# Patient Record
Sex: Male | Born: 1965 | Race: White | Hispanic: No | Smoking: Current every day smoker
Health system: Southern US, Community
[De-identification: ages and names within clinical notes are randomized; demographics above are authoritative.]

## PROBLEM LIST (undated history)

## (undated) ENCOUNTER — Emergency Department (HOSPITAL_BASED_OUTPATIENT_CLINIC_OR_DEPARTMENT_OTHER): Admission: EM | Payer: Medicare Other | Source: Home / Self Care

## (undated) DIAGNOSIS — T50901A Poisoning by unspecified drugs, medicaments and biological substances, accidental (unintentional), initial encounter: Secondary | ICD-10-CM

## (undated) DIAGNOSIS — R7301 Impaired fasting glucose: Secondary | ICD-10-CM

## (undated) DIAGNOSIS — K219 Gastro-esophageal reflux disease without esophagitis: Secondary | ICD-10-CM

## (undated) DIAGNOSIS — S0990XA Unspecified injury of head, initial encounter: Secondary | ICD-10-CM

## (undated) DIAGNOSIS — C342 Malignant neoplasm of middle lobe, bronchus or lung: Secondary | ICD-10-CM

## (undated) DIAGNOSIS — F319 Bipolar disorder, unspecified: Secondary | ICD-10-CM

## (undated) DIAGNOSIS — F32A Depression, unspecified: Secondary | ICD-10-CM

## (undated) DIAGNOSIS — R059 Cough, unspecified: Secondary | ICD-10-CM

## (undated) DIAGNOSIS — E785 Hyperlipidemia, unspecified: Secondary | ICD-10-CM

## (undated) DIAGNOSIS — D649 Anemia, unspecified: Secondary | ICD-10-CM

## (undated) DIAGNOSIS — C3490 Malignant neoplasm of unspecified part of unspecified bronchus or lung: Secondary | ICD-10-CM

## (undated) DIAGNOSIS — F329 Major depressive disorder, single episode, unspecified: Secondary | ICD-10-CM

## (undated) DIAGNOSIS — R05 Cough: Secondary | ICD-10-CM

## (undated) DIAGNOSIS — Z923 Personal history of irradiation: Secondary | ICD-10-CM

## (undated) DIAGNOSIS — J189 Pneumonia, unspecified organism: Secondary | ICD-10-CM

## (undated) DIAGNOSIS — G893 Neoplasm related pain (acute) (chronic): Secondary | ICD-10-CM

## (undated) DIAGNOSIS — C801 Malignant (primary) neoplasm, unspecified: Secondary | ICD-10-CM

## (undated) HISTORY — PX: UPPER GASTROINTESTINAL ENDOSCOPY: SHX188

## (undated) HISTORY — DX: Gastro-esophageal reflux disease without esophagitis: K21.9

## (undated) HISTORY — PX: KNEE SURGERY: SHX244

## (undated) HISTORY — PX: INGUINAL HERNIA REPAIR: SUR1180

## (undated) HISTORY — DX: Personal history of irradiation: Z92.3

## (undated) HISTORY — PX: POLYPECTOMY: SHX149

## (undated) HISTORY — PX: LEG SURGERY: SHX1003

## (undated) HISTORY — DX: Major depressive disorder, single episode, unspecified: F32.9

## (undated) HISTORY — PX: COLONOSCOPY: SHX174

## (undated) HISTORY — DX: Hyperlipidemia, unspecified: E78.5

## (undated) HISTORY — PX: OTHER SURGICAL HISTORY: SHX169

## (undated) HISTORY — DX: Bipolar disorder, unspecified: F31.9

## (undated) HISTORY — PX: BACK SURGERY: SHX140

## (undated) HISTORY — DX: Depression, unspecified: F32.A

## (undated) HISTORY — PX: UMBILICAL HERNIA REPAIR: SHX196

## (undated) HISTORY — PX: CRANIOTOMY: SHX93

---

## 1997-06-09 HISTORY — PX: SUBDURAL HEMATOMA EVACUATION VIA CRANIOTOMY: SUR319

## 1997-11-18 ENCOUNTER — Inpatient Hospital Stay (HOSPITAL_COMMUNITY): Admission: EM | Admit: 1997-11-18 | Discharge: 1997-11-23 | Payer: Self-pay | Admitting: Emergency Medicine

## 2005-04-28 ENCOUNTER — Encounter: Admission: RE | Admit: 2005-04-28 | Discharge: 2005-04-28 | Payer: Self-pay | Admitting: Orthopedic Surgery

## 2005-05-06 ENCOUNTER — Encounter (INDEPENDENT_AMBULATORY_CARE_PROVIDER_SITE_OTHER): Payer: Self-pay | Admitting: Specialist

## 2005-05-06 ENCOUNTER — Ambulatory Visit (HOSPITAL_COMMUNITY): Admission: RE | Admit: 2005-05-06 | Discharge: 2005-05-07 | Payer: Self-pay | Admitting: Orthopedic Surgery

## 2006-04-09 ENCOUNTER — Ambulatory Visit: Payer: Self-pay | Admitting: Internal Medicine

## 2007-01-20 ENCOUNTER — Telehealth (INDEPENDENT_AMBULATORY_CARE_PROVIDER_SITE_OTHER): Payer: Self-pay | Admitting: *Deleted

## 2007-04-02 ENCOUNTER — Encounter: Payer: Self-pay | Admitting: Internal Medicine

## 2007-04-13 ENCOUNTER — Ambulatory Visit: Payer: Self-pay | Admitting: Internal Medicine

## 2007-04-13 DIAGNOSIS — F319 Bipolar disorder, unspecified: Secondary | ICD-10-CM

## 2007-04-13 DIAGNOSIS — K209 Esophagitis, unspecified without bleeding: Secondary | ICD-10-CM | POA: Insufficient documentation

## 2007-04-13 DIAGNOSIS — K439 Ventral hernia without obstruction or gangrene: Secondary | ICD-10-CM | POA: Insufficient documentation

## 2007-04-13 DIAGNOSIS — E8881 Metabolic syndrome: Secondary | ICD-10-CM

## 2007-04-17 LAB — CONVERTED CEMR LAB
Basophils Absolute: 0 10*3/uL (ref 0.0–0.1)
Basophils Relative: 0 % (ref 0.0–1.0)
Eosinophils Absolute: 0.2 10*3/uL (ref 0.0–0.6)
Eosinophils Relative: 2 % (ref 0.0–5.0)
HCT: 41 % (ref 39.0–52.0)
Hemoglobin: 14.2 g/dL (ref 13.0–17.0)
Hgb A1c MFr Bld: 5.6 % (ref 4.6–6.0)
MCHC: 34.6 g/dL (ref 30.0–36.0)
MCV: 96.1 fL (ref 78.0–100.0)
Monocytes Absolute: 0.4 10*3/uL (ref 0.2–0.7)
Monocytes Relative: 4.2 % (ref 3.0–11.0)
Neutro Abs: 6.8 10*3/uL (ref 1.4–7.7)
Neutrophils Relative %: 73.9 % (ref 43.0–77.0)
Platelets: 240 10*3/uL (ref 150–400)
RBC: 4.26 M/uL (ref 4.22–5.81)
RDW: 14.1 % (ref 11.5–14.6)
WBC: 9.3 10*3/uL (ref 4.5–10.5)

## 2007-04-19 ENCOUNTER — Encounter (INDEPENDENT_AMBULATORY_CARE_PROVIDER_SITE_OTHER): Payer: Self-pay | Admitting: *Deleted

## 2007-04-30 ENCOUNTER — Ambulatory Visit: Payer: Self-pay | Admitting: Gastroenterology

## 2007-05-10 ENCOUNTER — Ambulatory Visit: Payer: Self-pay | Admitting: Gastroenterology

## 2007-05-20 ENCOUNTER — Ambulatory Visit: Payer: Self-pay | Admitting: Gastroenterology

## 2007-05-25 ENCOUNTER — Ambulatory Visit: Payer: Self-pay | Admitting: Gastroenterology

## 2007-06-22 ENCOUNTER — Ambulatory Visit: Payer: Self-pay | Admitting: Gastroenterology

## 2007-06-22 ENCOUNTER — Encounter: Payer: Self-pay | Admitting: Gastroenterology

## 2007-07-05 ENCOUNTER — Ambulatory Visit: Payer: Self-pay | Admitting: Gastroenterology

## 2007-07-15 ENCOUNTER — Ambulatory Visit: Payer: Self-pay | Admitting: Internal Medicine

## 2007-07-23 ENCOUNTER — Encounter (INDEPENDENT_AMBULATORY_CARE_PROVIDER_SITE_OTHER): Payer: Self-pay | Admitting: *Deleted

## 2007-07-23 LAB — CONVERTED CEMR LAB
Direct LDL: 144 mg/dL
HDL: 25.9 mg/dL — ABNORMAL LOW (ref >39.0)
Total CHOL/HDL Ratio: 8.6
Triglycerides: 294 mg/dL (ref 0–149)
VLDL: 59 mg/dL — ABNORMAL HIGH (ref 0–40)

## 2007-08-20 ENCOUNTER — Ambulatory Visit: Payer: Self-pay | Admitting: Gastroenterology

## 2007-08-25 ENCOUNTER — Encounter: Payer: Self-pay | Admitting: Internal Medicine

## 2007-08-26 ENCOUNTER — Ambulatory Visit: Payer: Self-pay | Admitting: Internal Medicine

## 2007-08-26 DIAGNOSIS — E785 Hyperlipidemia, unspecified: Secondary | ICD-10-CM | POA: Insufficient documentation

## 2007-09-01 ENCOUNTER — Telehealth: Payer: Self-pay | Admitting: Internal Medicine

## 2007-09-01 ENCOUNTER — Ambulatory Visit: Payer: Self-pay | Admitting: Cardiology

## 2007-09-02 ENCOUNTER — Encounter (INDEPENDENT_AMBULATORY_CARE_PROVIDER_SITE_OTHER): Payer: Self-pay | Admitting: *Deleted

## 2007-09-03 ENCOUNTER — Ambulatory Visit: Payer: Self-pay

## 2007-09-03 ENCOUNTER — Encounter: Payer: Self-pay | Admitting: Internal Medicine

## 2007-09-23 ENCOUNTER — Ambulatory Visit: Payer: Self-pay | Admitting: Internal Medicine

## 2007-09-23 ENCOUNTER — Encounter (INDEPENDENT_AMBULATORY_CARE_PROVIDER_SITE_OTHER): Payer: Self-pay | Admitting: *Deleted

## 2007-09-23 LAB — CONVERTED CEMR LAB: OCCULT 2: NEGATIVE

## 2007-10-12 ENCOUNTER — Ambulatory Visit: Payer: Self-pay | Admitting: Internal Medicine

## 2007-10-12 DIAGNOSIS — M722 Plantar fascial fibromatosis: Secondary | ICD-10-CM

## 2007-10-12 DIAGNOSIS — E781 Pure hyperglyceridemia: Secondary | ICD-10-CM

## 2007-10-13 ENCOUNTER — Encounter: Payer: Self-pay | Admitting: Internal Medicine

## 2007-10-19 ENCOUNTER — Encounter: Payer: Self-pay | Admitting: Internal Medicine

## 2007-10-20 ENCOUNTER — Telehealth (INDEPENDENT_AMBULATORY_CARE_PROVIDER_SITE_OTHER): Payer: Self-pay | Admitting: *Deleted

## 2007-11-15 ENCOUNTER — Telehealth (INDEPENDENT_AMBULATORY_CARE_PROVIDER_SITE_OTHER): Payer: Self-pay | Admitting: *Deleted

## 2008-02-09 ENCOUNTER — Telehealth (INDEPENDENT_AMBULATORY_CARE_PROVIDER_SITE_OTHER): Payer: Self-pay | Admitting: *Deleted

## 2008-02-15 ENCOUNTER — Telehealth (INDEPENDENT_AMBULATORY_CARE_PROVIDER_SITE_OTHER): Payer: Self-pay | Admitting: *Deleted

## 2008-05-09 ENCOUNTER — Telehealth (INDEPENDENT_AMBULATORY_CARE_PROVIDER_SITE_OTHER): Payer: Self-pay | Admitting: *Deleted

## 2008-05-19 ENCOUNTER — Telehealth (INDEPENDENT_AMBULATORY_CARE_PROVIDER_SITE_OTHER): Payer: Self-pay | Admitting: *Deleted

## 2008-07-25 ENCOUNTER — Ambulatory Visit: Payer: Self-pay | Admitting: Gastroenterology

## 2008-08-08 ENCOUNTER — Ambulatory Visit: Payer: Self-pay | Admitting: Gastroenterology

## 2008-08-08 ENCOUNTER — Encounter: Payer: Self-pay | Admitting: Gastroenterology

## 2008-08-09 ENCOUNTER — Telehealth: Payer: Self-pay | Admitting: Gastroenterology

## 2008-08-11 ENCOUNTER — Encounter: Payer: Self-pay | Admitting: Gastroenterology

## 2008-08-18 ENCOUNTER — Telehealth (INDEPENDENT_AMBULATORY_CARE_PROVIDER_SITE_OTHER): Payer: Self-pay | Admitting: *Deleted

## 2008-08-21 ENCOUNTER — Encounter: Payer: Self-pay | Admitting: Gastroenterology

## 2008-08-28 ENCOUNTER — Telehealth (INDEPENDENT_AMBULATORY_CARE_PROVIDER_SITE_OTHER): Payer: Self-pay | Admitting: *Deleted

## 2008-08-30 ENCOUNTER — Ambulatory Visit: Payer: Self-pay | Admitting: Internal Medicine

## 2008-08-30 DIAGNOSIS — R05 Cough: Secondary | ICD-10-CM | POA: Insufficient documentation

## 2008-08-30 DIAGNOSIS — K2270 Barrett's esophagus without dysplasia: Secondary | ICD-10-CM

## 2008-09-06 ENCOUNTER — Encounter (INDEPENDENT_AMBULATORY_CARE_PROVIDER_SITE_OTHER): Payer: Self-pay | Admitting: *Deleted

## 2008-09-11 ENCOUNTER — Ambulatory Visit: Payer: Self-pay | Admitting: Internal Medicine

## 2008-09-13 ENCOUNTER — Encounter (INDEPENDENT_AMBULATORY_CARE_PROVIDER_SITE_OTHER): Payer: Self-pay | Admitting: *Deleted

## 2008-10-23 ENCOUNTER — Telehealth (INDEPENDENT_AMBULATORY_CARE_PROVIDER_SITE_OTHER): Payer: Self-pay | Admitting: *Deleted

## 2008-10-25 ENCOUNTER — Ambulatory Visit: Payer: Self-pay | Admitting: Internal Medicine

## 2008-10-25 DIAGNOSIS — R498 Other voice and resonance disorders: Secondary | ICD-10-CM | POA: Insufficient documentation

## 2008-10-25 DIAGNOSIS — I479 Paroxysmal tachycardia, unspecified: Secondary | ICD-10-CM

## 2008-10-26 LAB — CONVERTED CEMR LAB: TSH: 1.16 microintl units/mL (ref 0.35–5.50)

## 2008-10-27 ENCOUNTER — Encounter (INDEPENDENT_AMBULATORY_CARE_PROVIDER_SITE_OTHER): Payer: Self-pay | Admitting: *Deleted

## 2008-10-30 ENCOUNTER — Encounter: Payer: Self-pay | Admitting: Internal Medicine

## 2008-11-02 ENCOUNTER — Telehealth (INDEPENDENT_AMBULATORY_CARE_PROVIDER_SITE_OTHER): Payer: Self-pay | Admitting: *Deleted

## 2008-11-04 ENCOUNTER — Encounter (INDEPENDENT_AMBULATORY_CARE_PROVIDER_SITE_OTHER): Payer: Self-pay | Admitting: *Deleted

## 2008-11-04 LAB — CONVERTED CEMR LAB
Catecholamines Tot(E+NE) 24 Hr U: 0.069 mg/24hr
Dopamine 24 Hr Urine: 299 mcg/24hr (ref <500)
Epinephrine 24 Hr Urine: 3 mcg/24hr (ref <20)
Metaneph Total, Ur: 716 ug/24hr (ref 182–739)
Metanephrines, Ur: 145 (ref 58–203)
Norepinephrine 24 Hr Urine: 66 mcg/24hr (ref <80)
Normetanephrine, 24H Ur: 571 (ref 88–649)

## 2008-12-08 ENCOUNTER — Ambulatory Visit: Payer: Self-pay | Admitting: Internal Medicine

## 2008-12-16 LAB — CONVERTED CEMR LAB
Cholesterol: 248 mg/dL — ABNORMAL HIGH (ref 0–200)
Direct LDL: 148.6 mg/dL
HDL: 28.5 mg/dL — ABNORMAL LOW (ref >39.00)
Total CHOL/HDL Ratio: 9
Triglycerides: 407 mg/dL — ABNORMAL HIGH (ref 0.0–149.0)
VLDL: 81.4 mg/dL — ABNORMAL HIGH (ref 0.0–40.0)

## 2008-12-18 ENCOUNTER — Ambulatory Visit: Payer: Self-pay | Admitting: Gastroenterology

## 2008-12-18 DIAGNOSIS — A630 Anogenital (venereal) warts: Secondary | ICD-10-CM

## 2008-12-18 DIAGNOSIS — K432 Incisional hernia without obstruction or gangrene: Secondary | ICD-10-CM | POA: Insufficient documentation

## 2008-12-18 DIAGNOSIS — K648 Other hemorrhoids: Secondary | ICD-10-CM | POA: Insufficient documentation

## 2008-12-19 ENCOUNTER — Encounter (INDEPENDENT_AMBULATORY_CARE_PROVIDER_SITE_OTHER): Payer: Self-pay | Admitting: *Deleted

## 2008-12-27 ENCOUNTER — Telehealth: Payer: Self-pay | Admitting: Gastroenterology

## 2009-01-08 ENCOUNTER — Telehealth: Payer: Self-pay | Admitting: Gastroenterology

## 2009-01-24 ENCOUNTER — Encounter: Payer: Self-pay | Admitting: Gastroenterology

## 2009-01-31 ENCOUNTER — Ambulatory Visit: Payer: Self-pay | Admitting: Internal Medicine

## 2009-01-31 DIAGNOSIS — T887XXA Unspecified adverse effect of drug or medicament, initial encounter: Secondary | ICD-10-CM | POA: Insufficient documentation

## 2009-02-13 ENCOUNTER — Encounter (INDEPENDENT_AMBULATORY_CARE_PROVIDER_SITE_OTHER): Payer: Self-pay | Admitting: *Deleted

## 2009-02-13 LAB — CONVERTED CEMR LAB
BUN: 13 mg/dL (ref 6–23)
Creatinine, Ser: 1.2 mg/dL (ref 0.4–1.5)
Creatinine,U: 181.5 mg/dL
Hgb A1c MFr Bld: 6.2 % (ref 4.6–6.5)
Microalb Creat Ratio: 1.1 mg/g (ref 0.0–30.0)
Microalb, Ur: 0.2 mg/dL (ref 0.0–1.9)
Potassium: 4 meq/L (ref 3.5–5.1)

## 2009-02-21 ENCOUNTER — Encounter: Payer: Self-pay | Admitting: Internal Medicine

## 2009-02-27 ENCOUNTER — Encounter: Payer: Self-pay | Admitting: Gastroenterology

## 2009-03-08 ENCOUNTER — Encounter: Payer: Self-pay | Admitting: Gastroenterology

## 2009-03-15 ENCOUNTER — Ambulatory Visit (HOSPITAL_COMMUNITY): Admission: RE | Admit: 2009-03-15 | Discharge: 2009-03-15 | Payer: Self-pay | Admitting: General Surgery

## 2009-03-15 ENCOUNTER — Encounter (INDEPENDENT_AMBULATORY_CARE_PROVIDER_SITE_OTHER): Payer: Self-pay | Admitting: General Surgery

## 2009-03-17 ENCOUNTER — Emergency Department (HOSPITAL_COMMUNITY): Admission: EM | Admit: 2009-03-17 | Discharge: 2009-03-17 | Payer: Self-pay | Admitting: Emergency Medicine

## 2009-03-23 ENCOUNTER — Encounter: Payer: Self-pay | Admitting: Internal Medicine

## 2009-03-28 ENCOUNTER — Encounter: Payer: Self-pay | Admitting: Internal Medicine

## 2009-04-09 ENCOUNTER — Telehealth (INDEPENDENT_AMBULATORY_CARE_PROVIDER_SITE_OTHER): Payer: Self-pay | Admitting: *Deleted

## 2009-05-10 ENCOUNTER — Telehealth (INDEPENDENT_AMBULATORY_CARE_PROVIDER_SITE_OTHER): Payer: Self-pay | Admitting: *Deleted

## 2009-05-30 ENCOUNTER — Encounter: Payer: Self-pay | Admitting: Internal Medicine

## 2009-06-29 ENCOUNTER — Ambulatory Visit (HOSPITAL_COMMUNITY): Admission: RE | Admit: 2009-06-29 | Discharge: 2009-07-03 | Payer: Self-pay | Admitting: General Surgery

## 2009-07-10 ENCOUNTER — Telehealth (INDEPENDENT_AMBULATORY_CARE_PROVIDER_SITE_OTHER): Payer: Self-pay | Admitting: *Deleted

## 2009-07-10 ENCOUNTER — Telehealth: Payer: Self-pay | Admitting: Internal Medicine

## 2009-07-23 ENCOUNTER — Telehealth (INDEPENDENT_AMBULATORY_CARE_PROVIDER_SITE_OTHER): Payer: Self-pay | Admitting: *Deleted

## 2009-07-25 ENCOUNTER — Encounter: Payer: Self-pay | Admitting: Gastroenterology

## 2009-07-30 ENCOUNTER — Telehealth: Payer: Self-pay | Admitting: Gastroenterology

## 2009-08-14 ENCOUNTER — Ambulatory Visit: Payer: Self-pay | Admitting: Internal Medicine

## 2009-08-15 ENCOUNTER — Encounter: Payer: Self-pay | Admitting: Gastroenterology

## 2009-08-23 LAB — CONVERTED CEMR LAB: Hgb A1c MFr Bld: 5.9 % (ref 4.6–6.5)

## 2009-10-12 ENCOUNTER — Telehealth (INDEPENDENT_AMBULATORY_CARE_PROVIDER_SITE_OTHER): Payer: Self-pay | Admitting: *Deleted

## 2010-01-09 ENCOUNTER — Telehealth (INDEPENDENT_AMBULATORY_CARE_PROVIDER_SITE_OTHER): Payer: Self-pay | Admitting: *Deleted

## 2010-03-04 ENCOUNTER — Telehealth: Payer: Self-pay | Admitting: Gastroenterology

## 2010-03-25 ENCOUNTER — Telehealth: Payer: Self-pay | Admitting: Gastroenterology

## 2010-04-03 ENCOUNTER — Encounter: Payer: Self-pay | Admitting: Gastroenterology

## 2010-04-16 ENCOUNTER — Telehealth: Payer: Self-pay | Admitting: Internal Medicine

## 2010-04-16 ENCOUNTER — Telehealth: Payer: Self-pay | Admitting: Gastroenterology

## 2010-04-16 ENCOUNTER — Encounter: Payer: Self-pay | Admitting: Internal Medicine

## 2010-04-26 ENCOUNTER — Telehealth (INDEPENDENT_AMBULATORY_CARE_PROVIDER_SITE_OTHER): Payer: Self-pay | Admitting: *Deleted

## 2010-07-07 LAB — CONVERTED CEMR LAB
ALT: 60 units/L — ABNORMAL HIGH (ref 0–53)
AST: 36 units/L (ref 0–37)
Albumin: 4.1 g/dL (ref 3.5–5.2)
Alkaline Phosphatase: 64 units/L (ref 39–117)
Bilirubin, Direct: 0.1 mg/dL (ref 0.0–0.3)
Cholesterol: 246 mg/dL — ABNORMAL HIGH (ref 0–200)
Direct LDL: 145.6 mg/dL
HDL: 33 mg/dL — ABNORMAL LOW (ref >39.00)
Hgb A1c MFr Bld: 6.1 % (ref 4.6–6.5)
TSH: 1.27 microintl units/mL (ref 0.35–5.50)
Total Bilirubin: 0.7 mg/dL (ref 0.3–1.2)
Total CHOL/HDL Ratio: 7
Total Protein: 7.4 g/dL (ref 6.0–8.3)
Triglycerides: 423 mg/dL — ABNORMAL HIGH (ref 0.0–149.0)
VLDL: 84.6 mg/dL — ABNORMAL HIGH (ref 0.0–40.0)

## 2010-07-09 NOTE — Progress Notes (Signed)
Summary: Tricor refill  Phone Note Call from Patient Call back at Home Phone [phone]   Caller: Mother--[name] Summary of Call: Patient mother left message on triage requesting refill of patient med--Tricor. Last refill called for by Chrae on 10-12-09. Please advise. Initial call taken by: Lucious Groves CMA,  January 09, 2010 4:51 PM  Follow-up for Phone Call        OK X 90 days Follow-up by: Marga Melnick MD,  January 09, 2010 4:56 PM  Additional Follow-up for Phone Call Additional follow up Details #1::        Called [phone] and requested refill through patient assistance. Meds will be sent to our office in 7-10 business days Additional Follow-up by: Shonna Chock CMA,  January 10, 2010 8:28 AM     Appended Document: Tricor refill Meds placed at the front for pick-up, patient aware

## 2010-07-09 NOTE — Progress Notes (Signed)
Summary: Medication refill   Phone Note Call from Patient Call back at Home Phone [phone]   Caller: Patient Call For: Dr. Arlyce Dice Reason for Call: Refill Medication Summary of Call: Pt needs a refill on his Dexilant Initial call taken by: Karna Christmas,  July 30, 2009 11:38 AM  Follow-up for Phone Call        Called pt to inform sent in rx Follow-up by: Merri Ray CMA Duncan Dull),  July 30, 2009 1:17 PM    Prescriptions: DEXILANT 60 MG CPDR (DEXLANSOPRAZOLE) 1 by mouth once daily 30 min before first meal  #30 x 6   Entered by:   Merri Ray CMA (AAMA)   Authorized by:   Louis Meckel MD   Signed by:   Merri Ray CMA (AAMA) on 07/30/2009   Method used:   Electronically to        [facility]  [address] [address] (retail)       [address]       [place], [place]  [address]       Ph: [phone]       Fax: [phone]   RxID:   [id number]

## 2010-07-09 NOTE — Medication Information (Signed)
Summary: [facility] Patient Assistance Program  [facility] Patient Assistance Program   Imported By: Lester Oxford 04/08/2010 08:53:16  _____________________________________________________________________  External Attachment:    Type:   Image     Comment:   External Document

## 2010-07-09 NOTE — Progress Notes (Signed)
Summary: Medication   Phone Note Call from Patient Call back at Home Phone [phone]   Caller: Mother [name] Call For: Dr. Arlyce Dice Summary of Call: Needs a script for Dexilant...Marland KitchenMarland KitchenFax# [phone] Attn: Processing Dept. Initial call taken by: Karna Christmas,  April 16, 2010 8:52 AM  Follow-up for Phone Call        Will fax to PAP, the number provided Follow-up by: Merri Ray CMA Duncan Dull),  April 16, 2010 9:07 AM  Additional Follow-up for Phone Call Additional follow up Details #1::        Called pt, aware Med faxed    Prescriptions: DEXILANT 60 MG CPDR (DEXLANSOPRAZOLE) 1 by mouth once daily 30 min before first meal  #90 x 4   Entered by:   Merri Ray CMA (AAMA)   Authorized by:   Louis Meckel MD   Signed by:   Merri Ray CMA (AAMA) on 04/16/2010   Method used:   Printed then faxed to ...       [facility]  [address] (retail)       [address]       [place], [place]  [address]       Ph: [phone]       Fax: [phone]   RxID:   [id number]

## 2010-07-09 NOTE — Letter (Signed)
Summary: [facility]   Imported By: Sherian Rein 10/08/2009 13:13:16  _____________________________________________________________________  External Attachment:    Type:   Image     Comment:   External Document

## 2010-07-09 NOTE — Progress Notes (Signed)
Summary: PAP [employer]   Phone Note Call from Patient   Summary of Call: Pt came by and dropped off paperwork for PAP for Dr Arlyce Dice to sign will mail to [name] in self addressed envelope as soon as its signed Initial call taken by: Merri Ray CMA Duncan Dull),  March 25, 2010 1:59 PM

## 2010-07-09 NOTE — Progress Notes (Signed)
Summary: Pt assistance meds  Phone Note Other Incoming   Summary of Call: Patient [name] [name] has arrived from patient assistance. I called to notify the patient mother and line was busy. Lucious Groves CMA  April 26, 2010 1:06 PM   Same as above. Lucious Groves CMA  April 26, 2010 1:28 PM   Follow-up for Phone Call        Patient's mom aware Tricor avaliable for pick up Follow-up by: Shonna Chock CMA,  April 26, 2010 1:40 PM

## 2010-07-09 NOTE — Progress Notes (Signed)
Summary: QUESTION ABOUT TRICOR  Phone Note Call from Patient   Caller: Patient Summary of Call: FATHER WANTS TO KNOW IF THERE IS ANYWAY THAT HIS SONS TRICOR CAN BE MAILED TO HIM INSTEAD OF HIM HAVING TO COME AND PICK IT UP BECAUSE ITS A 30 MIN DRIVE. Initial call taken by: Lavell Islam,  April 26, 2010 3:52 PM  Follow-up for Phone Call        spoke w/ patient father informed that no problem mailing medication........Marland KitchenDoristine Devoid CMA  April 26, 2010 5:07 PM

## 2010-07-09 NOTE — Medication Information (Signed)
Summary: Patient Assistance Form/[employer]  Patient Assistance Form/[facility]   Imported By: Lanelle Bal 04/29/2010 11:26:27  _____________________________________________________________________  External Attachment:    Type:   Image     Comment:   External Document

## 2010-07-09 NOTE — Letter (Signed)
Summary: Psychological Eval/[facility]  Psychological Eval/[facility]   Imported By: Lanelle Bal 07/02/2009 10:11:47  _____________________________________________________________________  External Attachment:    Type:   Image     Comment:   External Document

## 2010-07-09 NOTE — Progress Notes (Signed)
Summary: FORM FROM [facility] [employer]  Phone Note Call from Patient Call back at Home Phone [phone]   Caller: [facility] Summary of Call: PATIENT'S MOTHER [facility] BROUGHT IN FORM FROM [facility] LAW GROUP FOR DR Chantae Soo TO COMPLETE SINCE PATIENT'S DISABILITY CLAIM WAS DENIED--WILL TAKE BACK TO [place]IA IN PLASTIC SLEEVE  WHEN COMPLETED, PLEASE FAX TO Lisabeth Pick [phone] PER [name] Grill   Initial call taken by: Jerolyn Shin,  July 10, 2009 2:31 PM  Follow-up for Phone Call        form placed on ledge to be completed...............Marland KitchenFelecia Deloach CMA  July 10, 2009 4:30 PM   Additional Follow-up for Phone Call Additional follow up Details #1::        left up front Additional Follow-up by: Marga Melnick MD,  July 10, 2009 6:01 PM

## 2010-07-09 NOTE — Progress Notes (Signed)
Summary: ORDER TRICOR FROM [facility]  Phone Note Call from Patient Call back at Home Phone [phone]   Caller: MOM = [name] Summary of Call: MOM [facility]  SAID THAT PATIENT NEEDS TO HAVE HIS TRICOR ORDERED FROM [facility]--WHEN WE RECEIVE IT Dianah Field TO PICK IT UP AT [phone] Initial call taken by: Jerolyn Shin,  July 10, 2009 2:32 PM  Follow-up for Phone Call        Called [phone] [facility] Patient Assistance Number to order Ticor through the automatic system. Recording stated med should be delivered in 7-10 business days Follow-up by: Shonna Chock,  July 11, 2009 8:34 AM     Appended Document: ORDER TRICOR FROM [facility] Medication came in # 90, placed at the front.

## 2010-07-09 NOTE — Letter (Signed)
Summary: [facility]   Imported By: Sherian Rein 08/08/2009 12:26:34  _____________________________________________________________________  External Attachment:    Type:   Image     Comment:   External Document

## 2010-07-09 NOTE — Progress Notes (Signed)
Summary: Records request from [facility]  Request for records received from [facility]. Request forwarded to [employer]. Dena Chavis  July 23, 2009 1:02 PM

## 2010-07-09 NOTE — Progress Notes (Signed)
Summary: Refill  Medications Added DEXILANT 60 MG CPDR (DEXLANSOPRAZOLE) 1 by mouth once daily 30 min before first meal       Phone Note Call from Patient Call back at Work Phone [phone]   Call For: Dr Arlyce Dice Reason for Call: Refill Medication Summary of Call: Needs his dexilant refill sent to his house.  Unsure which pharmacy he uses-whatever was the last one we sent it to is ok.  Initial call taken by: Leanor Kail [facility],  March 04, 2010 10:48 AM  Follow-up for Phone Call        Called pt needs to rx mailed to his home so he can fill out his new paperwork for [employer] PAP for his medication. Follow-up by: Merri Ray CMA Duncan Dull),  March 04, 2010 1:33 PM    New/Updated Medications: DEXILANT 60 MG CPDR (DEXLANSOPRAZOLE) 1 by mouth once daily 30 min before first meal Prescriptions: DEXILANT 60 MG CPDR (DEXLANSOPRAZOLE) 1 by mouth once daily 30 min before first meal  #90 x 4   Entered by:   Merri Ray CMA (AAMA)   Authorized by:   Louis Meckel MD   Signed by:   Merri Ray CMA (AAMA) on 03/04/2010   Method used:   Print then Give to Patient   RxID:   [id number]

## 2010-07-09 NOTE — Progress Notes (Signed)
Summary: Refill Request  Phone Note Call from Patient Call back at Home Phone [phone]   Caller: Patient Summary of Call: Message left on VM: Needs Tricor through [facility]   Called  [facility]:  Requested refill throgh automatic system ([phone]), recording stated med will be shipped in 7-10 days./[facility]  Oct 12, 2009 1:16 PM

## 2010-07-09 NOTE — Progress Notes (Signed)
Summary: Tricor pt assist.  Phone Note Call from Patient Call back at Home Phone [phone]   Caller: Mother--[name] Summary of Call: Pt mother left message on triage requesting that we call [facility] Patient Assistance for pt Tricor refill. Called [phone] option 3, then option 2 and requested Tricor refill.  Automated system cannot refill due to patient having used maximum refills/application expired. New app will be faxed to our office at [phone]. Initial call taken by: Lucious Groves CMA,  April 16, 2010 9:48 AM  Follow-up for Phone Call        Patient mother notified of the above and will come to our office to complete their portion of the app with patient proof of no income. MD has completed his portion. Follow-up by: Lucious Groves CMA,  April 16, 2010 11:55 AM    Prescriptions: TRICOR 145 MG  TABS (FENOFIBRATE) TAKE 1 DAILY AT NIGHT  #90 x 3   Entered by:   Lucious Groves CMA   Authorized by:   Marga Melnick MD   Signed by:   Lucious Groves CMA on 04/16/2010   Method used:   Print then Give to Patient   RxID:   [id number]   Appended Document: Tricor pt assist. Mother did finish paperwork and brought proper attachment, forms faxed on patient behalf.

## 2010-07-16 ENCOUNTER — Telehealth (INDEPENDENT_AMBULATORY_CARE_PROVIDER_SITE_OTHER): Payer: Self-pay | Admitting: *Deleted

## 2010-07-17 ENCOUNTER — Telehealth (INDEPENDENT_AMBULATORY_CARE_PROVIDER_SITE_OTHER): Payer: Self-pay | Admitting: *Deleted

## 2010-07-22 ENCOUNTER — Other Ambulatory Visit: Payer: Self-pay | Admitting: Family Medicine

## 2010-07-22 ENCOUNTER — Other Ambulatory Visit (INDEPENDENT_AMBULATORY_CARE_PROVIDER_SITE_OTHER): Payer: Self-pay

## 2010-07-22 ENCOUNTER — Encounter (INDEPENDENT_AMBULATORY_CARE_PROVIDER_SITE_OTHER): Payer: Self-pay | Admitting: *Deleted

## 2010-07-22 DIAGNOSIS — E785 Hyperlipidemia, unspecified: Secondary | ICD-10-CM

## 2010-07-22 DIAGNOSIS — T887XXA Unspecified adverse effect of drug or medicament, initial encounter: Secondary | ICD-10-CM

## 2010-07-22 LAB — LIPID PANEL
Cholesterol: 233 mg/dL — ABNORMAL HIGH (ref 0–200)
HDL: 26 mg/dL — ABNORMAL LOW (ref >39.00)
Total CHOL/HDL Ratio: 9
Triglycerides: 493 mg/dL — ABNORMAL HIGH (ref 0.0–149.0)
VLDL: 98.6 mg/dL — ABNORMAL HIGH (ref 0.0–40.0)

## 2010-07-22 LAB — BUN: BUN: 14 mg/dL (ref 6–23)

## 2010-07-22 LAB — HEMOGLOBIN A1C: Hgb A1c MFr Bld: 5.9 % (ref 4.6–6.5)

## 2010-07-22 LAB — POTASSIUM: Potassium: 4 mEq/L (ref 3.5–5.1)

## 2010-07-22 LAB — LDL CHOLESTEROL, DIRECT: Direct LDL: 138.5 mg/dL

## 2010-07-25 NOTE — Progress Notes (Signed)
Summary: need orders and codes for 2/13--added, sched f/up  Phone Note Call from Patient   Caller: patient's mom Summary of Call: patient's mom says she spoke to Dr [name] and was told to set up a lab to check sugar---(lab work was run earlier by Dr Thomasena Edis at [facility] and she was told his Seraquel could cause sugar to rise)------Does he need just the A1C test or does Dr [name] want more tests and I will need diag codes for all orders please        thanks Initial call taken by: Jerolyn Shin,  July 17, 2010 11:53 AM  Follow-up for Phone Call        fasting lipids, hepatic pattern ,BUN,creat, K+,A1c ( 272.4, 995.20); appt 2-3 days post labs Follow-up by: Marga Melnick MD,  July 17, 2010 3:51 PM  Additional Follow-up for Phone Call Additional follow up Details #1::        entered info in lab appt, scheduled o/v for following Monday 2/20 Additional Follow-up by: Jerolyn Shin,  July 19, 2010 3:42 PM

## 2010-07-25 NOTE — Progress Notes (Addendum)
Summary: Tricor refill  Phone Note Refill Request Message from:  Patient's mom = Maralyn Sago on July 16, 2010 12:16 PM  Refills Requested: Medication #1:  TRICOR 145 MG  TABS TAKE 1 DAILY AT Marissa Calamity says we order this for patient, it arrives here and we call her to pick it up ---call [phone]   (?? see phone note = 11/18)  Initial call taken by: Jerolyn Shin,  July 16, 2010 12:17 PM  Follow-up for Phone Call        I called [phone] and requested refill Follow-up by: Shonna Chock CMA,  July 16, 2010 2:46 PM     Appended Document: Tricor refill Patient's mother aware med is here and ready for pick-up

## 2010-07-29 ENCOUNTER — Ambulatory Visit (INDEPENDENT_AMBULATORY_CARE_PROVIDER_SITE_OTHER): Payer: Self-pay | Admitting: Internal Medicine

## 2010-07-29 ENCOUNTER — Encounter: Payer: Self-pay | Admitting: Internal Medicine

## 2010-07-29 DIAGNOSIS — E781 Pure hyperglyceridemia: Secondary | ICD-10-CM

## 2010-08-06 NOTE — Assessment & Plan Note (Signed)
Summary: to discuss labs///sph   Vital Signs:  Patient profile:   45 year old male Weight:      227.6 pounds BMI:     32.78 Pulse rate:   76 / minute Resp:     15 per minute BP sitting:   102 / 68  (left arm) Cuff size:   large  Vitals Entered By: Shonna Chock CMA (July 29, 2010 8:00 AM) CC: Discuss labs (copy given)    Primary Care Provider:  Marga Melnick, MD  CC:  Discuss labs (copy given) .  History of Present Illness:    Labs reviewed & risks discussed; major immediate risk is Pancreatitis from TG 493. Role of HFCS sugar in raising TG discussed. He reports fatigue, but denies muscle aches, GI upset, abdominal pain, constipation, and diarrhea.  The patient denies the following symptoms: chest pain/pressure, exercise intolerance, dypsnea, palpitations, and pedal edema.  Compliance with medications (by patient report) has been near 100%.  Dietary compliance has been good except for [employer] sodas.  The patient reports no exercise. "Dr Thomasena Edis is taking me off Seroquel because of the TGs".   Current Medications (verified): 1)  Seroquel 400 Mg  Tabs (Quetiapine Fumarate) .Marland Kitchen.. 1 By Mouth Once Daily  (Patient States He Takes 500mg  Daily) 2)  Tricor 145 Mg  Tabs (Fenofibrate) .... Take 1 Daily At Night 3)  Trileptal 300 Mg Tabs (Oxcarbazepine) .... Take 1 Tablet By Two Times A Day 4)  Dexilant 60 Mg Cpdr (Dexlansoprazole) .Marland Kitchen.. 1 By Mouth Once Daily 30 Min Before First Meal  Allergies: 1)  ! Codeine 2)  ! Morphine  Physical Exam  General:  in no acute distress; alert,appropriate and cooperative throughout examination Lungs:  Normal respiratory effort, chest expands symmetrically. Lungs are clear to auscultation, no crackles or wheezes. Heart:  Normal rate and regular rhythm. S1 and S2 normal without gallop, murmur, click, rub . S4 Abdomen:  Bowel sounds positive,abdomen soft and non-tender without masses, organomegaly . Abdomen protuberant with small incisonal hernia  noted. Psych:  memory intact for recent and remote, normally interactive, good eye contact, not anxious appearing, and not depressed appearing.     Impression & Recommendations:  Problem # 1:  HYPERTRIGLYCERIDEMIA (ICD-272.1)  His updated medication list for this problem includes:    Tricor 145 Mg Tabs (Fenofibrate) .Marland Kitchen... Take 1 daily at night  Complete Medication List: 1)  Seroquel 400 Mg Tabs (Quetiapine fumarate) .Marland Kitchen.. 1 by mouth once daily  (patient states he takes 500mg  daily) 2)  Tricor 145 Mg Tabs (Fenofibrate) .... Take 1 daily at night 3)  Trileptal 300 Mg Tabs (Oxcarbazepine) .... Take 1 tablet by two times a day 4)  Dexilant 60 Mg Cpdr (Dexlansoprazole) .Marland Kitchen.. 1 by mouth once daily 30 min before first meal  Patient Instructions: 1)  Consume LESS THAN 40 grams of High Fructose Corn Syrup sugar/ day as discussed. 2)  It is important that you exercise regularly at least 20 minutes 5 times a week. If you develop chest pain, have severe difficulty breathing, or feel very tired , stop exercising immediately and seek medical attention. 3)  Please schedule a follow-up appointment in 4 months. 4)  Hepatic Panel prior to visit, ICD-9:995.20 5)  Lipid Panel prior to visit, ICD-9:272.1   Orders Added: 1)  Est. Patient Level III [[address]]

## 2010-08-25 LAB — CBC
HCT: 44 % (ref 39.0–52.0)
Hemoglobin: 14.4 g/dL (ref 13.0–17.0)
MCHC: 32.8 g/dL (ref 30.0–36.0)
MCV: 94.4 fL (ref 78.0–100.0)
Platelets: 227 10*3/uL (ref 150–400)
RBC: 4.66 MIL/uL (ref 4.22–5.81)
RDW: 15.7 % — ABNORMAL HIGH (ref 11.5–15.5)
WBC: 6.9 10*3/uL (ref 4.0–10.5)

## 2010-08-25 LAB — DIFFERENTIAL
Basophils Absolute: 0.1 10*3/uL (ref 0.0–0.1)
Basophils Relative: 1 % (ref 0–1)
Eosinophils Absolute: 0.1 10*3/uL (ref 0.0–0.7)
Eosinophils Relative: 2 % (ref 0–5)
Lymphocytes Relative: 19 % (ref 12–46)
Lymphs Abs: 1.3 10*3/uL (ref 0.7–4.0)
Monocytes Absolute: 0.5 10*3/uL (ref 0.1–1.0)
Monocytes Relative: 7 % (ref 3–12)
Neutro Abs: 4.9 10*3/uL (ref 1.7–7.7)
Neutrophils Relative %: 71 % (ref 43–77)

## 2010-08-25 LAB — COMPREHENSIVE METABOLIC PANEL
ALT: 85 U/L — ABNORMAL HIGH (ref 0–53)
AST: 63 U/L — ABNORMAL HIGH (ref 0–37)
Albumin: 3.9 g/dL (ref 3.5–5.2)
Alkaline Phosphatase: 64 U/L (ref 39–117)
BUN: 8 mg/dL (ref 6–23)
CO2: 27 mEq/L (ref 19–32)
Calcium: 9.3 mg/dL (ref 8.4–10.5)
Chloride: 109 mEq/L (ref 96–112)
Creatinine, Ser: 1.35 mg/dL (ref 0.4–1.5)
GFR calc Af Amer: >60 mL/min (ref >60)
GFR calc non Af Amer: 57 mL/min — ABNORMAL LOW (ref >60)
Glucose, Bld: 87 mg/dL (ref 70–99)
Potassium: 4.5 mEq/L (ref 3.5–5.1)
Sodium: 141 mEq/L (ref 135–145)
Total Bilirubin: 0.3 mg/dL (ref 0.3–1.2)
Total Protein: 7 g/dL (ref 6.0–8.3)

## 2010-09-03 ENCOUNTER — Encounter: Payer: Self-pay | Admitting: Gastroenterology

## 2010-09-10 NOTE — Letter (Signed)
Summary: Endoscopy Letter  [facility]  [address] [place], [place] [address]   Phone: [phone]  Fax: [phone]      September 03, 2010 MRN: [id number]   [name] [name] [address] [place], [place]  [address]   Dear Mr. [name],   According to your medical record, it is time for you to schedule an Endoscopy. Endoscopic screening is recommended for patients with certain upper digestive tract conditions because of associated increased risk for cancers of the upper digestive system.  This letter has been generated based on the recommendations made at the time of your prior procedure. If you feel that in your particular situation this may no longer apply, please contact our office.  Please call our office at [phone]) to schedule this appointment or to update your records at your earliest convenience.  Thank you for cooperating with [place] to provide you with the very best care possible.   Sincerely,  Barbette Hair. Arlyce Dice, M.D.  [facility] [phone]

## 2010-09-12 LAB — COMPREHENSIVE METABOLIC PANEL
ALT: 62 U/L — ABNORMAL HIGH (ref 0–53)
AST: 32 U/L (ref 0–37)
Albumin: 4.1 g/dL (ref 3.5–5.2)
Alkaline Phosphatase: 48 U/L (ref 39–117)
BUN: 15 mg/dL (ref 6–23)
CO2: 24 mEq/L (ref 19–32)
Calcium: 9.7 mg/dL (ref 8.4–10.5)
Chloride: 104 mEq/L (ref 96–112)
Creatinine, Ser: 1.16 mg/dL (ref 0.4–1.5)
GFR calc Af Amer: >60 mL/min (ref >60)
GFR calc non Af Amer: >60 mL/min (ref >60)
Glucose, Bld: 100 mg/dL — ABNORMAL HIGH (ref 70–99)
Potassium: 4.1 mEq/L (ref 3.5–5.1)
Sodium: 136 mEq/L (ref 135–145)
Total Bilirubin: 0.7 mg/dL (ref 0.3–1.2)
Total Protein: 7.2 g/dL (ref 6.0–8.3)

## 2010-09-12 LAB — URINALYSIS, ROUTINE W REFLEX MICROSCOPIC
Bilirubin Urine: NEGATIVE
Glucose, UA: NEGATIVE mg/dL
Ketones, ur: NEGATIVE mg/dL
Leukocytes, UA: NEGATIVE
Nitrite: NEGATIVE
Protein, ur: NEGATIVE mg/dL
Specific Gravity, Urine: 1.01 (ref 1.005–1.030)
Urobilinogen, UA: 1 mg/dL (ref 0.0–1.0)
pH: 6.5 (ref 5.0–8.0)

## 2010-09-12 LAB — CBC
HCT: 42.7 % (ref 39.0–52.0)
Hemoglobin: 14.5 g/dL (ref 13.0–17.0)
MCHC: 33.9 g/dL (ref 30.0–36.0)
MCV: 94 fL (ref 78.0–100.0)
Platelets: 233 10*3/uL (ref 150–400)
RBC: 4.54 MIL/uL (ref 4.22–5.81)
RDW: 14.7 % (ref 11.5–15.5)
WBC: 6.4 10*3/uL (ref 4.0–10.5)

## 2010-09-12 LAB — DIFFERENTIAL
Basophils Absolute: 0.1 10*3/uL (ref 0.0–0.1)
Basophils Relative: 1 % (ref 0–1)
Eosinophils Absolute: 0.1 10*3/uL (ref 0.0–0.7)
Eosinophils Relative: 2 % (ref 0–5)
Lymphocytes Relative: 24 % (ref 12–46)
Lymphs Abs: 1.5 10*3/uL (ref 0.7–4.0)
Monocytes Absolute: 0.5 10*3/uL (ref 0.1–1.0)
Monocytes Relative: 7 % (ref 3–12)
Neutro Abs: 4.2 10*3/uL (ref 1.7–7.7)
Neutrophils Relative %: 66 % (ref 43–77)

## 2010-09-12 LAB — URINE CULTURE
Colony Count: NO GROWTH
Culture: NO GROWTH

## 2010-09-12 LAB — URINE MICROSCOPIC-ADD ON
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
## 2010-10-22 NOTE — Letter (Signed)
April 30, 2007    [name]. [name], MD,FACP,FCCP  [phone] [place], [place] [address]   RE:  [name] [name], [name] [name]  MRN:  [id number]  /  DOB:  1966-04-04   Dear Dr. [name]:   Upon your kind referral, I had the pleasure of evaluating your patient  and I am pleased to offer my findings.  I saw [name] [name] in the office  today.  Enclosed is a copy of my progress note that details my findings  and recommendations.   Thank you for the opportunity to participate in your patient's care.    Sincerely,      Barbette Hair. Arlyce Dice, MD,FACG  Electronically Signed    RDK/MedQ  DD: 04/30/2007  DT: 04/30/2007  Job #: [id number]

## 2010-10-22 NOTE — Assessment & Plan Note (Signed)
[facility]                         GASTROENTEROLOGY OFFICE NOTE   NAME:Bua, EMRAN MOLZAHN                        MRN:          [id number]  DATE:08/20/2007                            DOB:          Oct 26, 1965    PROBLEM:  1. Reflux.  2. GERD.  3. Barrett's esophagus.   Mr. Torelli has returned for scheduled followup.  He has biopsy-proven  Barrett's esophagus.  On Protonix, he is symptom-free.  He recently  participated in a GERD study and has continued to do well.   ON EXAM:  Pulse 100, blood pressure 110/72, weight 207.   IMPRESSION:  1. GERD.  2. Barrett's esophagus.   RECOMMENDATIONS:  1. Continue Protonix indefinitely.  2. Followup upper endoscopy in approximately a year.     Barbette Hair. Arlyce Dice, MD,FACG  Electronically Signed    RDK/MedQ  DD: 08/20/2007  DT: 08/20/2007  Job #: [id number]   cc:   [name]. [name], MD,FACP,FCCP

## 2010-10-22 NOTE — Letter (Signed)
April 30, 2007    [name] [name]   RE:  [name] [name], [name] [name]  MRN:  [id number]  /  DOB:  1965-12-10   Dear Mr. Pricilla [name]:   It is my pleasure to have treated you recently as a new patient in my  office.  I appreciate your confidence and the opportunity to participate  in your care.   Since I do have a busy inpatient endoscopy schedule and office schedule,  my office hours vary weekly.  I am, however, available for emergency  calls every day through my office.  If I cannot promptly meet an urgent  office appointment, another one of our gastroenterologists will be able  to assist you.   My well-trained staff are prepared to help you at all times.  For  emergencies after office hours, a physician from our gastroenterology  section is always available through my 24-hour answering service.   While you are under my care, I encourage discussion of your questions  and concerns, and I will be happy to return your calls as soon as I am  available.   Once again, I welcome you as a new patient and I look forward to a happy  and healthy relationship.    Sincerely,      Barbette Hair. Arlyce Dice, MD,FACG  Electronically Signed   RDK/MedQ  DD: 04/30/2007  DT: 04/30/2007  Job #: [id number]
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
## 2010-11-14 ENCOUNTER — Encounter: Payer: Self-pay | Admitting: Gastroenterology

## 2010-12-04 ENCOUNTER — Ambulatory Visit (AMBULATORY_SURGERY_CENTER): Payer: Self-pay | Admitting: *Deleted

## 2010-12-04 VITALS — Ht 70.0 in | Wt 220.0 lb

## 2010-12-04 DIAGNOSIS — K2270 Barrett's esophagus without dysplasia: Secondary | ICD-10-CM

## 2010-12-13 ENCOUNTER — Encounter: Payer: Self-pay | Admitting: Internal Medicine

## 2010-12-16 ENCOUNTER — Ambulatory Visit (INDEPENDENT_AMBULATORY_CARE_PROVIDER_SITE_OTHER): Payer: Self-pay | Admitting: Internal Medicine

## 2010-12-16 ENCOUNTER — Encounter: Payer: Self-pay | Admitting: Internal Medicine

## 2010-12-16 DIAGNOSIS — IMO0001 Reserved for inherently not codable concepts without codable children: Secondary | ICD-10-CM

## 2010-12-16 DIAGNOSIS — F319 Bipolar disorder, unspecified: Secondary | ICD-10-CM

## 2010-12-16 DIAGNOSIS — M791 Myalgia, unspecified site: Secondary | ICD-10-CM

## 2010-12-16 DIAGNOSIS — E781 Pure hyperglyceridemia: Secondary | ICD-10-CM

## 2010-12-16 DIAGNOSIS — M2550 Pain in unspecified joint: Secondary | ICD-10-CM

## 2010-12-16 LAB — LIPID PANEL
Cholesterol: 227 mg/dL — ABNORMAL HIGH (ref 0–200)
HDL: 30.4 mg/dL — ABNORMAL LOW (ref >39.00)
Triglycerides: 318 mg/dL — ABNORMAL HIGH (ref 0.0–149.0)
VLDL: 63.6 mg/dL — ABNORMAL HIGH (ref 0.0–40.0)

## 2010-12-16 LAB — BASIC METABOLIC PANEL
Calcium: 9.1 mg/dL (ref 8.4–10.5)
GFR: 72.82 mL/min (ref >60.00)
Glucose, Bld: 100 mg/dL — ABNORMAL HIGH (ref 70–99)
Potassium: 4.2 mEq/L (ref 3.5–5.1)
Sodium: 140 mEq/L (ref 135–145)

## 2010-12-16 LAB — CK: Total CK: 83 U/L (ref 7–232)

## 2010-12-16 LAB — SEDIMENTATION RATE: Sed Rate: 12 mm/hr (ref 0–22)

## 2010-12-16 LAB — LDL CHOLESTEROL, DIRECT: Direct LDL: 162.8 mg/dL

## 2010-12-16 LAB — HEPATIC FUNCTION PANEL
ALT: 39 U/L (ref 0–53)
Albumin: 4.4 g/dL (ref 3.5–5.2)
Total Protein: 7.1 g/dL (ref 6.0–8.3)

## 2010-12-16 NOTE — Patient Instructions (Signed)
Preventive Health Care: Exercise at least 30-45 minutes a day,  3-4 days a week.  Eat a low-fat diet with lots of fruits and vegetables, up to 7-9 servings per day. Avoid obesity; your goal is waist measurement < 40 inches.Consume less than 40 grams of sugar per day from foods & drinks with High Fructose Corn Sugar as #2,3 or # 4 on label.   

## 2010-12-16 NOTE — Progress Notes (Signed)
  Subjective:    Patient ID: [name] [name], male    DOB: 01-04-1966, 45 y.o.   MRN: [id number]  HPI Dyslipidemia assessment:  Family history of premature CAD/ MI: adopted .  Nutrition: no plan .  Exercise: minimal . Diabetes : no . HTN: no. Smoking history  : 1/2 ppd .   Weight :  variable. ROS: fatigue: not sustained ; chest pain : no ;claudication: no; palpitations: no; abd pain/bowel changes: intermittent watery or loose stool ; myalgias:in calves;  syncope : no ; memory loss: no;skin changes: no. Lab results reviewed :copy provided.     Review of Systems Seeing Dr Rudi Heap @ [facility] for Bipolar Disorder; LFTs requested. Cramps in hands with repetitive motion     Objective:   Physical Exam Gen.: Healthy and well-nourished in appearance. Alert, appropriate and cooperative throughout exam. Weight excess  centrally Eyes: No corneal or conjunctival inflammation noted.  Neck: No deformities, masses, or tenderness noted.  Thyroid normal. Lungs: Normal respiratory effort; chest expands symmetrically. Lungs are clear to auscultation without rales, wheezes, or increased work of breathing. Heart: Normal rate and rhythm. Normal S1 and S2. No gallop, click, or rub. S4 with slurring; no murmur. Abdomen: Bowel sounds normal; abdomen soft and nontender. No masses, organomegaly.Ventral  hernia noted.                                                                                 Musculoskeletal/extremities:  No clubbing, cyanosis, edema, or deformity noted. Range of motion  normal .Joints normal. Nail health  Good. 1st 2 nails L foot absent Vascular: Carotid, radial artery, dorsalis pedis and  posterior tibial pulses are full and equal. No bruits present. Neurologic: Alert and oriented x3. Deep tendon reflexes symmetrical and normal.          Skin: Intact without suspicious lesions or rashes. Lymph: No cervical, axillary  lymphadenopathy present. Psych:  Cognition and judgment appear intact. Alert,  communicative  and cooperative with normal attention span and concentration. No apparent delusions, illusions, hallucinations                                                                                 Assessment & Plan:  #1 dyslipidemia, mainly hypertriglyceridemia  #2 myalgias, gastrocnemius area  #3 arthralgias of the hands with repetitive motion  #4 bipolar disorder, as per Dr. Duayne Cal  Plan: See orders.

## 2010-12-25 ENCOUNTER — Ambulatory Visit (AMBULATORY_SURGERY_CENTER): Payer: Self-pay | Admitting: Gastroenterology

## 2010-12-25 ENCOUNTER — Encounter: Payer: Self-pay | Admitting: Gastroenterology

## 2010-12-25 VITALS — BP 110/76 | HR 86 | Temp 96.0°F | Resp 16 | Ht 69.0 in | Wt 225.0 lb

## 2010-12-25 DIAGNOSIS — K2940 Chronic atrophic gastritis without bleeding: Secondary | ICD-10-CM

## 2010-12-25 DIAGNOSIS — K2270 Barrett's esophagus without dysplasia: Secondary | ICD-10-CM

## 2010-12-25 MED ORDER — SODIUM CHLORIDE 0.9 % IV SOLN: 500.0000 mL | INTRAVENOUS | Status: DC

## 2010-12-25 NOTE — Progress Notes (Signed)
Pt continually taking o2 sat monitor off finger very hard to get accurate reading. o2 sats 90-92% when left on finger

## 2010-12-25 NOTE — Patient Instructions (Addendum)
Barrett's Esophagus The esophagus is the muscular tube that carries food and saliva from the mouth to the stomach. Barrett's esophagus involves changes in the esophagus. Some of its lining is replaced by a type of tissue similar to that found in the intestine. This process is called intestinal metaplasia. While Barrett's esophagus may cause no symptoms itself, a small number of people with this condition develop a relatively rare but often deadly type of cancer of the esophagus. It is called esophageal adenocarcinoma. Barrett's esophagus is associated with the common condition called GERD (gastroesophageal reflux disease). HOW THE ESOPHAGUS WORKS The esophagus carries food, liquids, and saliva from the mouth to the stomach. The stomach acts as a container to start digestion and pump food and liquids into the intestines in a controlled process. Food can then be properly digested over time. Nutrients can be taken in (absorbed) by the intestines. The esophagus moves food to the stomach by coordinated contractions of its muscular lining. This process is automatic. People are usually not aware of it. Many people have felt their esophagus when they:  Swallow something too large.   Try to eat too quickly.   Drink very hot or cold liquids.  They then feel the movement of the food or drink down the esophagus into the stomach. This may be an uncomfortable feeling. DIGESTIVE TRACT The muscular layers of the esophagus are normally pinched together at both the upper and lower ends by muscles. These muscles are called sphincters. When a person swallows, the sphincters relax automatically. This allows food or drink to pass from the mouth, into the stomach. The muscles then close rapidly. This prevents the swallowed food or drink from leaking out of the stomach, back into the esophagus or into the mouth. These muscles make it possible to swallow while lying down or even upside-down. When people belch to release  swallowed air or gas from carbonated beverages, the sphincters relax. Then small amounts of food or drink may come back up, briefly. This condition is called reflux. The esophagus quickly squeezes the material back into the stomach. This is considered normal. These functions of the esophagus are an important part of everyday life. However, people who must have their esophagus removed, for example because of cancer, can live a relatively healthy life without it. GERD (GASTROESOPHAGEAL REFLUX DISEASE) Having some stomach contents (liquids or gas) sometimes reflux (come back up from the stomach into the esophagus) is considered normal. When it happens often, and causes other symptoms, it is considered a medical problem or disease. However, it is not necessarily a serious one or one that requires seeing a caregiver. The stomach produces acid and enzymes to digest food. When this mixture refluxes into the esophagus more often than normal or for a longer period of time than normal, it may produce symptoms. These symptoms are often called acid reflux. They are often described by people as heartburn, indigestion, or "gas". The symptoms often consist of a burning sensation below and behind the lower part of the breastbone or sternum. Almost everyone has experienced these symptoms at least once. This is typically a result of overeating. Other things that provoke GERD symptoms include:  Being overweight.   Eating certain types of foods.   Being pregnant.  In most people, GERD symptoms may last only a short time and require no treatment at all.  More continual symptoms are often quickly relieved by over-the-counter acid-reducing agents, such as antacids. Other drugs used to relieve GERD symptoms are antisecretory drugs,  such as histamine2 (H2) blockers or proton pump inhibitors. People who have symptoms often should talk with their caregiver. Other diseases can have similar symptoms. Prescription medicines,  together with other actions, might be needed to reduce reflux. GERD that is untreated over a long period can lead to problems. An example is an ulcer in the esophagus, that could cause bleeding. Another common problem is scar tissue that blocks the movement of swallowed food and drink through the esophagus. This condition is called stricture. Esophageal reflux may also cause certain less common symptoms. These include hoarseness or chronic cough. It sometimes provokes conditions such as asthma. While most patients find that lifestyle changes and acid-blocking drugs relieve their symptoms, caregivers sometimes advise surgery. Overall, GERD is one of the most common medical conditions. About 20 percent of the population can be affected over a lifetime. GERD AND BARRETT'S ESOPHAGUS The exact causes of Barrett's esophagus are not known. It is thought to be caused in part by the same factors that cause GERD. People who do not have heartburn can have Barrett's esophagus. However, it is found about 3 to 5 times more often in people with this condition. Barrett's esophagus is uncommon in children. The average age at diagnosis is 94. But it is usually difficult to know when the problem started. It is about twice as common in men as in women. It is much more common in white men than in men of other races. BARRETT'S ESOPHAGUS AND CANCER OF THE ESOPHAGUS Barrett's esophagus does not cause symptoms itself. However, it seems to precede the development of a particular kind of cancer. This cancer is esophageal adenocarcinoma. The risk of developing this cancer is 30 to 125 times higher in people who have Barrett's esophagus than in people who do not. This type of cancer is increasing quickly in white men. The increase is possibly related to the rise in obesity and GERD. For people who have Barrett's esophagus, the risk of getting cancer of the esophagus is small. It is less than 1 percent (0.4 percent to 0.5 percent) per  year. Esophageal adenocarcinoma is often not curable. This is partly because the disease is often discovered at a late stage and treatments are not effective. DIAGNOSIS (HOW TO TELL WHAT IS WRONG) AND SCREENING Diagnosing Barrett's esophagus is not easy. At the present time it cannot be diagnosed based on symptoms, physical exam, or blood tests. The only useful test is upper gastrointestinal endoscopy and biopsy. In this procedure, a flexible tube called an endoscope is used. This tool is like a pencil sized flexible telescope. It has a light and tiny camera. It is passed into the esophagus. If the tissue appears suspicious to your caregiver, biopsies must be done. A biopsy is the removal of a small piece of tissue. This is done using a pincher-like device passed through the endoscope. A pathologist is a specialist who examines body tissue samples. He or she examines the tissue under a microscope to confirm the diagnosis. Looking for a medical problem in people who do not know whether they have one is called screening. Currently, there are no commonly accepted guidelines on who should have endoscopy, to check for Barrett's esophagus. There are many reasons for the lack of firm recommendations about screening. Among them are the great cost and occasional risk of side effects of the test. Also, the rate of finding Barrett's esophagus is low. Finding the problem early has not been proven to prevent deaths from cancer. Many caregivers advise that  adult patients who are over the age of 12 and have had GERD symptoms for a number of years have endoscopy, to see whether they have Barrett's esophagus. Screening for this condition in people who have no symptoms is not advised. TREATMENT Barrett's esophagus has no cure, other than surgical removal of the esophagus. This is a serious operation. Surgery is advised only for people who have a high risk of developing cancer or who already have it. Most caregivers recommend  treating GERD with acid-blocking drugs. This is sometimes linked to improvement in the extent of the Barrett's tissue. But this approach has not been proven to reduce the risk of cancer. Treating reflux with surgery for GERD also does not seem to cure Barrett's esophagus. Several experimental approaches are under study. One attempts to see whether destroying the Barrett's tissue by heat or other means, through an endoscope, can get rid of the condition. But this approach has potential risks and unknown effectiveness. LOOKING FOR DYSPLASIA AND CANCER Occasional endoscopic examinations to look for early warning signs of cancer are generally advised for people who have Barrett's esophagus. This approach is called surveillance. When people who have Barrett's esophagus develop cancer, the process seems to go through an intermediate stage. In this stage cancer cells appear in the Barrett's tissue. This condition is called dysplasia. It can be seen only in biopsies with a microscope. The process is patchy and cannot be seen directly through the endoscope. So, multiple biopsies must be taken. Even then, it can be missed. The process of change from Barrett's to cancer seems to happen only in a few patients. It is less than 1 percent per year. And it happens over a relatively long period of time. Most caregivers advise that patients with Barrett's esophagus undergo occasional endoscopy to have biopsies. The recommended time between endoscopies varies depending on circumstances. The best time interval has not been decided. TREATMENT FOR DYSPLASIA OR ESOPHAGEAL ADENOCARCINOMA If a person with Barrett's esophagus is found to have dysplasia or cancer, the caregiver will usually recommend surgery. This is if the person is strong enough and has a good chance of being cured. The type of surgery may vary. But it usually involves removing most of the esophagus and pulling the stomach up into the chest to attach it to what  remains of the esophagus. Many patients with Barrett's esophagus are elderly. They may have many other medical problems that make surgery unwise. In these patients, other methods to treat dysplasia are being studied. HOPE THROUGH RESEARCH Many important questions about Barrett's esophagus need further research to:  Find better ways to identify people who have the problem.   Find out what causes it.   Test treatments that may prevent or get rid of it.   Find better treatments for people who have Barrett's esophagus with cancer.  The [employer] and Digestive and Kidney Diseases, and the [employer], sponsor research programs to study Barrett's esophagus. IMPORTANT POINTS TO REMEMBER  In Barrett's esophagus, the cells lining the esophagus change. They become similar to the cells lining the intestine.   Barrett's esophagus is connected with gastroesophageal reflux disease or GERD.   A small number of people with Barrett's esophagus may develop esophageal cancer.   Barrett's esophagus is diagnosed by upper gastrointestinal endoscopy and biopsy.   People who have Barrett's esophagus should have periodic esophageal exams.   Taking acid-blocking drugs for GERD may help improve Barrett's esophagus.   Removal of the esophagus is  recommended only for people who have a high risk of developing cancer or who already have it.  ADDITIONAL RESOURCES For more information about GERD or Barrett's esophagus, contact: [employer] ([employer] [address] [place], [place] [address] Phone: [phone] or [phone] Fax: [phone] Email: iffgd@iffgd .org Internet: www.iffgd.[facility] [employer] [facility] Information Clearinghouse [address] [place], [place] [address] Email: nddic@info .[email or web address].si Document Released: 08/16/2003 Document Re-Released: 11/13/2009 [facility] Patient Information 2011  [place], [place].  Please review all discharge papers given to you by your recovery room nurse.  Biopsies were taken today by Dr Arlyce Dice. You will receive a letter in 1-2 weeks explaining the results of the biopsies and Dr. Arlyce Dice recommendations.  If you experience any problems after discharge please call [phone].  One of our nurses will call in the am to see how you are doing and answer any questions you may have.  Thank you

## 2010-12-26 ENCOUNTER — Telehealth: Payer: Self-pay | Admitting: *Deleted

## 2010-12-26 NOTE — Telephone Encounter (Signed)
No answer at that number

## 2011-01-31 ENCOUNTER — Ambulatory Visit (INDEPENDENT_AMBULATORY_CARE_PROVIDER_SITE_OTHER): Payer: Self-pay | Admitting: Internal Medicine

## 2011-01-31 ENCOUNTER — Encounter: Payer: Self-pay | Admitting: Internal Medicine

## 2011-01-31 VITALS — BP 112/64 | HR 119 | Temp 98.5°F | Wt 217.0 lb

## 2011-01-31 DIAGNOSIS — S8992XA Unspecified injury of left lower leg, initial encounter: Secondary | ICD-10-CM

## 2011-01-31 DIAGNOSIS — M25469 Effusion, unspecified knee: Secondary | ICD-10-CM

## 2011-01-31 DIAGNOSIS — S99919A Unspecified injury of unspecified ankle, initial encounter: Secondary | ICD-10-CM

## 2011-01-31 DIAGNOSIS — M25462 Effusion, left knee: Secondary | ICD-10-CM

## 2011-01-31 NOTE — Patient Instructions (Signed)
Referral has been made to the orthopedist. The fluid on the knee may need to be drained to facilitate healing. Use the Celebrex  samples twice a day until seen by orthopedist. Keep leg elevated and apply warm moist compresses 3-4 times a day.
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
## 2011-02-06 ENCOUNTER — Other Ambulatory Visit: Payer: Self-pay | Admitting: *Deleted

## 2011-02-20 ENCOUNTER — Other Ambulatory Visit (HOSPITAL_COMMUNITY): Payer: Self-pay | Admitting: Orthopaedic Surgery

## 2011-02-20 DIAGNOSIS — S83519A Sprain of anterior cruciate ligament of unspecified knee, initial encounter: Secondary | ICD-10-CM

## 2011-02-20 DIAGNOSIS — M25572 Pain in left ankle and joints of left foot: Secondary | ICD-10-CM

## 2011-02-21 ENCOUNTER — Telehealth: Payer: Self-pay

## 2011-02-21 NOTE — Telephone Encounter (Signed)
Patient's mother states she has called several times about [name] [name]'s medication. I reveiwed chart and did not see where med was ordered. I informed [name] [name] that I will call and place order for Tricor and offered a 10 day supply to be sent to local pharmacy. She said if we could just place order thorugh patient assistance for she does not know how much it will cost at a local pharmacy.  Called [phone] and placed order

## 2011-02-26 ENCOUNTER — Other Ambulatory Visit (HOSPITAL_COMMUNITY): Payer: Self-pay | Admitting: Orthopaedic Surgery

## 2011-02-26 ENCOUNTER — Ambulatory Visit (HOSPITAL_COMMUNITY)
Admission: RE | Admit: 2011-02-26 | Discharge: 2011-02-26 | Disposition: A | Discharge status: Home / Self Care | Payer: Self-pay | Source: Ambulatory Visit | Attending: Orthopaedic Surgery | Admitting: Orthopaedic Surgery

## 2011-02-26 DIAGNOSIS — W19XXXA Unspecified fall, initial encounter: Secondary | ICD-10-CM | POA: Insufficient documentation

## 2011-02-26 DIAGNOSIS — S83519A Sprain of anterior cruciate ligament of unspecified knee, initial encounter: Secondary | ICD-10-CM

## 2011-02-26 DIAGNOSIS — S83419A Sprain of medial collateral ligament of unspecified knee, initial encounter: Secondary | ICD-10-CM | POA: Insufficient documentation

## 2011-02-26 DIAGNOSIS — IMO0002 Reserved for concepts with insufficient information to code with codable children: Secondary | ICD-10-CM | POA: Insufficient documentation

## 2011-02-26 DIAGNOSIS — M25572 Pain in left ankle and joints of left foot: Secondary | ICD-10-CM

## 2011-02-26 DIAGNOSIS — S83509A Sprain of unspecified cruciate ligament of unspecified knee, initial encounter: Secondary | ICD-10-CM | POA: Insufficient documentation

## 2011-02-26 DIAGNOSIS — M25469 Effusion, unspecified knee: Secondary | ICD-10-CM | POA: Insufficient documentation

## 2011-02-26 DIAGNOSIS — Z1389 Encounter for screening for other disorder: Secondary | ICD-10-CM | POA: Insufficient documentation

## 2011-03-11 ENCOUNTER — Ambulatory Visit (HOSPITAL_BASED_OUTPATIENT_CLINIC_OR_DEPARTMENT_OTHER)
Admission: RE | Admit: 2011-03-11 | Discharge: 2011-03-11 | Disposition: A | Discharge status: Home / Self Care | Payer: Self-pay | Source: Ambulatory Visit | Attending: Orthopaedic Surgery | Admitting: Orthopaedic Surgery

## 2011-03-11 DIAGNOSIS — IMO0002 Reserved for concepts with insufficient information to code with codable children: Secondary | ICD-10-CM | POA: Insufficient documentation

## 2011-03-11 DIAGNOSIS — S83509A Sprain of unspecified cruciate ligament of unspecified knee, initial encounter: Secondary | ICD-10-CM | POA: Insufficient documentation

## 2011-03-11 DIAGNOSIS — Y929 Unspecified place or not applicable: Secondary | ICD-10-CM | POA: Insufficient documentation

## 2011-03-11 DIAGNOSIS — X58XXXA Exposure to other specified factors, initial encounter: Secondary | ICD-10-CM | POA: Insufficient documentation

## 2011-03-19 ENCOUNTER — Other Ambulatory Visit: Payer: Self-pay | Admitting: *Deleted

## 2011-03-19 ENCOUNTER — Other Ambulatory Visit: Payer: Self-pay | Admitting: Gastroenterology

## 2011-03-19 MED ORDER — DEXLANSOPRAZOLE 60 MG PO CPDR: 60.0000 mg | DELAYED_RELEASE_CAPSULE | Freq: Every day | ORAL | Status: DC

## 2011-03-19 NOTE — Telephone Encounter (Signed)
Refilled Dexilant for pt..  [phone] PAP

## 2011-03-19 NOTE — Telephone Encounter (Signed)
Pt needs new form for Patient Assistant Program filled out along with new prescription and income statement Pts wife  will come to get samples of Dexilant today and has made an appointment for 11/12 for a follow up with Dr Arlyce Dice

## 2011-03-19 NOTE — Telephone Encounter (Signed)
Called pharmacy to cancel script sent in today for dexilant Pt wanted script for PAP

## 2011-03-20 ENCOUNTER — Other Ambulatory Visit: Payer: Self-pay | Admitting: *Deleted

## 2011-03-20 MED ORDER — DEXLANSOPRAZOLE 60 MG PO CPDR: 60.0000 mg | DELAYED_RELEASE_CAPSULE | Freq: Every day | ORAL | Status: DC

## 2011-03-20 NOTE — Telephone Encounter (Signed)
Mother picked up samples of Dexilant for pt. Also dropped off pts PAP forms to send to [facility] FORM FILLED OUT AND MAILED TODAY

## 2011-03-24 ENCOUNTER — Ambulatory Visit: Payer: Self-pay | Attending: Orthopaedic Surgery | Admitting: Physical Therapy

## 2011-03-24 DIAGNOSIS — M25669 Stiffness of unspecified knee, not elsewhere classified: Secondary | ICD-10-CM | POA: Insufficient documentation

## 2011-03-24 DIAGNOSIS — R262 Difficulty in walking, not elsewhere classified: Secondary | ICD-10-CM | POA: Insufficient documentation

## 2011-03-24 DIAGNOSIS — IMO0001 Reserved for inherently not codable concepts without codable children: Secondary | ICD-10-CM | POA: Insufficient documentation

## 2011-03-24 DIAGNOSIS — M25569 Pain in unspecified knee: Secondary | ICD-10-CM | POA: Insufficient documentation

## 2011-03-24 DIAGNOSIS — R5381 Other malaise: Secondary | ICD-10-CM | POA: Insufficient documentation

## 2011-03-26 ENCOUNTER — Ambulatory Visit: Payer: Self-pay | Admitting: Physical Therapy

## 2011-03-28 ENCOUNTER — Ambulatory Visit: Payer: Self-pay | Admitting: *Deleted

## 2011-03-31 ENCOUNTER — Ambulatory Visit: Payer: Self-pay | Admitting: Physical Therapy

## 2011-04-02 ENCOUNTER — Ambulatory Visit: Payer: Self-pay | Admitting: Physical Therapy

## 2011-04-03 NOTE — Op Note (Signed)
[name] [name], [name] [name] NO.:  [id number]  MEDICAL RECORD NO.:  [id number]  LOCATION:                                 FACILITY:  PHYSICIAN:  Lubertha Basque. Daivik Overley, M.D.DATE OF BIRTH:  [birth year]  DATE OF PROCEDURE:  03/11/2011 DATE OF DISCHARGE:                              OPERATIVE REPORT   PREOPERATIVE DIAGNOSES: 1. Left knee anterior cruciate ligament tear. 2. Left knee torn medial meniscus. 3. Left knee arthrofibrosis.  POSTOPERATIVE DIAGNOSES: 1. Left knee anterior cruciate ligament tear. 2. Left knee torn medial meniscus. 3. Left knee arthrofibrosis.  PROCEDURE: 1. Left knee chondroplasty and debridement. 2. Left knee manipulation.  ANESTHESIA:  General.  ATTENDING SURGEON:  Lubertha Basque. Jerl Santos, MD  ASSISTANT:  Lindwood Qua, PA   INDICATIONS FOR PROCEDURE:  The patient is a 45 year old man who unfortunately injured his knee significantly and suffered an ACL tear and meniscal injury by MRI.  He presented to the office with significant stiffness despite trying to work on his motion.  He had a range from about 20-80 degrees.  At this point, offered manipulation along with arthroscopy to address the articular and meniscal cartilage issues in the knee.  We want to delay ACL reconstruction until he regains his motion and potentially forever depending on the level of stability he achieves with therapy.  Informed operative consent was obtained after discussion of possible complications including reaction to anesthesia and infection.  SUMMARY/FINDINGS/PROCEDURE:  Under general anesthesia, an arthroscopy of the left knee was performed.  He had some significant stiffness, asleep and we performed a manipulation to regain most of his motion. At arthroscopy the medial meniscus injury appeared to be healed with no displaced full fragment.  The ACL stump was stuck in anterior position and this was removed.  I performed a chondroplasty patellofemoral but  no bare bone was exposed and the lateral compartment was completely benign.  DESCRIPTION OF PROCEDURE:  The patient was brought to the operating suite where general anesthetic was applied without difficulty.  He was positioned in supine, prepped and draped in normal sterile fashion. After administration of IV Kefzol, an arthroscopy of the left knee was formed through total of two portals.  Findings were as noted above, procedure consisted predominantly of the removal of the ACL stump in an anterior position which seemed to be impinging on the lateral aspect of the joint towards extension.  No meniscal injuries were seen.  Some mild chondromalacia patellofemoral and chondroplasty was done here.  At manipulation, we regained all this motion.  The knee was thoroughly irrigated followed by placement of Marcaine with epinephrine and morphine in the joint.  Adaptic was placed over the two portals followed by dry gauze and loose Ace wrap.  Estimated blood loss and fluids can obtained from anesthesia records.  DISPOSITION:  The patient was extubated in operating room and taken to recovery room in stable condition.  He wish to go home same day and follow up in the office closely.  I will contact him by phone tonight.     Lubertha Basque Jerl Santos, M.D.     PGD/MEDQ  D:  03/11/2011  T:  03/12/2011  Job:  [id number]  Electronically Signed by Marcene Corning M.D. on 04/03/2011 01:42:39 PM

## 2011-04-04 ENCOUNTER — Encounter: Payer: Self-pay | Admitting: *Deleted

## 2011-04-07 ENCOUNTER — Ambulatory Visit: Payer: Self-pay | Admitting: Physical Therapy

## 2011-04-09 ENCOUNTER — Ambulatory Visit: Payer: Self-pay | Attending: Orthopaedic Surgery | Admitting: Physical Therapy

## 2011-04-09 DIAGNOSIS — IMO0001 Reserved for inherently not codable concepts without codable children: Secondary | ICD-10-CM | POA: Insufficient documentation

## 2011-04-09 DIAGNOSIS — M25569 Pain in unspecified knee: Secondary | ICD-10-CM | POA: Insufficient documentation

## 2011-04-09 DIAGNOSIS — M25669 Stiffness of unspecified knee, not elsewhere classified: Secondary | ICD-10-CM | POA: Insufficient documentation

## 2011-04-09 DIAGNOSIS — R5381 Other malaise: Secondary | ICD-10-CM | POA: Insufficient documentation

## 2011-04-09 DIAGNOSIS — R262 Difficulty in walking, not elsewhere classified: Secondary | ICD-10-CM | POA: Insufficient documentation

## 2011-04-11 ENCOUNTER — Ambulatory Visit: Payer: Self-pay | Attending: Orthopaedic Surgery | Admitting: Physical Therapy

## 2011-04-11 DIAGNOSIS — R262 Difficulty in walking, not elsewhere classified: Secondary | ICD-10-CM | POA: Insufficient documentation

## 2011-04-11 DIAGNOSIS — R5381 Other malaise: Secondary | ICD-10-CM | POA: Insufficient documentation

## 2011-04-11 DIAGNOSIS — M25569 Pain in unspecified knee: Secondary | ICD-10-CM | POA: Insufficient documentation

## 2011-04-11 DIAGNOSIS — M25669 Stiffness of unspecified knee, not elsewhere classified: Secondary | ICD-10-CM | POA: Insufficient documentation

## 2011-04-11 DIAGNOSIS — IMO0001 Reserved for inherently not codable concepts without codable children: Secondary | ICD-10-CM | POA: Insufficient documentation

## 2011-04-14 ENCOUNTER — Ambulatory Visit: Payer: Self-pay | Admitting: Physical Therapy

## 2011-04-15 ENCOUNTER — Other Ambulatory Visit (INDEPENDENT_AMBULATORY_CARE_PROVIDER_SITE_OTHER): Payer: Self-pay

## 2011-04-15 DIAGNOSIS — E785 Hyperlipidemia, unspecified: Secondary | ICD-10-CM

## 2011-04-15 DIAGNOSIS — T887XXA Unspecified adverse effect of drug or medicament, initial encounter: Secondary | ICD-10-CM

## 2011-04-15 NOTE — Progress Notes (Signed)
Labs only

## 2011-04-16 ENCOUNTER — Ambulatory Visit: Payer: Self-pay | Admitting: Physical Therapy

## 2011-04-17 LAB — NMR LIPOPROFILE WITH LIPIDS
Cholesterol, Total: 215 mg/dL — ABNORMAL HIGH (ref <200)
HDL Particle Number: 29.1 umol/L — ABNORMAL LOW (ref >=30.5)
LDL Size: 20.6 nm (ref >20.5)
LP-IR Score: 77 — ABNORMAL HIGH (ref <=45)
Triglycerides: 307 mg/dL — ABNORMAL HIGH (ref <150)

## 2011-04-18 ENCOUNTER — Ambulatory Visit: Payer: Self-pay | Admitting: *Deleted

## 2011-04-21 ENCOUNTER — Encounter: Payer: Self-pay | Admitting: Gastroenterology

## 2011-04-21 ENCOUNTER — Encounter: Payer: Self-pay | Admitting: Physical Therapy

## 2011-04-21 ENCOUNTER — Ambulatory Visit (INDEPENDENT_AMBULATORY_CARE_PROVIDER_SITE_OTHER): Payer: Self-pay | Admitting: Gastroenterology

## 2011-04-21 VITALS — BP 104/62 | HR 96 | Ht 69.0 in | Wt 222.0 lb

## 2011-04-21 DIAGNOSIS — K2270 Barrett's esophagus without dysplasia: Secondary | ICD-10-CM

## 2011-04-21 NOTE — Assessment & Plan Note (Signed)
Plan to continue dexilant and repeat endoscopy in 3 years

## 2011-04-21 NOTE — Progress Notes (Signed)
Mr. Spadafore has returned for followup of Barrett's esophagus. Biopsies in July did not demonstrate any changes of Barrett's although prior biopsies did show this. He has no GI complaints except for rare pyrosis. He is on dexilant.

## 2011-04-21 NOTE — Patient Instructions (Addendum)
You have been given a separate informational sheet regarding your tobacco use, the importance of quitting and local resources to help you quit.  Follow up as needed 

## 2011-04-23 ENCOUNTER — Ambulatory Visit: Payer: Self-pay | Admitting: Physical Therapy

## 2011-04-25 ENCOUNTER — Telehealth: Payer: Self-pay | Admitting: *Deleted

## 2011-04-25 ENCOUNTER — Ambulatory Visit: Payer: Self-pay | Admitting: Physical Therapy

## 2011-04-25 NOTE — Telephone Encounter (Signed)
Called and left message for pt that we have him samples in the office until we can get his Dexilant coming again from PAP.  New paperwork has to be filled out because [employer] has taken over [employer].

## 2011-04-28 ENCOUNTER — Ambulatory Visit: Payer: Self-pay | Admitting: Internal Medicine

## 2011-04-28 ENCOUNTER — Other Ambulatory Visit: Payer: Self-pay | Admitting: *Deleted

## 2011-04-28 ENCOUNTER — Ambulatory Visit: Payer: Self-pay | Admitting: Physical Therapy

## 2011-04-28 MED ORDER — DEXLANSOPRAZOLE 60 MG PO CPDR: 60.0000 mg | DELAYED_RELEASE_CAPSULE | Freq: Every day | ORAL | Status: DC

## 2011-04-28 NOTE — Telephone Encounter (Signed)
Mailed out new forms today for patients Dexilant patient assistance program, mailed patient a copy also  Patient came in on Friday and picked up samples of Dexilant to help him through until is assistance is approved

## 2011-04-29 ENCOUNTER — Encounter: Payer: Self-pay | Admitting: Internal Medicine

## 2011-04-29 ENCOUNTER — Ambulatory Visit (INDEPENDENT_AMBULATORY_CARE_PROVIDER_SITE_OTHER): Payer: Self-pay | Admitting: Internal Medicine

## 2011-04-29 DIAGNOSIS — E8881 Metabolic syndrome: Secondary | ICD-10-CM

## 2011-04-29 DIAGNOSIS — R7309 Other abnormal glucose: Secondary | ICD-10-CM

## 2011-04-29 DIAGNOSIS — E781 Pure hyperglyceridemia: Secondary | ICD-10-CM

## 2011-04-29 DIAGNOSIS — J029 Acute pharyngitis, unspecified: Secondary | ICD-10-CM

## 2011-04-29 LAB — POCT RAPID STREP A (OFFICE): Rapid Strep A Screen: NEGATIVE

## 2011-04-29 MED ORDER — METFORMIN HCL 500 MG PO TABS: 500.0000 mg | ORAL_TABLET | Freq: Every day | ORAL | Status: DC

## 2011-04-29 MED ORDER — FENOFIBRATE 145 MG PO TABS: 145.0000 mg | ORAL_TABLET | Freq: Every day | ORAL | Status: DC

## 2011-04-29 NOTE — Progress Notes (Signed)
  Subjective:    Patient ID: [name] [name], male    DOB: January 18, 1966, 45 y.o.   MRN: [id number]  HPI Respiratory tract infection Onset/symptoms:11/9 as ST, chills , sweats Exposures (illness/environmental/extrinsic):no Progression of symptoms:improved Treatments/response:no Present symptoms: Fever/chills/sweats:sweating Frontal headache:no Facial pain:no Nasal purulence:no Sore throat:yes Dental pain:no Lymphadenopathy:no Wheezing/shortness of breath:no Cough/sputum/hemoptysis:chronic cough; sputum clear Associated extrinsic/allergic symptoms:itchy eyes/ sneezing:no Smoking history:1/2 ppd    Review of Systems labs were reviewed; history post right have improved but remain over 300. He is now at the upper limits of normal the A1c at a value of 6.1%. Dietary sources of triglycerides were discussed; he has some understanding of this. He blames his noncompliance on "not wearing  his glasses to store when shopping". Advanced cholesterol testing revealed an LDL of 122 with 2320 total particles and 1591 small dense particles. His insulin resistance score is 77 with the normal less than 45. The risk of developing diabetes was discussed. His LDL goal should be less 100, ideally less than 70     Objective:   Physical Exam General appearance : increased weight , especially centrally. In no acute distress or increased work of breathing is present.  No  lymphadenopathy about the head, neck, or axilla noted.   Eyes: No conjunctival inflammation or lid edema is present. There is no scleral icterus. Ptosis OS  Ears:  External ear exam shows no significant lesions or deformities.  Otoscopic examination reveals clear canals, tympanic membranes are intact bilaterally without bulging, retraction, inflammation or discharge.  Nose:  External nasal examination shows no deformity or inflammation. Nasal mucosa are pink and moist without lesions or exudates. No septal dislocation .No obstruction to airflow.    Oral exam: Dental hygiene is good; lips and gums are healthy appearing.There is no oropharyngeal erythema or exudate noted.    Heart:  Normal rate and regular rhythm. S1 and S2 normal without gallop, murmur, click, rub or other extra sounds.   Lungs:Chest clear to auscultation; no wheezes, rhonchi,rales ,or rubs present.No increased work of breathing.    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

## 2011-04-29 NOTE — Patient Instructions (Addendum)
Eat a low-fat diet with lots of fruits and vegetables, up to 7-9 servings per day. Avoid obesity; your goal is waist measurement < 40 inches.Consume less than 40 grams of sugar per day from foods & drinks with High Fructose Corn Sugar as #1,2,3 or # 4 on label. Follow the low carb nutrition program in [employer] [place] [employer] as closely as possible to prevent Diabetes progression & complications. White carbohydrates (potatoes, rice, bread, and pasta) have a high spike of sugar and a high load of sugar. For example a  baked potato has a cup of sugar and a  french fry  2 teaspoons of sugar. Yams, wild  rice, whole grained bread &  wheat pasta have been much lower spike and load of  sugar. Portions should be the size of a deck of cards or your palm.  Please  schedule fasting Labs : A1c,Lipids in 4 months.  Please bring these instructions to that Lab appt.  Zicam Melts or Zinc lozenges ; vitamin C 2000 mg daily; & Echinacea for 4-7 days. Report fever, exudate("pus") or progressive pain.

## 2011-05-05 ENCOUNTER — Ambulatory Visit: Payer: Self-pay | Admitting: Physical Therapy

## 2011-05-07 ENCOUNTER — Encounter: Payer: Self-pay | Admitting: Physical Therapy

## 2011-05-08 ENCOUNTER — Other Ambulatory Visit: Payer: Self-pay

## 2011-05-08 NOTE — Telephone Encounter (Signed)
Called [phone], application expired. Requested new application to be sent

## 2011-05-09 ENCOUNTER — Encounter: Payer: Self-pay | Admitting: Physical Therapy

## 2011-05-13 NOTE — Telephone Encounter (Signed)
Application never received, requested again. Recording stated application to be sent in 1 hour

## 2011-05-15 NOTE — Telephone Encounter (Signed)
Form received, completed on our end. Left message on voicemail for patient's mother to stop by to complete her end or I can mail.

## 2011-05-16 ENCOUNTER — Telehealth: Payer: Self-pay | Admitting: Internal Medicine

## 2011-05-19 NOTE — Telephone Encounter (Signed)
Patient's mother came by and signed paperwork. Information forwarded to patient assistant program

## 2011-05-20 NOTE — Telephone Encounter (Signed)
This was already handled.

## 2011-06-12 ENCOUNTER — Telehealth: Payer: Self-pay | Admitting: Gastroenterology

## 2011-06-12 NOTE — Telephone Encounter (Signed)
[facility] no longer carries Dexilant on PAP    Will have to supply pt with samples He has no insurance and does not work

## 2011-06-13 ENCOUNTER — Other Ambulatory Visit: Payer: Self-pay

## 2011-06-13 DIAGNOSIS — E781 Pure hyperglyceridemia: Secondary | ICD-10-CM

## 2011-06-13 NOTE — Telephone Encounter (Signed)
Called [phone] to place order for med, I was told it is too early to fill med, med can be filled on 07/06/2011.  I called paitent's mother to inform her and she indicated that is inaccurate info and patient is due for refill. I will call back to speak with a rep to further discuss

## 2011-06-17 NOTE — Telephone Encounter (Signed)
Called 1 [phone] number and call would not go through, will try again later.

## 2011-06-18 NOTE — Telephone Encounter (Signed)
Left message on voicemail for patient assistance company to call me back to discuss

## 2011-06-25 MED ORDER — FENOFIBRATE 145 MG PO TABS: 145.0000 mg | ORAL_TABLET | Freq: Every day | ORAL | Status: DC

## 2011-06-25 NOTE — Telephone Encounter (Signed)
Called again to get med filled, call was never returned by assistance program. Still indicates it is too soon to fill med, med will have to be filled on or after 07/06/2011. I spoke with someone and they stated med was sent out on 05/22/2011, they will track product and we will have to call back in 3 days to get a new status update

## 2011-06-25 NOTE — Telephone Encounter (Signed)
Spoke with patient's mother and gave her a status update

## 2011-07-01 NOTE — Telephone Encounter (Signed)
Medications received today, placed at the front for pick-up. Called patient's mother on home and work number (no answer) will try again later

## 2011-07-01 NOTE — Telephone Encounter (Signed)
Called both number's again (no answer) will try again later

## 2011-07-01 NOTE — Telephone Encounter (Signed)
Patient's mother aware medication is available for pick-up

## 2011-07-07 ENCOUNTER — Telehealth: Payer: Self-pay | Admitting: Gastroenterology

## 2011-07-07 NOTE — Telephone Encounter (Signed)
Left samples for pt. New PAP forms to fill out for Dexilant

## 2011-07-28 ENCOUNTER — Telehealth: Payer: Self-pay | Admitting: Gastroenterology

## 2011-07-28 NOTE — Telephone Encounter (Signed)
Spoke with pt. His mom will come pick up samples...forms faxed on the 13th and today for PAP

## 2011-08-05 ENCOUNTER — Telehealth: Payer: Self-pay | Admitting: Gastroenterology

## 2011-08-05 NOTE — Telephone Encounter (Signed)
Pts mother states that the pt assistance company needs to have a letter faxed to [phone] stating that the pt has no income. His case number is [id number]. Letter faxed to the above number.

## 2011-08-14 ENCOUNTER — Telehealth: Payer: Self-pay | Admitting: *Deleted

## 2011-08-14 NOTE — Telephone Encounter (Signed)
Received Approval Letter with Patient Assistance ProgramR[employer]ene [employer])patient was approved to receive free/help with their medication until 08-06-2012.[name] [name] Patient was notified.. Letter was sent downstairs to be scanned in.[name] [name]

## 2011-08-25 ENCOUNTER — Other Ambulatory Visit: Payer: Self-pay

## 2011-08-27 ENCOUNTER — Other Ambulatory Visit (INDEPENDENT_AMBULATORY_CARE_PROVIDER_SITE_OTHER): Payer: Medicare Other

## 2011-08-27 DIAGNOSIS — R7309 Other abnormal glucose: Secondary | ICD-10-CM

## 2011-08-27 DIAGNOSIS — T887XXA Unspecified adverse effect of drug or medicament, initial encounter: Secondary | ICD-10-CM

## 2011-09-04 ENCOUNTER — Ambulatory Visit (HOSPITAL_BASED_OUTPATIENT_CLINIC_OR_DEPARTMENT_OTHER)
Admission: RE | Admit: 2011-09-04 | Discharge: 2011-09-04 | Disposition: A | Discharge status: Home / Self Care | Payer: Medicare Other | Source: Ambulatory Visit | Attending: Internal Medicine | Admitting: Internal Medicine

## 2011-09-04 ENCOUNTER — Encounter: Payer: Self-pay | Admitting: Internal Medicine

## 2011-09-04 ENCOUNTER — Ambulatory Visit (INDEPENDENT_AMBULATORY_CARE_PROVIDER_SITE_OTHER): Payer: Medicare Other | Admitting: Internal Medicine

## 2011-09-04 VITALS — BP 116/80 | HR 80 | Wt 233.8 lb

## 2011-09-04 DIAGNOSIS — F17200 Nicotine dependence, unspecified, uncomplicated: Secondary | ICD-10-CM

## 2011-09-04 DIAGNOSIS — F319 Bipolar disorder, unspecified: Secondary | ICD-10-CM

## 2011-09-04 DIAGNOSIS — R0789 Other chest pain: Secondary | ICD-10-CM | POA: Insufficient documentation

## 2011-09-04 DIAGNOSIS — R079 Chest pain, unspecified: Secondary | ICD-10-CM

## 2011-09-04 DIAGNOSIS — R0781 Pleurodynia: Secondary | ICD-10-CM

## 2011-09-04 DIAGNOSIS — X58XXXA Exposure to other specified factors, initial encounter: Secondary | ICD-10-CM

## 2011-09-04 DIAGNOSIS — M25562 Pain in left knee: Secondary | ICD-10-CM

## 2011-09-04 DIAGNOSIS — M25569 Pain in unspecified knee: Secondary | ICD-10-CM

## 2011-09-04 DIAGNOSIS — E781 Pure hyperglyceridemia: Secondary | ICD-10-CM

## 2011-09-04 NOTE — Patient Instructions (Addendum)
Order for x-rays entered into  the computer; these will be performed at [facility]. No appointment is necessary.    Consider  [facility]'s smoking cessation program @ www.[employer].com or [phone].   The most common cause of elevated triglycerides is the ingestion of sugar from high fructose corn syrup sources added to processed foods & drinks.  Eat a low-fat diet with lots of fruits and vegetables, up to 7-9 servings per day. Consume less than 40 ( preferably ZERO) grams of sugar per day from foods & drinks with High Fructose Corn Syrup (HFCS) sugar as #1,2,3 or # 4 on label.Whole Foods, [employer] & [employer] do not carry products with HFCS. Follow a  low carb nutrition program such as [place] or [employer]  to prevent Diabetes progression . White carbohydrates (potatoes, rice, bread, and pasta) have a high spike of sugar and a high load of sugar. For example a  baked potato has a cup of sugar and a  french fry  2 teaspoons of sugar. Yams, wild  rice, whole grained bread &  wheat pasta have been much lower spike and load of  sugar. Portions should be the size of a deck of cards or your palm.  PLEASE BRING THESE INSTRUCTIONS TO FOLLOW UP  LAB APPOINTMENT in 12 weeks for A1c.This will guarantee correct labs are drawn, eliminating need for repeat blood sampling ( needle sticks ! ). Diagnoses /Codes: 277.7
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
## 2011-09-04 NOTE — Assessment & Plan Note (Signed)
The matter of competency to address his financial affairs needs to be discussed with Dr. Duayne Cal. Follow up appointment should be scheduled

## 2011-09-04 NOTE — Assessment & Plan Note (Signed)
A1c is in the nondiabetic range but his hypertriglyceridemia predisposes him to diabetes. Metformin will be stopped and an A1c checked in 3 months. Nutritional intervention again stressed

## 2011-09-17 ENCOUNTER — Other Ambulatory Visit: Payer: Self-pay | Admitting: *Deleted

## 2011-09-17 NOTE — Telephone Encounter (Signed)
Pt mom left VM stating that we need to order Pt Tricor. Called [phone] and placed order .

## 2011-12-02 ENCOUNTER — Encounter: Payer: Self-pay | Admitting: Internal Medicine

## 2011-12-03 ENCOUNTER — Other Ambulatory Visit: Payer: Self-pay

## 2011-12-03 NOTE — Telephone Encounter (Signed)
Message received through MyChart from patient's mother to have RX reordered through patient assistance.  I called [phone] and followed automated prompting and reordered med, med to be shipped here in 7-10 business days.

## 2011-12-19 ENCOUNTER — Ambulatory Visit (INDEPENDENT_AMBULATORY_CARE_PROVIDER_SITE_OTHER): Payer: Medicare Other | Admitting: Internal Medicine

## 2011-12-19 ENCOUNTER — Encounter: Payer: Self-pay | Admitting: Internal Medicine

## 2011-12-19 VITALS — BP 110/72 | HR 87 | Temp 97.7°F | Wt 233.6 lb

## 2011-12-19 DIAGNOSIS — R51 Headache: Secondary | ICD-10-CM

## 2011-12-19 DIAGNOSIS — E8881 Metabolic syndrome: Secondary | ICD-10-CM

## 2011-12-19 DIAGNOSIS — E781 Pure hyperglyceridemia: Secondary | ICD-10-CM

## 2011-12-19 MED ORDER — GABAPENTIN 100 MG PO CAPS: ORAL_CAPSULE | ORAL | Status: DC

## 2011-12-19 NOTE — Addendum Note (Signed)
Addended by: Koua Deeg D on: 12/19/2011 11:16 AM   Modules accepted: Orders  

## 2011-12-19 NOTE — Addendum Note (Signed)
Addended by: Silvio Pate D on: 12/19/2011 11:16 AM   Modules accepted: Orders

## 2011-12-19 NOTE — Patient Instructions (Addendum)
Please keep a diary of your headaches . Document  each occurrence on the calendar with notation of : #1 any prodrome ( any non headache symptom such as marked fatigue,visual changes, ,etc ) which precedes actual headache ; #2) severity on 1-10 scale; #3) any triggers ( food/ drink,enviromenntal or weather changes ,physical or emotional stress) in 8-12 hour period prior to the headache; & #4) response to any medications or other intervention. Please review "Headache" @ [facility] for additional information.    The most common cause of elevated triglycerides is the ingestion of sugar from high fructose corn syrup sources added to processed foods & drinks.  Eat a low-fat diet with lots of fruits and vegetables, up to 7-9 servings per day. Consume less than 40 (preferably ZERO) grams of sugar per day from foods & drinks with High Fructose Corn Syrup (HFCS) sugar as #1,2,3 or # 4 on label.Whole Foods, [employer] & [employer] do not carry products with HFCS. Follow a  low carb nutrition program such as [place] or [employer]  to prevent Diabetes progression . White carbohydrates (potatoes, rice, bread, and pasta) have a high spike of sugar and a high load of sugar. For example a  baked potato has a cup of sugar and a  french fry  2 teaspoons of sugar. Yams, wild  rice, whole grained bread &  wheat pasta have been much lower spike and load of  sugar. Portions should be the size of a deck of cards or your palm.
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
## 2011-12-19 NOTE — Progress Notes (Signed)
  Subjective:    Patient ID: [name] [name], male    DOB: 04-10-1966, 46 y.o.   MRN: [id number]  HPI    Review of Systems     Objective:   Physical Exam the operative scar on the left anterior skull is well healed. There is no significant deficit in this area despite the history of craniotomy        Assessment & Plan:

## 2012-03-02 ENCOUNTER — Encounter: Payer: Self-pay | Admitting: Internal Medicine

## 2012-03-08 ENCOUNTER — Other Ambulatory Visit: Payer: Self-pay

## 2012-03-08 NOTE — Telephone Encounter (Signed)
Called [phone] and placed order . Shipment to arrive in 7-10 days

## 2012-05-11 ENCOUNTER — Encounter: Payer: Self-pay | Admitting: Internal Medicine

## 2012-05-11 ENCOUNTER — Ambulatory Visit (INDEPENDENT_AMBULATORY_CARE_PROVIDER_SITE_OTHER): Payer: Medicare Other | Admitting: Internal Medicine

## 2012-05-11 VITALS — BP 116/80 | HR 93 | Temp 97.6°F | Wt 235.8 lb

## 2012-05-11 DIAGNOSIS — M542 Cervicalgia: Secondary | ICD-10-CM

## 2012-05-11 MED ORDER — CYCLOBENZAPRINE HCL 5 MG PO TABS: ORAL_TABLET | ORAL | Status: DC

## 2012-05-11 MED ORDER — CELECOXIB 200 MG PO CAPS: 200.0000 mg | ORAL_CAPSULE | Freq: Two times a day (BID) | ORAL | Status: DC

## 2012-05-11 NOTE — Progress Notes (Signed)
  Subjective:    Patient ID: [name] [name], male    DOB: 24-Dec-1965, 46 y.o.   MRN: [id number]  HPI NECK PAIN: Location: base of neck   Onset: 5-6 days ago   Severity: up to 7 in am Pain is described as: sharp   Worse with: with neck ROM   Better with: no benefit with NSAIDS or Tylenol . Changing his pillow did not help Pain radiates to: no   Impaired range of motion: no  History of repetitive motion: no History of overuse or hyperextension: no History of trauma: no   Past history of similar problem: no        Review of Systems Back Pain: no  Numbness/tingling:  no Weakness:  no Fever:  no Headache: essentially resolved Bowel/bladder dysfunction: no      Objective:   Physical Exam  He appears healthy and well-nourished in no acute distress  He has no lymphadenopathy about the head, neck, or axilla.  There is full range of motion of the neck. He describes discomfort with lateral rotation. Neck is supple to passive range of motion  The spine reveals no malalignment.  Strength, tone, deep tendon reflexes are normal in the upper and lower extremities. There is no weakness to opposition in the hands.  Straight leg raising is negative bilaterally            Assessment & Plan:  #1 neck pain with no neuromuscular deficit present  Plan: See orders and recommendations

## 2012-05-11 NOTE — Patient Instructions (Addendum)
Use a cervical memory foam pillow to prevent hyperextension or hyperflexion of the cervical spine. Use an anti-inflammatory cream such as Aspercreme or Zostrix cream twice a day to the neck as needed. In lieu of this warm moist compresses or  hot water bottle can be used. Do not apply ice to the neck

## 2012-05-12 ENCOUNTER — Ambulatory Visit (INDEPENDENT_AMBULATORY_CARE_PROVIDER_SITE_OTHER)
Admission: RE | Admit: 2012-05-12 | Discharge: 2012-05-12 | Disposition: A | Discharge status: Home / Self Care | Payer: Medicare Other | Source: Ambulatory Visit | Attending: Internal Medicine | Admitting: Internal Medicine

## 2012-05-12 ENCOUNTER — Telehealth: Payer: Self-pay | Admitting: Internal Medicine

## 2012-05-12 ENCOUNTER — Other Ambulatory Visit: Payer: Self-pay | Admitting: Internal Medicine

## 2012-05-12 DIAGNOSIS — M542 Cervicalgia: Secondary | ICD-10-CM

## 2012-05-12 NOTE — Telephone Encounter (Signed)
Hopp please advise  

## 2012-05-12 NOTE — Telephone Encounter (Signed)
Patient Information:  Caller Name: Maralyn Sago  Phone: [phone]  Patient: [name] [name], [name] [name]  Gender: Male  DOB: February 24, 1966  Age: 46 Years  PCP: Marga Melnick   Symptoms  Reason For Call & Symptoms: Mom calling, he is taking the medication given yesterday afternoon but the pain is worse.  Reviewed Health History In EMR: Yes  Reviewed Medications In EMR: Yes  Reviewed Allergies In EMR: Yes  Reviewed Surgeries / Procedures: Yes  Date of Onset of Symptoms: 05/09/2012  Treatments Tried: Flexoril, Celebrex, suggested pillow  Treatments Tried Worked: No  Guideline(s) Used:  No Protocol Available - Information Only  Disposition Per Guideline:   Discuss with PCP and Callback by Nurse within 1 Hour  Reason For Disposition Reached:   Nursing judgment  Advice Given:  N/A  Office Follow Up:  Does the office need to follow up with this patient?: Yes  Instructions For The Office: please check to see if Flexoril frequency can be changed  RN Note:  He lives behind her and called her this morning to let her know that his neck pain has not improved.  He took the medications as ordered and she said that they bought the pillow that was suggested.  He is not with her so a full triage could not be completed.  Suggested moist heat to the area.  His Flexoril 5mg  was ordered 1-2 at hs.....can the frequency be increased on this?

## 2012-05-12 NOTE — Telephone Encounter (Signed)
Order for x-rays entered into  the computer; these will be performed at [address] across from [facility]. No appointment is necessary. 

## 2012-05-12 NOTE — Telephone Encounter (Signed)
Discuss with patient  

## 2012-05-20 ENCOUNTER — Other Ambulatory Visit: Payer: Self-pay | Admitting: Gastroenterology

## 2012-05-21 NOTE — Telephone Encounter (Signed)
L/M that put samples out front for pt

## 2012-05-31 ENCOUNTER — Other Ambulatory Visit: Payer: Self-pay | Admitting: Internal Medicine

## 2012-05-31 DIAGNOSIS — E781 Pure hyperglyceridemia: Secondary | ICD-10-CM

## 2012-05-31 MED ORDER — FENOFIBRATE 145 MG PO TABS: 145.0000 mg | ORAL_TABLET | Freq: Every day | ORAL | Status: DC

## 2012-06-23 ENCOUNTER — Encounter: Payer: Self-pay | Admitting: Internal Medicine

## 2012-06-28 ENCOUNTER — Other Ambulatory Visit: Payer: Self-pay | Admitting: Internal Medicine

## 2012-06-28 DIAGNOSIS — E781 Pure hyperglyceridemia: Secondary | ICD-10-CM

## 2012-06-28 MED ORDER — FENOFIBRATE 145 MG PO TABS: 145.0000 mg | ORAL_TABLET | Freq: Every day | ORAL | Status: DC

## 2012-06-28 NOTE — Telephone Encounter (Signed)
RX sent, note made on rx-appointment due (patient will need to schedule a CPX, labs to be done at the same time or after-medicare)

## 2012-06-28 NOTE — Telephone Encounter (Signed)
refill TRICOR 145 MG Take 1 tablet (145 mg total) by mouth daily. #30 last fill per fax [phone]

## 2012-07-08 ENCOUNTER — Telehealth: Payer: Self-pay | Admitting: Internal Medicine

## 2012-07-08 NOTE — Telephone Encounter (Signed)
Refill: Gabapentin 100mg  cap. Take one capsule by mouth every 8 hours as needed. Dose may be increased by one capsule each dose after 72 hours if only partially eff.... Qty 30. Last fill 07-08-12.  **Patient states Dr. Catalina Pizza him he should be on brand neurontin. However e-scribe was sent for generic. Please advise if I should change.

## 2012-07-08 NOTE — Telephone Encounter (Signed)
I never prescribe branded Neurontin; I found that the generic gabapentin is extremely effective. Refill prescription #30

## 2012-07-08 NOTE — Telephone Encounter (Signed)
I spoke with patient's mother, Mrs.Kahan indicated she picked up rx today. Mrs.Moder verbalized understanding of response in ref to Branded name request. Mrs.Dani stated Gilman thought the Brand name would work better but is ok with generic

## 2012-07-08 NOTE — Telephone Encounter (Signed)
Left message for patient's mother to return call when available

## 2012-07-08 NOTE — Telephone Encounter (Signed)
Hopp please advise this medication is not on med list. **Please indicate if this is to be brand name only

## 2012-07-14 ENCOUNTER — Ambulatory Visit: Payer: Medicare Other | Attending: Orthopaedic Surgery | Admitting: Physical Therapy

## 2012-07-14 DIAGNOSIS — M25669 Stiffness of unspecified knee, not elsewhere classified: Secondary | ICD-10-CM | POA: Insufficient documentation

## 2012-07-14 DIAGNOSIS — M25569 Pain in unspecified knee: Secondary | ICD-10-CM | POA: Insufficient documentation

## 2012-07-14 DIAGNOSIS — IMO0001 Reserved for inherently not codable concepts without codable children: Secondary | ICD-10-CM | POA: Insufficient documentation

## 2012-07-14 DIAGNOSIS — R5381 Other malaise: Secondary | ICD-10-CM | POA: Insufficient documentation

## 2012-07-14 DIAGNOSIS — R269 Unspecified abnormalities of gait and mobility: Secondary | ICD-10-CM | POA: Insufficient documentation

## 2012-07-19 ENCOUNTER — Ambulatory Visit: Payer: Medicare Other | Admitting: Physical Therapy

## 2012-07-21 ENCOUNTER — Ambulatory Visit: Payer: Medicare Other | Admitting: Physical Therapy

## 2012-07-23 ENCOUNTER — Encounter: Payer: Medicare Other | Admitting: *Deleted

## 2012-07-24 ENCOUNTER — Other Ambulatory Visit: Payer: Self-pay

## 2012-07-26 ENCOUNTER — Ambulatory Visit: Payer: Medicare Other | Admitting: Physical Therapy

## 2012-07-28 ENCOUNTER — Ambulatory Visit: Payer: Medicare Other | Admitting: Physical Therapy

## 2012-07-29 ENCOUNTER — Ambulatory Visit: Payer: Medicare Other | Admitting: Physical Therapy

## 2012-08-03 ENCOUNTER — Ambulatory Visit: Payer: Medicare Other | Admitting: Physical Therapy

## 2012-08-05 ENCOUNTER — Ambulatory Visit: Payer: Medicare Other | Admitting: Physical Therapy

## 2012-08-06 ENCOUNTER — Ambulatory Visit: Payer: Medicare Other | Admitting: *Deleted

## 2012-08-09 ENCOUNTER — Ambulatory Visit: Payer: Medicare Other | Attending: Orthopaedic Surgery | Admitting: Physical Therapy

## 2012-08-09 DIAGNOSIS — M25669 Stiffness of unspecified knee, not elsewhere classified: Secondary | ICD-10-CM | POA: Insufficient documentation

## 2012-08-09 DIAGNOSIS — M25569 Pain in unspecified knee: Secondary | ICD-10-CM | POA: Insufficient documentation

## 2012-08-09 DIAGNOSIS — R5381 Other malaise: Secondary | ICD-10-CM | POA: Insufficient documentation

## 2012-08-09 DIAGNOSIS — R269 Unspecified abnormalities of gait and mobility: Secondary | ICD-10-CM | POA: Insufficient documentation

## 2012-08-09 DIAGNOSIS — IMO0001 Reserved for inherently not codable concepts without codable children: Secondary | ICD-10-CM | POA: Insufficient documentation

## 2012-08-11 ENCOUNTER — Ambulatory Visit: Payer: Medicare Other | Admitting: Physical Therapy

## 2012-08-13 ENCOUNTER — Encounter: Payer: Medicare Other | Admitting: Physical Therapy

## 2012-08-17 ENCOUNTER — Ambulatory Visit: Payer: Medicare Other | Admitting: Physical Therapy

## 2012-08-19 ENCOUNTER — Ambulatory Visit: Payer: Medicare Other | Admitting: Physical Therapy

## 2012-08-23 ENCOUNTER — Ambulatory Visit: Payer: Medicare Other | Admitting: Physical Therapy

## 2012-08-25 ENCOUNTER — Ambulatory Visit: Payer: Medicare Other | Admitting: Physical Therapy

## 2012-08-27 ENCOUNTER — Encounter: Payer: Medicare Other | Admitting: Physical Therapy

## 2012-08-30 ENCOUNTER — Encounter: Payer: Medicare Other | Admitting: Physical Therapy

## 2012-09-01 ENCOUNTER — Encounter: Payer: Medicare Other | Admitting: Physical Therapy

## 2012-09-03 ENCOUNTER — Ambulatory Visit: Payer: Medicare Other | Admitting: Physical Therapy

## 2012-09-07 ENCOUNTER — Ambulatory Visit: Payer: Medicare Other | Attending: Orthopaedic Surgery | Admitting: Physical Therapy

## 2012-09-07 DIAGNOSIS — R269 Unspecified abnormalities of gait and mobility: Secondary | ICD-10-CM | POA: Insufficient documentation

## 2012-09-07 DIAGNOSIS — M25669 Stiffness of unspecified knee, not elsewhere classified: Secondary | ICD-10-CM | POA: Insufficient documentation

## 2012-09-07 DIAGNOSIS — R5381 Other malaise: Secondary | ICD-10-CM | POA: Insufficient documentation

## 2012-09-07 DIAGNOSIS — M25569 Pain in unspecified knee: Secondary | ICD-10-CM | POA: Insufficient documentation

## 2012-09-07 DIAGNOSIS — IMO0001 Reserved for inherently not codable concepts without codable children: Secondary | ICD-10-CM | POA: Insufficient documentation

## 2012-09-13 ENCOUNTER — Ambulatory Visit: Payer: Medicare Other | Admitting: Physical Therapy

## 2012-09-13 ENCOUNTER — Telehealth: Payer: Self-pay | Admitting: Gastroenterology

## 2012-09-13 MED ORDER — DEXLANSOPRAZOLE 60 MG PO CPDR: 60.0000 mg | DELAYED_RELEASE_CAPSULE | Freq: Every day | ORAL | Status: DC

## 2012-09-13 NOTE — Telephone Encounter (Signed)
Spoke with Tobys mother that script was sent in to his pharmacy

## 2012-09-17 ENCOUNTER — Telehealth: Payer: Self-pay | Admitting: Gastroenterology

## 2012-09-17 NOTE — Telephone Encounter (Signed)
Told patient's mother I would leave some Dexilant samples up front for patient.  She agreed to pick up

## 2012-09-20 ENCOUNTER — Ambulatory Visit: Payer: Medicare Other | Admitting: *Deleted

## 2012-09-27 ENCOUNTER — Ambulatory Visit: Payer: Medicare Other | Admitting: Physical Therapy

## 2012-09-29 ENCOUNTER — Telehealth: Payer: Self-pay | Admitting: Gastroenterology

## 2012-10-06 ENCOUNTER — Telehealth: Payer: Self-pay | Admitting: Gastroenterology

## 2012-10-06 NOTE — Telephone Encounter (Signed)
papersd faxed to [facility] pt aware

## 2012-10-06 NOTE — Telephone Encounter (Signed)
Papers faxed today for [facility] pt coming to get samples today

## 2012-10-19 ENCOUNTER — Telehealth: Payer: Self-pay | Admitting: Internal Medicine

## 2012-10-19 DIAGNOSIS — E782 Mixed hyperlipidemia: Secondary | ICD-10-CM

## 2012-10-19 DIAGNOSIS — E8881 Metabolic syndrome: Secondary | ICD-10-CM

## 2012-10-19 NOTE — Telephone Encounter (Signed)
Orders placed.

## 2012-10-19 NOTE — Telephone Encounter (Signed)
Patient's mother states the patient is due for labs so he can refill his Tricor. He also needs labs for another physician and will be bring orders. Can you place orders for the labs he needs?

## 2012-10-20 ENCOUNTER — Encounter: Payer: Self-pay | Admitting: Lab

## 2012-10-21 ENCOUNTER — Other Ambulatory Visit (INDEPENDENT_AMBULATORY_CARE_PROVIDER_SITE_OTHER): Payer: Medicare Other

## 2012-10-21 DIAGNOSIS — E8881 Metabolic syndrome: Secondary | ICD-10-CM

## 2012-10-21 DIAGNOSIS — E782 Mixed hyperlipidemia: Secondary | ICD-10-CM

## 2012-10-21 DIAGNOSIS — Z79899 Other long term (current) drug therapy: Secondary | ICD-10-CM

## 2012-10-21 LAB — LIPID PANEL
HDL: 25.9 mg/dL — ABNORMAL LOW (ref >39.00)
Total CHOL/HDL Ratio: 10
VLDL: 74.8 mg/dL — ABNORMAL HIGH (ref 0.0–40.0)

## 2012-10-21 LAB — HEMOGLOBIN A1C: Hgb A1c MFr Bld: 6.3 % (ref 4.6–6.5)

## 2012-10-21 LAB — LDL CHOLESTEROL, DIRECT: Direct LDL: 148.7 mg/dL

## 2012-10-28 ENCOUNTER — Telehealth: Payer: Self-pay | Admitting: Gastroenterology

## 2012-10-29 NOTE — Telephone Encounter (Signed)
Medication for a 90 day supply called into [facility].  Patients mother aware

## 2012-11-08 ENCOUNTER — Encounter: Payer: Self-pay | Admitting: Internal Medicine

## 2012-11-08 ENCOUNTER — Ambulatory Visit (INDEPENDENT_AMBULATORY_CARE_PROVIDER_SITE_OTHER): Payer: Medicare Other | Admitting: Internal Medicine

## 2012-11-08 VITALS — BP 112/70 | HR 94 | Temp 97.6°F | Resp 12 | Ht 70.0 in | Wt 229.0 lb

## 2012-11-08 DIAGNOSIS — F17200 Nicotine dependence, unspecified, uncomplicated: Secondary | ICD-10-CM

## 2012-11-08 DIAGNOSIS — R7309 Other abnormal glucose: Secondary | ICD-10-CM

## 2012-11-08 DIAGNOSIS — E781 Pure hyperglyceridemia: Secondary | ICD-10-CM

## 2012-11-08 DIAGNOSIS — R7303 Prediabetes: Secondary | ICD-10-CM

## 2012-11-08 DIAGNOSIS — E8881 Metabolic syndrome: Secondary | ICD-10-CM

## 2012-11-08 MED ORDER — FENOFIBRATE MICRONIZED 134 MG PO CAPS: 134.0000 mg | ORAL_CAPSULE | Freq: Every day | ORAL | Status: DC

## 2012-11-08 NOTE — Assessment & Plan Note (Signed)
MI/CVA risks discussed

## 2012-11-08 NOTE — Patient Instructions (Addendum)
Please  schedule fasting Labs in 12 weeks after nutrition changes in the books we discussed (see handwritten notes) :lipids, TSH. PLEASE BRING THESE INSTRUCTIONS TO FOLLOW UP  LAB APPOINTMENT.This will guarantee correct labs are drawn, eliminating need for repeat blood sampling ( needle sticks ! ). Diagnoses/Codes: 277.7. Please think about quitting smoking. Review the risks we discussed. Please call 1-800-QUIT-NOW (469 455 1965) for free smoking cessation counseling.

## 2012-11-08 NOTE — Assessment & Plan Note (Addendum)
He declines referral to nutritionist. He states he cannot afford to shop at food stores which do not carry high fructose corn syrup in their products. Nutritional references provided and the pathophysiology of hypertriglyceridemia discussed .  Most cost effective fenofibrate agent  will be reinitiated.

## 2012-11-08 NOTE — Progress Notes (Signed)
  Subjective:    Patient ID: [name] [name], male    DOB: 01-04-1966, 47 y.o.   MRN: [id number]  HPI  He returns to evaluate his dyslipidemia; history this rise have ranged from a low of 294-high of 493; at this time it is 374. Correspondingly there's been a decrease in the HDL good cholesterol; it is now 25.9. His LDL is 148.7. He previously been on fenofibrate but is unsure why this was discontinued.  He is on no specific nutritional plan; he expresses no interest in seeing a nutritionist. He is not  exercising regularly. He is smoking a pack a day.  A1c is in the prediabetic range at 6.3%.      Review of Systems  He is taking Seroquel from a psychiatrist; he was told this might raise his sugar.     Objective:   Physical Exam Appears adequately nourished & in no acute distress  No carotid bruits are present.No neck pain distention present at 10 - 15 degrees. Thyroid normal to palpation  Heart rhythm and rate are normal with no significant murmurs or gallops.  Chest; minor scattered minimal rhonchi; no increased work of breathing  There is no evidence of aortic aneurysm or renal artery bruits  Abdomen: protuberant but soft with no organomegaly or masses. No HJR  No clubbing, cyanosis or edema present.  Pedal pulses are intact   No ischemic skin changes are present . Fingernails healthy   Alert and oriented. Strength, tone, DTRs reflexes normal          Assessment & Plan:  See Current Assessment & Plan in Problem List under specific Diagnosis

## 2013-01-17 ENCOUNTER — Encounter: Payer: Self-pay | Admitting: Internal Medicine

## 2013-01-17 ENCOUNTER — Ambulatory Visit (INDEPENDENT_AMBULATORY_CARE_PROVIDER_SITE_OTHER): Payer: Medicare Other | Admitting: Internal Medicine

## 2013-01-17 VITALS — BP 116/70 | HR 96 | Temp 98.2°F | Wt 238.0 lb

## 2013-01-17 DIAGNOSIS — R195 Other fecal abnormalities: Secondary | ICD-10-CM

## 2013-01-17 DIAGNOSIS — IMO0002 Reserved for concepts with insufficient information to code with codable children: Secondary | ICD-10-CM

## 2013-01-17 DIAGNOSIS — M545 Low back pain, unspecified: Secondary | ICD-10-CM

## 2013-01-17 DIAGNOSIS — M5417 Radiculopathy, lumbosacral region: Secondary | ICD-10-CM

## 2013-01-17 LAB — POCT URINALYSIS DIPSTICK
Blood, UA: NEGATIVE
Leukocytes, UA: NEGATIVE
Nitrite, UA: NEGATIVE
Protein, UA: NEGATIVE
Urobilinogen, UA: 0.2
pH, UA: 7

## 2013-01-17 LAB — CBC WITH DIFFERENTIAL/PLATELET
Basophils Absolute: 0.1 10*3/uL (ref 0.0–0.1)
Eosinophils Absolute: 0.3 10*3/uL (ref 0.0–0.7)
Hemoglobin: 13.9 g/dL (ref 13.0–17.0)
Lymphocytes Relative: 19.3 % (ref 12.0–46.0)
MCHC: 33.4 g/dL (ref 30.0–36.0)
Neutro Abs: 6.3 10*3/uL (ref 1.4–7.7)
Neutrophils Relative %: 70.6 % (ref 43.0–77.0)
Platelets: 216 10*3/uL (ref 150.0–400.0)
RDW: 15.9 % — ABNORMAL HIGH (ref 11.5–14.6)

## 2013-01-17 MED ORDER — GABAPENTIN 100 MG PO CAPS: ORAL_CAPSULE | ORAL | Status: DC

## 2013-01-17 NOTE — Patient Instructions (Addendum)
Please complete and return stool cards; these will determine whether there is any gastrointestinal bleeding risk. Reflux of gastric acid may be asymptomatic as this may occur mainly during sleep.The triggers for reflux  include stress; the "aspirin family" ; alcohol; peppermint; and caffeine (coffee, tea, cola, and chocolate). The aspirin family would include aspirin and the nonsteroidal agents such as ibuprofen &  Naproxen. Tylenol would not cause reflux. If having symptoms ; food & drink should be avoided for @ least 2 hours before going to bed.  
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
## 2013-01-19 ENCOUNTER — Telehealth: Payer: Self-pay | Admitting: *Deleted

## 2013-01-19 MED ORDER — CYCLOBENZAPRINE HCL 5 MG PO TABS: ORAL_TABLET | ORAL | Status: DC

## 2013-01-19 MED ORDER — PREDNISONE 10 MG PO TABS: ORAL_TABLET | ORAL | Status: DC

## 2013-01-19 NOTE — Telephone Encounter (Signed)
Hopp please advise  

## 2013-01-19 NOTE — Telephone Encounter (Signed)
Allergic to codeine and morphine; this severely restricts options.  Recommend cyclobenzaprine 5 mg one-2 at bedtime; dispense 10.  Prednisone 10 mg one twice a day with meals ;dispense 14.  Physical therapy or chiropractory if no better.

## 2013-01-19 NOTE — Telephone Encounter (Signed)
Spoke with patients mother and advised of recommendations. Sent Rx to pharmacy.

## 2013-01-19 NOTE — Telephone Encounter (Signed)
Pt mother states that patient is not tolerating the gabapentin. It is found to make nervous. Mother states that patient is still having pain. Would like to know what else can be done. Please advise.  SW//CMA

## 2013-01-27 ENCOUNTER — Encounter: Payer: Self-pay | Admitting: Internal Medicine

## 2013-01-28 ENCOUNTER — Telehealth: Payer: Self-pay | Admitting: Internal Medicine

## 2013-01-28 DIAGNOSIS — M549 Dorsalgia, unspecified: Secondary | ICD-10-CM

## 2013-01-28 NOTE — Telephone Encounter (Signed)
Left message on voicemail informing patient of Dr.Paz's plan of action in regards to his mychart message. Referral order placed

## 2013-01-28 NOTE — Telephone Encounter (Signed)
Please advise 

## 2013-01-28 NOTE — Telephone Encounter (Signed)
-----   Message from Launa Flight to Pecola Lawless, MD sent at 01/27/2013 8:13 PM ----- Alfonse Flavors, I am still having back pain. I have taken the CYCLOBENZAPRINE 5MG  AND PREDNISONE 10MG  and I'm still experiencing a lot of pain. I have not seen a CHIROPRACTOR. I saw Dr. Chase Picket, a Chiropractor, with neck pain his treatment did not help. My cell [phone]. Thanks Fredric Dine  Call pt: Needs ortho referral , please arrange Cont cyclobenzaprine Tylenol as needed

## 2013-03-11 ENCOUNTER — Other Ambulatory Visit (INDEPENDENT_AMBULATORY_CARE_PROVIDER_SITE_OTHER): Payer: Medicare Other

## 2013-03-11 DIAGNOSIS — R195 Other fecal abnormalities: Secondary | ICD-10-CM

## 2013-03-11 LAB — POC HEMOCCULT BLD/STL (HOME/3-CARD/SCREEN)

## 2013-03-15 ENCOUNTER — Other Ambulatory Visit: Payer: Self-pay | Admitting: Orthopaedic Surgery

## 2013-03-15 DIAGNOSIS — M549 Dorsalgia, unspecified: Secondary | ICD-10-CM

## 2013-03-15 DIAGNOSIS — R531 Weakness: Secondary | ICD-10-CM

## 2013-03-15 DIAGNOSIS — M5410 Radiculopathy, site unspecified: Secondary | ICD-10-CM

## 2013-03-22 ENCOUNTER — Ambulatory Visit
Admission: RE | Admit: 2013-03-22 | Discharge: 2013-03-22 | Disposition: A | Discharge status: Home / Self Care | Payer: Medicare Other | Source: Ambulatory Visit | Attending: Orthopaedic Surgery | Admitting: Orthopaedic Surgery

## 2013-03-22 VITALS — BP 108/65 | HR 88

## 2013-03-22 DIAGNOSIS — M5410 Radiculopathy, site unspecified: Secondary | ICD-10-CM

## 2013-03-22 DIAGNOSIS — R531 Weakness: Secondary | ICD-10-CM

## 2013-03-22 DIAGNOSIS — M549 Dorsalgia, unspecified: Secondary | ICD-10-CM

## 2013-03-22 MED ORDER — ONDANSETRON HCL 4 MG/2ML IJ SOLN
4.0000 mg | Freq: Once | INTRAMUSCULAR | Status: AC
  Administered 2013-03-22: 4 mg via INTRAMUSCULAR

## 2013-03-22 MED ORDER — MEPERIDINE HCL 100 MG/ML IJ SOLN
100.0000 mg | Freq: Once | INTRAMUSCULAR | Status: AC
  Administered 2013-03-22: 100 mg via INTRAMUSCULAR

## 2013-03-22 MED ORDER — IOHEXOL 180 MG/ML  SOLN
15.0000 mL | Freq: Once | INTRAMUSCULAR | Status: AC | PRN
  Administered 2013-03-22: 15 mL via INTRATHECAL

## 2013-03-22 MED ORDER — DIAZEPAM 5 MG PO TABS
10.0000 mg | ORAL_TABLET | Freq: Once | ORAL | Status: AC
  Administered 2013-03-22: 10 mg via ORAL

## 2013-04-14 ENCOUNTER — Encounter: Payer: Self-pay | Admitting: Internal Medicine

## 2013-04-14 ENCOUNTER — Ambulatory Visit (HOSPITAL_BASED_OUTPATIENT_CLINIC_OR_DEPARTMENT_OTHER)
Admission: RE | Admit: 2013-04-14 | Discharge: 2013-04-14 | Disposition: A | Discharge status: Home / Self Care | Payer: Medicare Other | Source: Ambulatory Visit | Attending: Internal Medicine | Admitting: Internal Medicine

## 2013-04-14 ENCOUNTER — Ambulatory Visit (INDEPENDENT_AMBULATORY_CARE_PROVIDER_SITE_OTHER): Payer: Medicare Other | Admitting: Internal Medicine

## 2013-04-14 ENCOUNTER — Other Ambulatory Visit: Payer: Self-pay

## 2013-04-14 VITALS — BP 120/84 | HR 99 | Temp 97.5°F

## 2013-04-14 DIAGNOSIS — K5900 Constipation, unspecified: Secondary | ICD-10-CM | POA: Insufficient documentation

## 2013-04-14 DIAGNOSIS — R141 Gas pain: Secondary | ICD-10-CM

## 2013-04-14 DIAGNOSIS — R1915 Other abnormal bowel sounds: Secondary | ICD-10-CM

## 2013-04-14 DIAGNOSIS — R109 Unspecified abdominal pain: Secondary | ICD-10-CM | POA: Insufficient documentation

## 2013-04-14 DIAGNOSIS — R140 Abdominal distension (gaseous): Secondary | ICD-10-CM

## 2013-04-14 DIAGNOSIS — Z189 Retained foreign body fragments, unspecified material: Secondary | ICD-10-CM | POA: Insufficient documentation

## 2013-04-14 NOTE — Patient Instructions (Addendum)
Order for x-rays entered into  the computer; these will be performed at [facility]. No appointment is necessary.   Do NOT eat until Xray results are back. If no bowel blockage ( ileus) ,stay on clear liquids for 48-72 hours or until bowels are normal.This would include  jello, sherbert (NOT ice cream), Lipton's chicken noodle soup(NOT cream based soups),Gatorade Lite, flat Ginger ale (without High Fructose Corn Syrup),dry toast or crackers, baked potato.No milk , dairy or grease until bowels are normal. Stop cholesterol medication until well.    If there is no bowel blockage ; MiraLax can be taken each day &  A fleets enema can be administered x1. Report increasing pain, fever or rectal bleeding. If symptoms persist or increase in severity; go to the emergency room.

## 2013-04-14 NOTE — Progress Notes (Signed)
  Subjective:    Patient ID: [name] [name], male    DOB: 24-Oct-1965, 47 y.o.   MRN: [id number]  HPI   He has had constipation for the last 4 days with no bowel movements whatsoever. This has not responded to over-the-counter laxatives and suppositories. He is unsure of the name of the products.  This is associated with sharp pain up to level VI in the lower quadrants of the abdomen which has lasted days. This is nonradiating; NSAIDS , 200 mg 3-4 /day,do help the pain. He's had associated sweats as well.  His past history includes GERD and Barrett's esophagus. He's had esophageal or stomach biopsies x4 the last few years. There is no recent colonoscopy on record. PMH of condyloma accuminatum. Last TSH on record 2010   Review of Systems  He denies fever, chills, melena, rectal bleeding, or diarrhea. He denies any apparent blockage with Rotation of the suppositories.  There is no dysuria, pyuria, or hematuria. He has no difficulty with urination.    Objective:   Physical Exam General appearance is one of adequate nourishment w/o distress.  Eyes: No conjunctival inflammation or scleral icterus is present.  Oral exam: Dental hygiene is good; lips and gums are healthy appearing.There is no oropharyngeal erythema or exudate noted.   Heart:  Normal rate and regular rhythm. S1 and S2 normal without gallop, murmur, click, rub or other extra sounds     Lungs:Chest clear to auscultation; no wheezes, rhonchi,rales ,or rubs present.No increased work of breathing.   Abdomen: bowel sounds markedly decreased, soft but distended & tender in BLQ  without masses, organomegaly or hernias noted.  No guarding or rebound . No tenderness over the flanks to percussion. Rectal declined  Musculoskeletal: Able to lie flat and sit up without help.  Gait normal  Skin:Warm & dry.  Intact without suspicious lesions or rashes ; no jaundice or tenting  Lymphatic: No lymphadenopathy is noted about the head, neck, or  axilla.                Assessment & Plan:  #1 severe constipation  #2 abdominal distention with markedly decreased bowel sounds without frank ileus  #3 recent increase ingestion of nonsteroidals in the context of Barrett's esophagus  Plan: See orders

## 2013-04-15 ENCOUNTER — Other Ambulatory Visit: Payer: Self-pay | Admitting: Internal Medicine

## 2013-04-15 DIAGNOSIS — D649 Anemia, unspecified: Secondary | ICD-10-CM | POA: Insufficient documentation

## 2013-04-15 LAB — CBC WITH DIFFERENTIAL/PLATELET
Basophils Absolute: 0 10*3/uL (ref 0.0–0.1)
Eosinophils Relative: 6.5 % — ABNORMAL HIGH (ref 0.0–5.0)
HCT: 38 % — ABNORMAL LOW (ref 39.0–52.0)
Hemoglobin: 12.8 g/dL — ABNORMAL LOW (ref 13.0–17.0)
Lymphs Abs: 1.3 10*3/uL (ref 0.7–4.0)
MCV: 91.3 fl (ref 78.0–100.0)
Monocytes Relative: 5.7 % (ref 3.0–12.0)
Neutro Abs: 6.9 10*3/uL (ref 1.4–7.7)
RDW: 15.3 % — ABNORMAL HIGH (ref 11.5–14.6)

## 2013-04-15 LAB — TSH: TSH: 0.81 u[IU]/mL (ref 0.35–5.50)

## 2013-05-02 ENCOUNTER — Emergency Department (HOSPITAL_COMMUNITY): Payer: Medicare Other

## 2013-05-02 ENCOUNTER — Encounter (HOSPITAL_COMMUNITY): Payer: Self-pay | Admitting: Emergency Medicine

## 2013-05-02 ENCOUNTER — Emergency Department (HOSPITAL_COMMUNITY)
Admission: EM | Admit: 2013-05-02 | Discharge: 2013-05-02 | Disposition: A | Discharge status: Home / Self Care | Payer: Medicare Other | Attending: Emergency Medicine | Admitting: Emergency Medicine

## 2013-05-02 DIAGNOSIS — Z7982 Long term (current) use of aspirin: Secondary | ICD-10-CM | POA: Insufficient documentation

## 2013-05-02 DIAGNOSIS — E7800 Pure hypercholesterolemia, unspecified: Secondary | ICD-10-CM | POA: Insufficient documentation

## 2013-05-02 DIAGNOSIS — E785 Hyperlipidemia, unspecified: Secondary | ICD-10-CM | POA: Insufficient documentation

## 2013-05-02 DIAGNOSIS — F319 Bipolar disorder, unspecified: Secondary | ICD-10-CM | POA: Insufficient documentation

## 2013-05-02 DIAGNOSIS — K219 Gastro-esophageal reflux disease without esophagitis: Secondary | ICD-10-CM | POA: Insufficient documentation

## 2013-05-02 DIAGNOSIS — R079 Chest pain, unspecified: Secondary | ICD-10-CM

## 2013-05-02 DIAGNOSIS — R0602 Shortness of breath: Secondary | ICD-10-CM | POA: Insufficient documentation

## 2013-05-02 DIAGNOSIS — Z79899 Other long term (current) drug therapy: Secondary | ICD-10-CM | POA: Insufficient documentation

## 2013-05-02 DIAGNOSIS — F17200 Nicotine dependence, unspecified, uncomplicated: Secondary | ICD-10-CM | POA: Insufficient documentation

## 2013-05-02 DIAGNOSIS — R0789 Other chest pain: Secondary | ICD-10-CM | POA: Insufficient documentation

## 2013-05-02 LAB — CBC WITH DIFFERENTIAL/PLATELET
Basophils Relative: 0 % (ref 0–1)
Eosinophils Absolute: 0.6 10*3/uL (ref 0.0–0.7)
HCT: 40.3 % (ref 39.0–52.0)
Hemoglobin: 13.7 g/dL (ref 13.0–17.0)
Lymphocytes Relative: 11 % — ABNORMAL LOW (ref 12–46)
Lymphs Abs: 1.2 10*3/uL (ref 0.7–4.0)
MCHC: 34 g/dL (ref 30.0–36.0)
Neutro Abs: 8.5 10*3/uL — ABNORMAL HIGH (ref 1.7–7.7)
Neutrophils Relative %: 77 % (ref 43–77)
Platelets: 229 10*3/uL (ref 150–400)
RBC: 4.35 MIL/uL (ref 4.22–5.81)
WBC: 11 10*3/uL — ABNORMAL HIGH (ref 4.0–10.5)

## 2013-05-02 LAB — COMPREHENSIVE METABOLIC PANEL
ALT: 75 U/L — ABNORMAL HIGH (ref 0–53)
Alkaline Phosphatase: 146 U/L — ABNORMAL HIGH (ref 39–117)
CO2: 23 mEq/L (ref 19–32)
Chloride: 100 mEq/L (ref 96–112)
Creatinine, Ser: 0.88 mg/dL (ref 0.50–1.35)
GFR calc Af Amer: >90 mL/min (ref >90)
Glucose, Bld: 103 mg/dL — ABNORMAL HIGH (ref 70–99)
Potassium: 3.9 mEq/L (ref 3.5–5.1)
Sodium: 135 mEq/L (ref 135–145)
Total Bilirubin: 0.4 mg/dL (ref 0.3–1.2)
Total Protein: 7.7 g/dL (ref 6.0–8.3)

## 2013-05-02 LAB — D-DIMER, QUANTITATIVE (NOT AT ARMC): D-Dimer, Quant: 0.39 ug/mL-FEU (ref 0.00–0.48)

## 2013-05-02 MED ORDER — PREDNISONE 10 MG PO TABS: ORAL_TABLET | ORAL | Status: DC

## 2013-05-02 MED ORDER — AZITHROMYCIN 250 MG PO TABS: 250.0000 mg | ORAL_TABLET | Freq: Every day | ORAL | Status: DC

## 2013-05-02 MED ORDER — ALBUTEROL SULFATE HFA 108 (90 BASE) MCG/ACT IN AERS
2.0000 | INHALATION_SPRAY | Freq: Once | RESPIRATORY_TRACT | Status: AC
  Administered 2013-05-02: 2 via RESPIRATORY_TRACT
  Filled 2013-05-02: qty 6.7

## 2013-05-02 MED ORDER — PREDNISONE 20 MG PO TABS
60.0000 mg | ORAL_TABLET | Freq: Once | ORAL | Status: AC
  Administered 2013-05-02: 60 mg via ORAL
  Filled 2013-05-02: qty 3

## 2013-05-02 NOTE — ED Notes (Signed)
Pt alert and mentating appropriately. NAD noted upon d/c. Pt leaving with mother. Pt ambulatory leaving ER with steady gait. Pt given d/c teaching, prescriptions and follow up care. Pt has no further questions upon d/c.

## 2013-05-02 NOTE — ED Notes (Addendum)
Cp started on sat hurts to take a deep breath and cough no radiation to neck shoulder or arm some sob he states no n/v took 4 baby asa
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
## 2013-05-03 ENCOUNTER — Ambulatory Visit (INDEPENDENT_AMBULATORY_CARE_PROVIDER_SITE_OTHER): Payer: Medicare Other | Admitting: Internal Medicine

## 2013-05-03 ENCOUNTER — Encounter: Payer: Self-pay | Admitting: Internal Medicine

## 2013-05-03 VITALS — BP 150/78 | HR 77 | Temp 96.8°F | Resp 13 | Ht 70.0 in | Wt 232.8 lb

## 2013-05-03 DIAGNOSIS — J45901 Unspecified asthma with (acute) exacerbation: Secondary | ICD-10-CM

## 2013-05-03 DIAGNOSIS — J441 Chronic obstructive pulmonary disease with (acute) exacerbation: Secondary | ICD-10-CM

## 2013-05-03 DIAGNOSIS — R0781 Pleurodynia: Secondary | ICD-10-CM

## 2013-05-03 DIAGNOSIS — R071 Chest pain on breathing: Secondary | ICD-10-CM

## 2013-05-03 DIAGNOSIS — J9819 Other pulmonary collapse: Secondary | ICD-10-CM

## 2013-05-03 DIAGNOSIS — J9811 Atelectasis: Secondary | ICD-10-CM

## 2013-05-03 MED ORDER — FLUTICASONE-SALMETEROL 250-50 MCG/DOSE IN AEPB: 1.0000 | INHALATION_SPRAY | Freq: Two times a day (BID) | RESPIRATORY_TRACT | Status: DC

## 2013-05-03 NOTE — Progress Notes (Signed)
Pre visit review using our clinic review tool, if applicable. No additional management support is needed unless otherwise documented below in the visit note. 

## 2013-05-03 NOTE — ED Provider Notes (Signed)
  Medical screening examination/treatment/procedure(s) were performed by non-physician practitioner and as supervising physician I was immediately available for consultation/collaboration.  EKG Interpretation    Date/Time:  Monday May 02 2013 09:07:53 EST Ventricular Rate:  111 PR Interval:  144 QRS Duration: 78 QT Interval:  326 QTC Calculation: 443 R Axis:   58 Text Interpretation:  Sinus tachycardia Otherwise normal ECG Sinus tachycardia Abnormal ekg Confirmed by Gerhard Munch  MD ([id number]) on 05/02/2013 11:01:45 AM               Gerhard Munch, MD 05/03/13 1134

## 2013-05-03 NOTE — Progress Notes (Signed)
  Subjective:    Patient ID: [name] [name], male    DOB: February 26, 1966, 47 y.o.   MRN: [id number]  HPI   The 05/02/13 emergency room records were reviewed. He was seen for left anterior chest pain. His d-dimer and troponin were normal or negative. He exhibited desaturation with O2 sats as low as 90%.  White count was mildly elevated at 11,000 with a left shift. Liver function tests were mildly elevated as well. Chest x-ray was interpreted as demonstrating atelectasis versus early infiltrates at the bases. These were personally reviewed with [name] [name]. Atelectasis is suggested rather than pneumonia.  He was placed on Z-Pak , albuterol MDI and prednisone. He is allergic to morphine and codeine; he does have tramadol which he is able to take. He has 2 ISdevices @ home  Now smoking < 1 ppd.   Review of Systems  He is not having fever or chills but had sweats particularly when he has pain.  His pain is now in the right axillary line in the infra-thoracic area.     Objective:   Physical Exam Gen.: Adequately nourished in appearance. Alert, appropriate and cooperative throughout exam. Eyes: No corneal or conjunctival inflammation noted. No icterus Ears: External  ear exam reveals no significant lesions or deformities. Canals clear .TMs normal.  Nose: External nasal exam reveals no deformity or inflammation. Nasal mucosa are pink and moist. No lesions or exudates noted.  Mouth: Oral mucosa and oropharynx reveal no lesions or exudates. Teeth in good repair. Neck: No deformities, masses, or tenderness noted.  Lungs: Coarse rhonchi especially in the upper lobes bilaterally in the posterior chest w/o  increased work of breathing. Heart: Normal rate and rhythm. Normal S1 and S2. No gallop, click, or rub. S4 w/o murmur.                                  Musculoskeletal/extremities:  No clubbing, cyanosis, edema, or significant extremity  deformity noted. Tone & strength normal. 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 Fingernail health good. Neurologic: Alert and oriented x3.       Skin: Intact without suspicious lesions or rashes. Damp Lymph: No cervical, axillary, or inguinal lymphadenopathy present. Psych: Mood and affect are slightly flat but normally interactive                                                                                        Assessment & Plan:  #1 asthmatic bronchitis exacerbation  #2 bibasilar atelectasis  #3 pleurisy  Plan: See orders

## 2013-05-03 NOTE — Patient Instructions (Addendum)
Atelectasis is a process in which there is not full inspiration of  the lungs; areas tend to fold on themselves. This can lead to secretion retention and active infection. Please  blowup at least 10  balloons a day to enhance inflation of the lungs and prevent atelectasis as we discussed. Please  repeat  Chest Xray in 7-8  days @ [address]. Advair one inhalation every 12 hours; gargle and spit after use.  With your acute lung condition; smoking will lead to increased secretion retention, airway blockage, and increased risk of pneumonia and hospitalization. Please use smokeless forms of tobacco as we discussed (SKOAL Bandits, chewing tobacco, or sucking on the unlit cigarette)

## 2013-05-04 ENCOUNTER — Encounter (HOSPITAL_COMMUNITY): Payer: Self-pay | Admitting: Emergency Medicine

## 2013-05-04 ENCOUNTER — Emergency Department (HOSPITAL_COMMUNITY)
Admission: EM | Admit: 2013-05-04 | Discharge: 2013-05-04 | Disposition: A | Discharge status: Home / Self Care | Payer: Medicare Other | Attending: Emergency Medicine | Admitting: Emergency Medicine

## 2013-05-04 ENCOUNTER — Emergency Department (HOSPITAL_COMMUNITY): Payer: Medicare Other

## 2013-05-04 DIAGNOSIS — F319 Bipolar disorder, unspecified: Secondary | ICD-10-CM | POA: Insufficient documentation

## 2013-05-04 DIAGNOSIS — F3289 Other specified depressive episodes: Secondary | ICD-10-CM | POA: Insufficient documentation

## 2013-05-04 DIAGNOSIS — K219 Gastro-esophageal reflux disease without esophagitis: Secondary | ICD-10-CM | POA: Insufficient documentation

## 2013-05-04 DIAGNOSIS — Z8701 Personal history of pneumonia (recurrent): Secondary | ICD-10-CM | POA: Insufficient documentation

## 2013-05-04 DIAGNOSIS — Z79899 Other long term (current) drug therapy: Secondary | ICD-10-CM | POA: Insufficient documentation

## 2013-05-04 DIAGNOSIS — IMO0002 Reserved for concepts with insufficient information to code with codable children: Secondary | ICD-10-CM | POA: Insufficient documentation

## 2013-05-04 DIAGNOSIS — M546 Pain in thoracic spine: Secondary | ICD-10-CM | POA: Insufficient documentation

## 2013-05-04 DIAGNOSIS — R071 Chest pain on breathing: Secondary | ICD-10-CM | POA: Insufficient documentation

## 2013-05-04 DIAGNOSIS — F17200 Nicotine dependence, unspecified, uncomplicated: Secondary | ICD-10-CM | POA: Insufficient documentation

## 2013-05-04 DIAGNOSIS — E785 Hyperlipidemia, unspecified: Secondary | ICD-10-CM | POA: Insufficient documentation

## 2013-05-04 DIAGNOSIS — F329 Major depressive disorder, single episode, unspecified: Secondary | ICD-10-CM | POA: Insufficient documentation

## 2013-05-04 DIAGNOSIS — J189 Pneumonia, unspecified organism: Secondary | ICD-10-CM | POA: Insufficient documentation

## 2013-05-04 DIAGNOSIS — R0789 Other chest pain: Secondary | ICD-10-CM

## 2013-05-04 HISTORY — DX: Pneumonia, unspecified organism: J18.9

## 2013-05-04 MED ORDER — OXYCODONE-ACETAMINOPHEN 5-325 MG PO TABS: 1.0000 | ORAL_TABLET | ORAL | Status: DC | PRN

## 2013-05-04 MED ORDER — ALBUTEROL SULFATE (5 MG/ML) 0.5% IN NEBU
5.0000 mg | INHALATION_SOLUTION | Freq: Once | RESPIRATORY_TRACT | Status: AC
  Administered 2013-05-04: 5 mg via RESPIRATORY_TRACT
  Filled 2013-05-04: qty 1

## 2013-05-04 MED ORDER — OXYCODONE-ACETAMINOPHEN 5-325 MG PO TABS
2.0000 | ORAL_TABLET | Freq: Once | ORAL | Status: AC
  Administered 2013-05-04: 2 via ORAL
  Filled 2013-05-04: qty 2

## 2013-05-04 MED ORDER — KETOROLAC TROMETHAMINE 60 MG/2ML IM SOLN
60.0000 mg | Freq: Once | INTRAMUSCULAR | Status: AC
  Administered 2013-05-04: 60 mg via INTRAMUSCULAR
  Filled 2013-05-04: qty 2
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
## 2013-05-04 NOTE — ED Notes (Signed)
Patient transported to X-ray 

## 2013-05-04 NOTE — ED Notes (Signed)
Pt reports being seen here on 11/24 and diagnosed with pneumonia, has been taking antibiotics as prescribed but no relief. Having sob and right side rib pain that increases with breathing and  Coughing. spo2 91% on room air.

## 2013-05-09 ENCOUNTER — Ambulatory Visit: Payer: Medicare Other | Admitting: Internal Medicine

## 2013-05-13 ENCOUNTER — Ambulatory Visit (HOSPITAL_BASED_OUTPATIENT_CLINIC_OR_DEPARTMENT_OTHER)
Admission: RE | Admit: 2013-05-13 | Discharge: 2013-05-13 | Disposition: A | Discharge status: Home / Self Care | Payer: Medicare Other | Source: Ambulatory Visit | Attending: Internal Medicine | Admitting: Internal Medicine

## 2013-05-13 ENCOUNTER — Other Ambulatory Visit: Payer: Self-pay | Admitting: Internal Medicine

## 2013-05-13 DIAGNOSIS — R0789 Other chest pain: Secondary | ICD-10-CM

## 2013-05-13 DIAGNOSIS — R05 Cough: Secondary | ICD-10-CM | POA: Insufficient documentation

## 2013-05-13 DIAGNOSIS — R079 Chest pain, unspecified: Secondary | ICD-10-CM | POA: Insufficient documentation

## 2013-05-13 DIAGNOSIS — Z8701 Personal history of pneumonia (recurrent): Secondary | ICD-10-CM | POA: Insufficient documentation

## 2013-05-13 DIAGNOSIS — R059 Cough, unspecified: Secondary | ICD-10-CM | POA: Insufficient documentation

## 2013-05-13 DIAGNOSIS — J9819 Other pulmonary collapse: Secondary | ICD-10-CM | POA: Insufficient documentation

## 2013-06-06 ENCOUNTER — Emergency Department (HOSPITAL_COMMUNITY): Payer: Medicare Other

## 2013-06-06 ENCOUNTER — Encounter (HOSPITAL_COMMUNITY): Payer: Self-pay | Admitting: Emergency Medicine

## 2013-06-06 ENCOUNTER — Emergency Department (HOSPITAL_COMMUNITY)
Admission: EM | Admit: 2013-06-06 | Discharge: 2013-06-06 | Disposition: A | Discharge status: Home / Self Care | Payer: Medicare Other | Attending: Emergency Medicine | Admitting: Emergency Medicine

## 2013-06-06 DIAGNOSIS — S39012A Strain of muscle, fascia and tendon of lower back, initial encounter: Secondary | ICD-10-CM

## 2013-06-06 DIAGNOSIS — F3130 Bipolar disorder, current episode depressed, mild or moderate severity, unspecified: Secondary | ICD-10-CM | POA: Insufficient documentation

## 2013-06-06 DIAGNOSIS — Z9889 Other specified postprocedural states: Secondary | ICD-10-CM | POA: Insufficient documentation

## 2013-06-06 DIAGNOSIS — Z8701 Personal history of pneumonia (recurrent): Secondary | ICD-10-CM | POA: Insufficient documentation

## 2013-06-06 DIAGNOSIS — Y9389 Activity, other specified: Secondary | ICD-10-CM | POA: Insufficient documentation

## 2013-06-06 DIAGNOSIS — S3981XA Other specified injuries of abdomen, initial encounter: Secondary | ICD-10-CM | POA: Insufficient documentation

## 2013-06-06 DIAGNOSIS — K2270 Barrett's esophagus without dysplasia: Secondary | ICD-10-CM | POA: Insufficient documentation

## 2013-06-06 DIAGNOSIS — E785 Hyperlipidemia, unspecified: Secondary | ICD-10-CM | POA: Insufficient documentation

## 2013-06-06 DIAGNOSIS — IMO0002 Reserved for concepts with insufficient information to code with codable children: Secondary | ICD-10-CM | POA: Insufficient documentation

## 2013-06-06 DIAGNOSIS — Y929 Unspecified place or not applicable: Secondary | ICD-10-CM | POA: Insufficient documentation

## 2013-06-06 DIAGNOSIS — Z79899 Other long term (current) drug therapy: Secondary | ICD-10-CM | POA: Insufficient documentation

## 2013-06-06 DIAGNOSIS — X500XXA Overexertion from strenuous movement or load, initial encounter: Secondary | ICD-10-CM | POA: Insufficient documentation

## 2013-06-06 DIAGNOSIS — S335XXA Sprain of ligaments of lumbar spine, initial encounter: Secondary | ICD-10-CM | POA: Insufficient documentation

## 2013-06-06 DIAGNOSIS — F17200 Nicotine dependence, unspecified, uncomplicated: Secondary | ICD-10-CM | POA: Insufficient documentation

## 2013-06-06 DIAGNOSIS — T148XXA Other injury of unspecified body region, initial encounter: Secondary | ICD-10-CM

## 2013-06-06 MED ORDER — ORPHENADRINE CITRATE ER 100 MG PO TB12: 100.0000 mg | ORAL_TABLET | Freq: Two times a day (BID) | ORAL | Status: DC

## 2013-06-06 MED ORDER — HYDROCODONE-ACETAMINOPHEN 5-325 MG PO TABS
1.0000 | ORAL_TABLET | Freq: Once | ORAL | Status: AC
  Administered 2013-06-06: 1 via ORAL
  Filled 2013-06-06: qty 1

## 2013-06-06 MED ORDER — HYDROCODONE-ACETAMINOPHEN 5-325 MG PO TABS: 1.0000 | ORAL_TABLET | ORAL | Status: DC | PRN

## 2013-06-06 MED ORDER — CYCLOBENZAPRINE HCL 10 MG PO TABS
10.0000 mg | ORAL_TABLET | Freq: Once | ORAL | Status: AC
  Administered 2013-06-06: 10 mg via ORAL
  Filled 2013-06-06: qty 1
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
## 2013-06-06 NOTE — ED Notes (Signed)
Pt was lifting a microwave oven this am and felt a tearing sensation in his r/side

## 2013-06-06 NOTE — ED Provider Notes (Signed)
Medical screening examination/treatment/procedure(s) were performed by non-physician practitioner and as supervising physician I was immediately available for consultation/collaboration.  EKG Interpretation   None         Dagmar Hait, MD 06/06/13 1525

## 2013-06-16 ENCOUNTER — Ambulatory Visit (INDEPENDENT_AMBULATORY_CARE_PROVIDER_SITE_OTHER): Payer: Medicare Other | Admitting: Internal Medicine

## 2013-06-16 ENCOUNTER — Encounter: Payer: Self-pay | Admitting: Internal Medicine

## 2013-06-16 VITALS — BP 141/92 | HR 99 | Temp 98.0°F | Wt 220.0 lb

## 2013-06-16 DIAGNOSIS — M545 Low back pain, unspecified: Secondary | ICD-10-CM

## 2013-06-16 MED ORDER — CYCLOBENZAPRINE HCL 5 MG PO TABS: ORAL_TABLET | ORAL | Status: DC

## 2013-06-16 MED ORDER — GABAPENTIN 100 MG PO CAPS: ORAL_CAPSULE | ORAL | Status: DC

## 2013-06-16 NOTE — Progress Notes (Signed)
   Subjective:    Patient ID: [name] [name], male    DOB: December 26, 1965, 48 y.o.   MRN: [id number]  HPI   He strained his mid low back when he turned while carrying a 50-75# microwave. He was seen in the emergency room 12/29. That record and imaging were reviewed. There were no bony abnormalities. His chest x-ray did show right lower lobe atelectasis. He was using Percocet prescribed for pleuritic type pain with some benefit  He was referred to Dr. Jacelyn Grip, Orthopedist,who injected his back this week but he's continued to have progressive lower back pain. Generic Norco Rxed by Dr Jacelyn Grip has not helped.  He also describes sweats.       Review of Systems He is not having fever, chills, unexplained weight loss, excessive fatigue. There is no associated chest pain, shortness of breath, abdominal pain, melena, rectal bleeding, or change in bowels. He has no dysuria, pyuria, or pyuria.  He does describe some tingling in the arms but not the legs. There is no associated weakness or numbness in extremities. He is not having difficulty with balance or walking. He has no incontinence of urine and stool He has not noted no change in color or temperature of skin at the area of the pain.  The chest x-ray did demonstrate atelectasis mainly in the right lower lobe. He states that he is blowing up balloons as instructed.      Objective:   Physical Exam Gen.: adequately nourished in appearance but centrally obese. Alert, appropriate and cooperative throughout exam. Eyes: No corneal or conjunctival inflammation noted. Pupils equal round reactive to light and accommodation. Extraocular motion intact.  Lungs: He exhibits dry bibasilar rales. No increased work of breathing.  Heart: Regular rhythm and rate. Normal S1 and S2. No gallop, click, or rub. No murmur. Abdomen: Bowel sounds decreased; abdomen soft and nontender. No masses, organomegaly or hernias noted. Protuberant.Eschar to R of umbilicus 4X4 mm                          Musculoskeletal/extremities: No deformity or scoliosis noted of  the thoracic or lumbar spine. 2 faint ecchymotic areas to the left of the mid and lower spine.  No clubbing, cyanosis, edema, or significant extremity  deformity noted. Range of motion normal .Tone & strength normal.  Able to lie down & sit up w/o help. Negative SLR bilaterally. He exhibits a classic "low back crawl" sitting up from the table.  Neurologic: Alert and oriented x3. Deep tendon reflexes symmetrical and normal.  Gait normal  including heel & toe walking .      Skin: Intact without suspicious lesions or rashes. Lymph: No cervical, axillary, or inguinal lymphadenopathy present. Psych: Mood and affect are normal. Normally interactive . Frustrated by  pain                                                                                      Assessment & Plan:  #1 acute low back syndrome, musculoskeletal  #2 high risk for narcotic-related constipation  #3 atelectasis  Plan: Options discussed see recommendations

## 2013-06-16 NOTE — Progress Notes (Signed)
Pre visit review using our clinic review tool, if applicable. No additional management support is needed unless otherwise documented below in the visit note. 

## 2013-06-16 NOTE — Patient Instructions (Signed)
Use an anti-inflammatory cream such as Aspercreme or Zostrix cream twice a day to the affected area as needed. In lieu of this warm moist compresses or  hot water bottle can be used. Do not apply ice . 

## 2013-06-17 ENCOUNTER — Telehealth: Payer: Self-pay | Admitting: Internal Medicine

## 2013-06-17 NOTE — Telephone Encounter (Signed)
Relevant patient education assigned to patient using Emmi. ° °

## 2013-06-23 ENCOUNTER — Ambulatory Visit: Payer: Medicare Other | Attending: Internal Medicine | Admitting: Physical Therapy

## 2013-06-23 DIAGNOSIS — M545 Low back pain, unspecified: Secondary | ICD-10-CM | POA: Insufficient documentation

## 2013-06-23 DIAGNOSIS — R5381 Other malaise: Secondary | ICD-10-CM | POA: Insufficient documentation

## 2013-06-23 DIAGNOSIS — IMO0001 Reserved for inherently not codable concepts without codable children: Secondary | ICD-10-CM | POA: Insufficient documentation

## 2013-06-27 ENCOUNTER — Ambulatory Visit: Payer: Medicare Other | Admitting: Physical Therapy

## 2013-06-30 ENCOUNTER — Ambulatory Visit: Payer: Medicare Other | Admitting: Physical Therapy

## 2013-07-01 ENCOUNTER — Encounter: Payer: Medicare Other | Admitting: Physical Therapy

## 2013-07-05 ENCOUNTER — Ambulatory Visit: Payer: Medicare Other | Admitting: Physical Therapy

## 2013-07-07 ENCOUNTER — Telehealth: Payer: Self-pay | Admitting: *Deleted

## 2013-07-07 ENCOUNTER — Ambulatory Visit: Payer: Medicare Other | Admitting: Physical Therapy

## 2013-07-07 ENCOUNTER — Encounter: Payer: Self-pay | Admitting: Gastroenterology

## 2013-07-07 NOTE — Telephone Encounter (Signed)
Pt was seen at [facility] 07/04/2013 for chest pains. Mother states Dr called and spoke to Dr. Linna Darner.  Mother called to find out if there are any instructions from Dr. Linna Darner or if he needs to follow up with [facility].  Please advise.  bw

## 2013-07-11 ENCOUNTER — Ambulatory Visit: Payer: Medicare Other | Attending: Internal Medicine | Admitting: Physical Therapy

## 2013-07-11 DIAGNOSIS — IMO0001 Reserved for inherently not codable concepts without codable children: Secondary | ICD-10-CM | POA: Insufficient documentation

## 2013-07-11 DIAGNOSIS — M545 Low back pain, unspecified: Secondary | ICD-10-CM | POA: Insufficient documentation

## 2013-07-11 DIAGNOSIS — R5381 Other malaise: Secondary | ICD-10-CM | POA: Insufficient documentation

## 2013-07-11 NOTE — Telephone Encounter (Signed)
Please advice.//AB/CMA

## 2013-07-11 NOTE — Telephone Encounter (Signed)
Please sign a release of records to [facility] for records related to that visit for my review

## 2013-07-13 NOTE — Telephone Encounter (Signed)
Spoke with the pt's mother and informed her of Dr. Clayborn Heron recommendation below.  She stated that she will have the pt go by [facility] to sign a release so we can get his records when he was seen there.//AB/CMA

## 2013-07-14 ENCOUNTER — Encounter: Payer: Medicare Other | Admitting: Physical Therapy

## 2013-07-18 ENCOUNTER — Ambulatory Visit: Payer: Medicare Other | Admitting: Physical Therapy

## 2013-07-20 ENCOUNTER — Encounter: Payer: Self-pay | Admitting: Internal Medicine

## 2013-07-21 ENCOUNTER — Ambulatory Visit: Payer: Medicare Other | Admitting: Physical Therapy

## 2013-07-22 ENCOUNTER — Encounter: Payer: Self-pay | Admitting: Internal Medicine

## 2013-07-24 ENCOUNTER — Encounter: Payer: Self-pay | Admitting: Internal Medicine

## 2013-07-24 DIAGNOSIS — R9389 Abnormal findings on diagnostic imaging of other specified body structures: Secondary | ICD-10-CM | POA: Insufficient documentation

## 2013-07-25 ENCOUNTER — Ambulatory Visit: Payer: Medicare Other | Admitting: Physical Therapy

## 2013-07-28 ENCOUNTER — Ambulatory Visit: Payer: Medicare Other | Admitting: Physical Therapy

## 2013-07-30 ENCOUNTER — Emergency Department (HOSPITAL_COMMUNITY)
Admission: EM | Admit: 2013-07-30 | Discharge: 2013-07-30 | Disposition: A | Discharge status: Home / Self Care | Payer: Medicare Other | Attending: Emergency Medicine | Admitting: Emergency Medicine

## 2013-07-30 ENCOUNTER — Encounter (HOSPITAL_COMMUNITY): Payer: Self-pay | Admitting: Emergency Medicine

## 2013-07-30 DIAGNOSIS — Z79899 Other long term (current) drug therapy: Secondary | ICD-10-CM | POA: Insufficient documentation

## 2013-07-30 DIAGNOSIS — Z87828 Personal history of other (healed) physical injury and trauma: Secondary | ICD-10-CM | POA: Insufficient documentation

## 2013-07-30 DIAGNOSIS — M549 Dorsalgia, unspecified: Secondary | ICD-10-CM

## 2013-07-30 DIAGNOSIS — F319 Bipolar disorder, unspecified: Secondary | ICD-10-CM | POA: Insufficient documentation

## 2013-07-30 DIAGNOSIS — R6883 Chills (without fever): Secondary | ICD-10-CM | POA: Insufficient documentation

## 2013-07-30 DIAGNOSIS — R634 Abnormal weight loss: Secondary | ICD-10-CM | POA: Insufficient documentation

## 2013-07-30 DIAGNOSIS — K2270 Barrett's esophagus without dysplasia: Secondary | ICD-10-CM | POA: Insufficient documentation

## 2013-07-30 DIAGNOSIS — M795 Residual foreign body in soft tissue: Secondary | ICD-10-CM | POA: Insufficient documentation

## 2013-07-30 DIAGNOSIS — E785 Hyperlipidemia, unspecified: Secondary | ICD-10-CM | POA: Insufficient documentation

## 2013-07-30 DIAGNOSIS — Z8701 Personal history of pneumonia (recurrent): Secondary | ICD-10-CM | POA: Insufficient documentation

## 2013-07-30 DIAGNOSIS — M545 Low back pain, unspecified: Secondary | ICD-10-CM | POA: Insufficient documentation

## 2013-07-30 DIAGNOSIS — G8929 Other chronic pain: Secondary | ICD-10-CM | POA: Insufficient documentation

## 2013-07-30 DIAGNOSIS — R3919 Other difficulties with micturition: Secondary | ICD-10-CM | POA: Insufficient documentation

## 2013-07-30 DIAGNOSIS — F17200 Nicotine dependence, unspecified, uncomplicated: Secondary | ICD-10-CM | POA: Insufficient documentation

## 2013-07-30 DIAGNOSIS — K219 Gastro-esophageal reflux disease without esophagitis: Secondary | ICD-10-CM | POA: Insufficient documentation

## 2013-07-30 DIAGNOSIS — M546 Pain in thoracic spine: Secondary | ICD-10-CM | POA: Insufficient documentation

## 2013-07-30 MED ORDER — CYCLOBENZAPRINE HCL 10 MG PO TABS: 10.0000 mg | ORAL_TABLET | Freq: Two times a day (BID) | ORAL | Status: DC | PRN

## 2013-07-30 MED ORDER — OXYCODONE-ACETAMINOPHEN 5-325 MG PO TABS
2.0000 | ORAL_TABLET | Freq: Once | ORAL | Status: AC
  Administered 2013-07-30: 2 via ORAL
  Filled 2013-07-30: qty 2

## 2013-07-30 MED ORDER — OXYCODONE-ACETAMINOPHEN 5-325 MG PO TABS: 2.0000 | ORAL_TABLET | Freq: Four times a day (QID) | ORAL | Status: DC | PRN

## 2013-07-30 NOTE — Discharge Instructions (Signed)
Back Pain, Adult Low back pain is very common. About 1 in 5 people have back pain.The cause of low back pain is rarely dangerous. The pain often gets better over time.About half of people with a sudden onset of back pain feel better in just 2 weeks. About 8 in 10 people feel better by 6 weeks.  CAUSES Some common causes of back pain include:  Strain of the muscles or ligaments supporting the spine.  Wear and tear (degeneration) of the spinal discs.  Arthritis.  Direct injury to the back. DIAGNOSIS Most of the time, the direct cause of low back pain is not known.However, back pain can be treated effectively even when the exact cause of the pain is unknown.Answering your caregiver's questions about your overall health and symptoms is one of the most accurate ways to make sure the cause of your pain is not dangerous. If your caregiver needs more information, he or she may order lab work or imaging tests (X-rays or MRIs).However, even if imaging tests show changes in your back, this usually does not require surgery. HOME CARE INSTRUCTIONS For many people, back pain returns.Since low back pain is rarely dangerous, it is often a condition that people can learn to manageon their own.   Remain active. It is stressful on the back to sit or stand in one place. Do not sit, drive, or stand in one place for more than 30 minutes at a time. Take short walks on level surfaces as soon as pain allows.Try to increase the length of time you walk each day.  Do not stay in bed.Resting more than 1 or 2 days can delay your recovery.  Do not avoid exercise or work.Your body is made to move.It is not dangerous to be active, even though your back may hurt.Your back will likely heal faster if you return to being active before your pain is gone.  Pay attention to your body when you bend and lift. Many people have less discomfortwhen lifting if they bend their knees, keep the load close to their bodies,and  avoid twisting. Often, the most comfortable positions are those that put less stress on your recovering back.  Find a comfortable position to sleep. Use a firm mattress and lie on your side with your knees slightly bent. If you lie on your back, put a pillow under your knees.  Only take over-the-counter or prescription medicines as directed by your caregiver. Over-the-counter medicines to reduce pain and inflammation are often the most helpful.Your caregiver may prescribe muscle relaxant drugs.These medicines help dull your pain so you can more quickly return to your normal activities and healthy exercise.  Put ice on the injured area.  Put ice in a plastic bag.  Place a towel between your skin and the bag.  Leave the ice on for 15-20 minutes, 03-04 times a day for the first 2 to 3 days. After that, ice and heat may be alternated to reduce pain and spasms.  Ask your caregiver about trying back exercises and gentle massage. This may be of some benefit.  Avoid feeling anxious or stressed.Stress increases muscle tension and can worsen back pain.It is important to recognize when you are anxious or stressed and learn ways to manage it.Exercise is a great option. SEEK MEDICAL CARE IF:  You have pain that is not relieved with rest or medicine.  You have pain that does not improve in 1 week.  You have new symptoms.  You are generally not feeling well. SEEK   IMMEDIATE MEDICAL CARE IF:   You have pain that radiates from your back into your legs.  You develop new bowel or bladder control problems.  You have unusual weakness or numbness in your arms or legs.  You develop nausea or vomiting.  You develop abdominal pain.  You feel faint. Document Released: 05/26/2005 Document Revised: 11/25/2011 Document Reviewed: 10/14/2010 [employer] Patient Information 2014 [employer].  

## 2013-07-30 NOTE — ED Notes (Signed)
Pt in c/o lower back pain, states he has chronic back pain, this pain radiates down his right leg, history of same, states pain is worse with any movement including breathing, no relief with OTC medications
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
## 2013-07-31 NOTE — ED Provider Notes (Signed)
Medical screening examination/treatment/procedure(s) were performed by non-physician practitioner and as supervising physician I was immediately available for consultation/collaboration.   Babette Relic, MD 07/31/13 [phone]

## 2013-08-05 ENCOUNTER — Emergency Department (HOSPITAL_COMMUNITY): Payer: Medicare Other

## 2013-08-05 ENCOUNTER — Encounter (HOSPITAL_COMMUNITY): Payer: Self-pay | Admitting: Emergency Medicine

## 2013-08-05 ENCOUNTER — Inpatient Hospital Stay (HOSPITAL_COMMUNITY)
Admission: EM | Admit: 2013-08-05 | Discharge: 2013-08-07 | DRG: 918 | Disposition: A | Discharge status: Home / Self Care | Payer: Medicare Other | Attending: Internal Medicine | Admitting: Internal Medicine

## 2013-08-05 DIAGNOSIS — Z888 Allergy status to other drugs, medicaments and biological substances status: Secondary | ICD-10-CM

## 2013-08-05 DIAGNOSIS — T43502A Poisoning by unspecified antipsychotics and neuroleptics, intentional self-harm, initial encounter: Secondary | ICD-10-CM | POA: Diagnosis present

## 2013-08-05 DIAGNOSIS — K219 Gastro-esophageal reflux disease without esophagitis: Secondary | ICD-10-CM | POA: Diagnosis present

## 2013-08-05 DIAGNOSIS — F319 Bipolar disorder, unspecified: Secondary | ICD-10-CM

## 2013-08-05 DIAGNOSIS — T43591A Poisoning by other antipsychotics and neuroleptics, accidental (unintentional), initial encounter: Secondary | ICD-10-CM

## 2013-08-05 DIAGNOSIS — E785 Hyperlipidemia, unspecified: Secondary | ICD-10-CM | POA: Diagnosis present

## 2013-08-05 DIAGNOSIS — F17200 Nicotine dependence, unspecified, uncomplicated: Secondary | ICD-10-CM | POA: Diagnosis present

## 2013-08-05 DIAGNOSIS — R7309 Other abnormal glucose: Secondary | ICD-10-CM

## 2013-08-05 DIAGNOSIS — G8929 Other chronic pain: Secondary | ICD-10-CM | POA: Diagnosis present

## 2013-08-05 DIAGNOSIS — R7301 Impaired fasting glucose: Secondary | ICD-10-CM | POA: Diagnosis present

## 2013-08-05 DIAGNOSIS — J9801 Acute bronchospasm: Secondary | ICD-10-CM | POA: Diagnosis present

## 2013-08-05 DIAGNOSIS — Z79899 Other long term (current) drug therapy: Secondary | ICD-10-CM

## 2013-08-05 DIAGNOSIS — M549 Dorsalgia, unspecified: Secondary | ICD-10-CM | POA: Diagnosis present

## 2013-08-05 DIAGNOSIS — T50901A Poisoning by unspecified drugs, medicaments and biological substances, accidental (unintentional), initial encounter: Secondary | ICD-10-CM | POA: Diagnosis present

## 2013-08-05 DIAGNOSIS — R739 Hyperglycemia, unspecified: Secondary | ICD-10-CM | POA: Diagnosis present

## 2013-08-05 DIAGNOSIS — D649 Anemia, unspecified: Secondary | ICD-10-CM | POA: Diagnosis present

## 2013-08-05 DIAGNOSIS — K2270 Barrett's esophagus without dysplasia: Secondary | ICD-10-CM | POA: Diagnosis present

## 2013-08-05 DIAGNOSIS — E876 Hypokalemia: Secondary | ICD-10-CM | POA: Diagnosis present

## 2013-08-05 DIAGNOSIS — T438X2A Poisoning by other psychotropic drugs, intentional self-harm, initial encounter: Secondary | ICD-10-CM

## 2013-08-05 DIAGNOSIS — F3130 Bipolar disorder, current episode depressed, mild or moderate severity, unspecified: Secondary | ICD-10-CM | POA: Diagnosis present

## 2013-08-05 DIAGNOSIS — T43501A Poisoning by unspecified antipsychotics and neuroleptics, accidental (unintentional), initial encounter: Principal | ICD-10-CM

## 2013-08-05 HISTORY — DX: Anemia, unspecified: D64.9

## 2013-08-05 HISTORY — DX: Poisoning by unspecified drugs, medicaments and biological substances, accidental (unintentional), initial encounter: T50.901A

## 2013-08-05 HISTORY — DX: Impaired fasting glucose: R73.01

## 2013-08-05 LAB — COMPREHENSIVE METABOLIC PANEL
ALT: 9 U/L (ref 0–53)
AST: 8 U/L (ref 0–37)
Albumin: 3.4 g/dL — ABNORMAL LOW (ref 3.5–5.2)
Alkaline Phosphatase: 172 U/L — ABNORMAL HIGH (ref 39–117)
BUN: 8 mg/dL (ref 6–23)
CALCIUM: 9.6 mg/dL (ref 8.4–10.5)
CHLORIDE: 92 meq/L — AB (ref 96–112)
CO2: 22 mEq/L (ref 19–32)
Creatinine, Ser: 0.7 mg/dL (ref 0.50–1.35)
GFR calc Af Amer: >90 mL/min (ref >90)
GFR calc non Af Amer: >90 mL/min (ref >90)
Glucose, Bld: 216 mg/dL — ABNORMAL HIGH (ref 70–99)
Potassium: 3.4 mEq/L — ABNORMAL LOW (ref 3.7–5.3)
Sodium: 134 mEq/L — ABNORMAL LOW (ref 137–147)
Total Bilirubin: 0.4 mg/dL (ref 0.3–1.2)
Total Protein: 7.5 g/dL (ref 6.0–8.3)

## 2013-08-05 LAB — POC OCCULT BLOOD, ED: Fecal Occult Bld: NEGATIVE

## 2013-08-05 LAB — CBC
HCT: 33.4 % — ABNORMAL LOW (ref 39.0–52.0)
Hemoglobin: 10.7 g/dL — ABNORMAL LOW (ref 13.0–17.0)
MCH: 28.8 pg (ref 26.0–34.0)
MCHC: 32 g/dL (ref 30.0–36.0)
MCV: 90 fL (ref 78.0–100.0)
PLATELETS: 310 10*3/uL (ref 150–400)
RBC: 3.71 MIL/uL — AB (ref 4.22–5.81)
RDW: 16.4 % — ABNORMAL HIGH (ref 11.5–15.5)
WBC: 12.8 10*3/uL — AB (ref 4.0–10.5)

## 2013-08-05 LAB — ACETAMINOPHEN LEVEL: Acetaminophen (Tylenol), Serum: <15 ug/mL (ref 10–30)

## 2013-08-05 LAB — ETHANOL: Alcohol, Ethyl (B): <11 mg/dL (ref 0–11)

## 2013-08-05 LAB — SALICYLATE LEVEL

## 2013-08-05 LAB — CBG MONITORING, ED: GLUCOSE-CAPILLARY: 205 mg/dL — AB (ref 70–99)

## 2013-08-05 MED ORDER — SODIUM CHLORIDE 0.9 % IV SOLN: INTRAVENOUS | Status: DC

## 2013-08-05 MED ORDER — SODIUM CHLORIDE 0.9 % IV SOLN
1000.0000 mL | INTRAVENOUS | Status: DC
  Administered 2013-08-05: 1000 mL via INTRAVENOUS

## 2013-08-05 MED ORDER — LORAZEPAM 2 MG/ML IJ SOLN
1.0000 mg | INTRAMUSCULAR | Status: AC | PRN
  Administered 2013-08-05 (×2): 1 mg via INTRAVENOUS
  Filled 2013-08-05 (×2): qty 1

## 2013-08-05 MED ORDER — PANTOPRAZOLE SODIUM 40 MG IV SOLR
40.0000 mg | INTRAVENOUS | Status: DC
  Administered 2013-08-05: 40 mg via INTRAVENOUS
  Filled 2013-08-05 (×2): qty 40

## 2013-08-05 MED ORDER — ONDANSETRON HCL 4 MG PO TABS: 4.0000 mg | ORAL_TABLET | Freq: Four times a day (QID) | ORAL | Status: DC | PRN

## 2013-08-05 MED ORDER — HEPARIN SODIUM (PORCINE) 5000 UNIT/ML IJ SOLN
5000.0000 [IU] | Freq: Three times a day (TID) | INTRAMUSCULAR | Status: DC
  Administered 2013-08-05 – 2013-08-07 (×6): 5000 [IU] via SUBCUTANEOUS
  Filled 2013-08-05 (×8): qty 1

## 2013-08-05 MED ORDER — OXYCODONE HCL 5 MG PO TABS
5.0000 mg | ORAL_TABLET | ORAL | Status: DC | PRN
  Administered 2013-08-05 – 2013-08-06 (×4): 5 mg via ORAL
  Filled 2013-08-05 (×4): qty 1

## 2013-08-05 MED ORDER — SODIUM CHLORIDE 0.9 % IV SOLN
1000.0000 mL | Freq: Once | INTRAVENOUS | Status: AC
  Administered 2013-08-05: 1000 mL via INTRAVENOUS

## 2013-08-05 MED ORDER — ACETAMINOPHEN 650 MG RE SUPP: 650.0000 mg | Freq: Four times a day (QID) | RECTAL | Status: DC | PRN

## 2013-08-05 MED ORDER — ONDANSETRON HCL 4 MG/2ML IJ SOLN: 4.0000 mg | Freq: Four times a day (QID) | INTRAMUSCULAR | Status: DC | PRN

## 2013-08-05 MED ORDER — POTASSIUM CHLORIDE IN NACL 20-0.9 MEQ/L-% IV SOLN
INTRAVENOUS | Status: DC
  Administered 2013-08-05 – 2013-08-06 (×2): via INTRAVENOUS
  Filled 2013-08-05 (×3): qty 1000

## 2013-08-05 MED ORDER — BIOTENE DRY MOUTH MT LIQD
15.0000 mL | Freq: Two times a day (BID) | OROMUCOSAL | Status: DC
  Administered 2013-08-06 – 2013-08-07 (×3): 15 mL via OROMUCOSAL

## 2013-08-05 MED ORDER — ACETAMINOPHEN 325 MG PO TABS: 650.0000 mg | ORAL_TABLET | Freq: Four times a day (QID) | ORAL | Status: DC | PRN

## 2013-08-05 MED ORDER — SODIUM CHLORIDE 0.9 % IJ SOLN: 3.0000 mL | Freq: Two times a day (BID) | INTRAMUSCULAR | Status: DC

## 2013-08-05 MED ORDER — OXYCODONE HCL 5 MG PO TABS
10.0000 mg | ORAL_TABLET | Freq: Once | ORAL | Status: AC
  Administered 2013-08-05: 10 mg via ORAL
  Filled 2013-08-05: qty 2
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
## 2013-08-05 NOTE — ED Notes (Signed)
Pt sleepy with intermittent loud cursing statements.

## 2013-08-05 NOTE — ED Notes (Signed)
Pt alert to self and reports taking a "hand full of Seroquel."

## 2013-08-05 NOTE — ED Notes (Signed)
Bed: [facility] Expected date:  Expected time:  Means of arrival:  Comments: ems- overdose

## 2013-08-05 NOTE — ED Notes (Addendum)
Pt sleepy pt eyes brisk and reactive. Pt hard to awaken. Pt awaken touch and loud voice. Pt states he is "sleepy." Vitals stable.

## 2013-08-05 NOTE — ED Notes (Signed)
[name] [name] reports room with be ready in 10-15 minutes.
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
## 2013-08-05 NOTE — Progress Notes (Signed)
Pt is alert, oriented x 4, calm, and cooperative.  Pt reports taking over 30 tablets of Seroquel because he "did not want to live anymore."  Pt. Reports that he is still having SI but denies HI.  Poison control updated on pt status.  Sitter at bedside.  Will continue to monitor.

## 2013-08-05 NOTE — ED Notes (Signed)
Per pt mother went with pt to PCP today for chronic back pain and made further appointment for injections to treat pain. Per mother pt stated "I cannot live like this." Mother counted Seroquel and reports pt took 27 300 mg tablets.

## 2013-08-05 NOTE — ED Notes (Signed)
Pt states took Seroquel because "having trouble sleeping." Pt denies SI/HI.

## 2013-08-05 NOTE — ED Notes (Signed)
Pt radial and dorsalis pedis pulses strong.
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
## 2013-08-05 NOTE — ED Notes (Addendum)
Attempt to draw blood unsuccessful. Amanda R at bedside attempting blood draw. Pt cursing/aggressive toward staff.

## 2013-08-05 NOTE — ED Notes (Addendum)
Spoke with [employer] staff regarding pt update.

## 2013-08-05 NOTE — ED Notes (Signed)
[facility] OF PT CONTACT NUMBER [id number]

## 2013-08-05 NOTE — ED Notes (Signed)
Bed: BW38 Expected date:  Expected time:  Means of arrival:  Comments: ems- overdose

## 2013-08-05 NOTE — ED Notes (Addendum)
Pt sleepy with intermittent episodes of shouting curse words. Pt alert to self, place, and situation, but not to time. Pt on 2 lpm [place] and breathing easily/unlabored at present time.

## 2013-08-06 ENCOUNTER — Encounter (HOSPITAL_COMMUNITY): Payer: Self-pay | Admitting: Internal Medicine

## 2013-08-06 DIAGNOSIS — D649 Anemia, unspecified: Secondary | ICD-10-CM

## 2013-08-06 DIAGNOSIS — R7301 Impaired fasting glucose: Secondary | ICD-10-CM | POA: Diagnosis present

## 2013-08-06 DIAGNOSIS — J9801 Acute bronchospasm: Secondary | ICD-10-CM | POA: Diagnosis present

## 2013-08-06 DIAGNOSIS — F3130 Bipolar disorder, current episode depressed, mild or moderate severity, unspecified: Secondary | ICD-10-CM

## 2013-08-06 DIAGNOSIS — F17200 Nicotine dependence, unspecified, uncomplicated: Secondary | ICD-10-CM

## 2013-08-06 HISTORY — DX: Impaired fasting glucose: R73.01

## 2013-08-06 LAB — BASIC METABOLIC PANEL
BUN: 6 mg/dL (ref 6–23)
CHLORIDE: 105 meq/L (ref 96–112)
CO2: 23 mEq/L (ref 19–32)
Calcium: 8.8 mg/dL (ref 8.4–10.5)
Creatinine, Ser: 0.71 mg/dL (ref 0.50–1.35)
GFR calc non Af Amer: >90 mL/min (ref >90)
Glucose, Bld: 87 mg/dL (ref 70–99)
Potassium: 3.9 mEq/L (ref 3.7–5.3)
Sodium: 140 mEq/L (ref 137–147)

## 2013-08-06 LAB — RAPID URINE DRUG SCREEN, HOSP PERFORMED
Amphetamines: NOT DETECTED
BENZODIAZEPINES: NOT DETECTED
Barbiturates: NOT DETECTED
Cocaine: NOT DETECTED
Opiates: NOT DETECTED
TETRAHYDROCANNABINOL: NOT DETECTED

## 2013-08-06 LAB — MRSA PCR SCREENING: MRSA by PCR: NEGATIVE

## 2013-08-06 LAB — HEMOGLOBIN A1C
HEMOGLOBIN A1C: 6 % — AB (ref <5.7)
Mean Plasma Glucose: 126 mg/dL — ABNORMAL HIGH (ref <117)

## 2013-08-06 LAB — CBC
HEMATOCRIT: 30.5 % — AB (ref 39.0–52.0)
Hemoglobin: 9.6 g/dL — ABNORMAL LOW (ref 13.0–17.0)
MCH: 28.6 pg (ref 26.0–34.0)
MCHC: 31.5 g/dL (ref 30.0–36.0)
MCV: 90.8 fL (ref 78.0–100.0)
Platelets: 295 10*3/uL (ref 150–400)
RBC: 3.36 MIL/uL — ABNORMAL LOW (ref 4.22–5.81)
RDW: 16.5 % — ABNORMAL HIGH (ref 11.5–15.5)
WBC: 11.6 10*3/uL — AB (ref 4.0–10.5)

## 2013-08-06 MED ORDER — PANTOPRAZOLE SODIUM 40 MG PO TBEC
40.0000 mg | DELAYED_RELEASE_TABLET | Freq: Every day | ORAL | Status: DC
  Administered 2013-08-06: 40 mg via ORAL
  Filled 2013-08-06 (×2): qty 1

## 2013-08-06 MED ORDER — NICOTINE 14 MG/24HR TD PT24
14.0000 mg | MEDICATED_PATCH | Freq: Every day | TRANSDERMAL | Status: DC
  Administered 2013-08-06 – 2013-08-07 (×2): 14 mg via TRANSDERMAL
  Filled 2013-08-06 (×2): qty 1

## 2013-08-06 MED ORDER — GUAIFENESIN-DM 100-10 MG/5ML PO SYRP: 5.0000 mL | ORAL_SOLUTION | ORAL | Status: DC | PRN

## 2013-08-06 MED ORDER — KETOROLAC TROMETHAMINE 30 MG/ML IJ SOLN
30.0000 mg | Freq: Four times a day (QID) | INTRAMUSCULAR | Status: DC | PRN
  Administered 2013-08-06 – 2013-08-07 (×5): 30 mg via INTRAVENOUS
  Filled 2013-08-06: qty 2
  Filled 2013-08-06 (×3): qty 1
  Filled 2013-08-06 (×2): qty 2

## 2013-08-06 MED ORDER — IPRATROPIUM-ALBUTEROL 0.5-2.5 (3) MG/3ML IN SOLN: 3.0000 mL | RESPIRATORY_TRACT | Status: DC | PRN

## 2013-08-06 MED ORDER — FAMOTIDINE 20 MG PO TABS
20.0000 mg | ORAL_TABLET | Freq: Every day | ORAL | Status: DC
  Administered 2013-08-06: 20 mg via ORAL
  Filled 2013-08-06 (×2): qty 1

## 2013-08-06 MED ORDER — OXYCODONE HCL 5 MG PO TABS
10.0000 mg | ORAL_TABLET | ORAL | Status: DC | PRN
  Administered 2013-08-06 (×2): 10 mg via ORAL
  Administered 2013-08-06: 5 mg via ORAL
  Administered 2013-08-06 – 2013-08-07 (×4): 10 mg via ORAL
  Filled 2013-08-06 (×7): qty 2

## 2013-08-06 MED ORDER — POLYETHYLENE GLYCOL 3350 17 G PO PACK
17.0000 g | PACK | Freq: Every day | ORAL | Status: DC
  Administered 2013-08-06 – 2013-08-07 (×2): 17 g via ORAL
  Filled 2013-08-06 (×2): qty 1

## 2013-08-06 NOTE — Progress Notes (Signed)
PROGRESS NOTE   [name]:[id number] DOB: 02-08-1966 DOA: 08/05/2013 PCP: [name], MD  Brief narrative: [name] [name] is an 48 y.o. male with a PMH of bipolar 1 disorder managed with Seroquel who was brought to the hospital via EMS after an intentional OD of seroquel.  Assessment/Plan: Principal Problem:  Drug overdose  CT of the head done on admission, negative for acute findings. Admitted to the step down unit. Poison control was contacted and recommended supportive care. Monitored on telemetry and provided with IV fluids. No alcohol, Tylenol or salicylates on toxicology. Rapid drug screen negative. Continue Air cabin crew. [facility] consult psychiatry.  OK to transfer out of SDU, awake and alert, advance diet and KVO IV fluids. Active Problems:  Back pain Had CT myelogram 03/15/13 which showed no focal disc protrusion or evidence for significant stenosis. He has been referred to a pain specialist by his orthopedic doctor, but has not yet had his first appointment. Will increase oxycodone to 10 mg every 4 hours as needed and add as needed Toradol. Would avoid IV narcotics. Bronchospasm Duonebs ordered every 4 hours as needed. Robitussin when necessary. Tobacco abuse Start nicotine patch. Hypokalemia  Resolved with potassium added to IV fluids.  Hyperglycemia / impaired fasting glucose Hemoglobin A1c 6% corresponding to impaired fasting glucose.  Normocytic anemia  Guaiac stools.  DVT prophylaxis  Subcutaneous heparin.   Code Status: Full. Family Communication: Parents updated 08/05/13.  Not at bedside this morning. Disposition Plan: Home versus inpatient psych unit.   IV access:  Peripheral IV  Medical Consultants:  Psychiatry  Other Consultants:  None  Anti-infectives:  None  HPI/Subjective: [name] [name] complains of significant back pain. Tells me he does not remember taking Seroquel yesterday, but that he took the overdose because he was in  excruciating pain. No bowel movement in several days. No complaints of nausea, vomiting, diarrhea. Some mild shortness of breath and cough.  Objective: Filed Vitals:   08/06/13 0300 08/06/13 0400 08/06/13 0500 08/06/13 0600  BP:    128/77  Pulse: 124 124 125 123  Temp:      TempSrc:      Resp: 24 21 23 20   Height:      Weight:      SpO2: 93% 93% 92% 91%    Intake/Output Summary (Last 24 hours) at 08/06/13 0713 Last data filed at 08/06/13 0600  Gross per 24 hour  Intake 966.67 ml  Output   1300 ml  Net -333.33 ml    Exam: Gen:  NAD Cardiovascular:  RRR, No M/R/G Respiratory:  Lungs with a few wheezes Gastrointestinal:  Abdomen soft, NT/ND, + BS Extremities:  No C/E/C  Data Reviewed: Basic Metabolic Panel:  Recent Labs Lab 08/05/13 1512 08/06/13 0350  NA 134* 140  K 3.4* 3.9  CL 92* 105  CO2 22 23  GLUCOSE 216* 87  BUN 8 6  CREATININE 0.70 0.71  CALCIUM 9.6 8.8   GFR Estimated Creatinine Clearance: 109.3 ml/min (by C-G formula based on Cr of 0.71). Liver Function Tests:  Recent Labs Lab 08/05/13 1512  AST 8  ALT 9  ALKPHOS 172*  BILITOT 0.4  PROT 7.5  ALBUMIN 3.4*   CBC:  Recent Labs Lab 08/05/13 1512 08/06/13 0350  WBC 12.8* 11.6*  HGB 10.7* 9.6*  HCT 33.4* 30.5*  MCV 90.0 90.8  PLT 310 295   CBG:  Recent Labs Lab 08/05/13 1455  GLUCAP 205*   Hgb A1c  Recent Labs  08/05/13 1503  HGBA1C 6.0*   Microbiology Recent Results (from the past 240 hour(s))  MRSA PCR SCREENING     Status: None   Collection Time    08/05/13 10:47 PM      Result Value Ref Range Status   MRSA by PCR NEGATIVE  NEGATIVE Final   Comment:            The GeneXpert MRSA Assay (FDA     approved for NASAL specimens     only), is one component of a     comprehensive MRSA colonization     surveillance program. It is not     intended to diagnose MRSA     infection nor to guide or     monitor treatment for     MRSA infections.     Procedures and  Diagnostic Studies: Ct Head Wo Contrast  08/05/2013   CLINICAL DATA:  Drug overdose  EXAM: CT HEAD WITHOUT CONTRAST  TECHNIQUE: Contiguous axial images were obtained from the base of the skull through the vertex without intravenous contrast.  COMPARISON:  None.  FINDINGS: Motion artifacts are noted. No intracranial hemorrhage, mass effect or midline shift. Mild cerebral atrophy. There is mucosal thickening posterior aspect bilateral sphenoid sinus. The mastoid air cells are unremarkable.  No intracranial hemorrhage, mass effect or midline shift. No definite acute cortical infarction. No mass lesion is noted on this unenhanced scan.  IMPRESSION: Suboptimal exam due to motion artifacts. No intracranial hemorrhage, mass effect or midline shift. No definite acute cortical infarction. Mucosal thickening posterior aspect bilateral sphenoid sinus.   Electronically Signed   By: Lahoma Crocker M.D.   On: 08/05/2013 18:46    Scheduled Meds: . antiseptic oral rinse  15 mL Mouth Rinse BID  . heparin  5,000 Units Subcutaneous 3 times per day  . pantoprazole (PROTONIX) IV  40 mg Intravenous Q24H  . sodium chloride  3 mL Intravenous Q12H   Continuous Infusions: . 0.9 % NaCl with KCl 20 mEq / L 125 mL/hr at 08/06/13 0654    Time spent: 35 minutes with > 50% of time discussing current diagnostic test results, clinical impression and plan of care.    LOS: 1 day   RAMA,CHRISTINA  [facility] Pager [phone]. If unable to reach me by pager, please call my cell phone at [phone].  *Please note that the hospitalists switch teams on Wednesdays. Please call the flow manager at [phone] if you are having difficulty reaching the hospitalist taking care of this patient as she can update you and provide the most up-to-date pager number of provider caring for the patient. If 8PM-8AM, please contact night-coverage at [email or web address], password [facility]  08/06/2013, 7:13 AM    **Disclaimer: This note was dictated with  voice recognition software. Similar sounding words can inadvertently be transcribed and this note may contain transcription errors which may not have been corrected upon publication of note.**

## 2013-08-06 NOTE — Consult Note (Signed)
[facility] Face-to-Face Psychiatry Consult   Reason for Consult:  Seroquel Overdose Referring Physician: Dr. Carmelina Paddock is an 48 y.o. male. Total Time spent with patient: 45 minutes  Assessment: AXIS I:  Bipolar, Depressed AXIS II:  Deferred AXIS III:   Past Medical History  Diagnosis Date  . Depression     bipolar  . Hyperlipidemia   . GERD (gastroesophageal reflux disease)     barrett's  . Bipolar 1 disorder   . Pneumonia   . Impaired fasting glucose 08/06/2013  . Normocytic anemia 08/05/2013  . Drug overdose 08/05/2013   AXIS IV:  economic problems, other psychosocial or environmental problems, problems related to social environment and problems with primary support group AXIS V:  51-60 moderate symptoms  Plan:  No evidence of imminent risk to self or others at present.   Patient does not meet criteria for psychiatric inpatient admission. Supportive therapy provided about ongoing stressors. Discussed crisis plan, support from social network, calling 911, coming to the Emergency Department, and calling Suicide Hotline.  Subjective:   BELL CAI is a 48 y.o. male   History of Present Illness:  Patient was seen and chart reviewed. Patient stated that he has taken his medication seroquel two pills to sleep and it did not help so he took 8 pills and informed to one of his friend to check on him in case. His friend called his family who could not wake him up so he was taken to [facility] prior to sending here. He was a patient of Dr.Ray at [facility] in [place] and repeatedly denied symptoms of depression, mania or psychosis. He has denied suicidal or homicidal ideations, intention or plans.   Medical history: HOLLIS TULLER is an 48 y.o. male with a PMH of bipolar 1 disorder managed with Seroquel who was brought to the hospital via EMS after an intentional OD of seroquel. The history is given by the patient's mother. She tells me that the patient has been having a lot of problems with  back pain and has seen multiple doctors for evaluation of this. He has had injections to his back to relieve the pain and has been on narcotics. He saw an orthopedic doctor today who told him the only thing he could give him was another back injection. He then told his other "I cannot live like this ". He subsequently took approximately 27 tablets of Seroquel XR 400 mg and told a friend about his overdose. His friend then contacted the patient's parents who called EMS. Patient's parents state that he has never attempted suicide in the past. He is under the care of Dr. Malachi Bonds for his bipolar illness. Patient's family denies that he has any history of illicit substance use or alcohol abuse. 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 The patient is unable to provide any history at the moment, he is agitated and disoriented and screaming out incoherently.   Review of Systems:  Negative except feeling tired and sleepy.   Past Psychiatric History: Past Medical History  Diagnosis Date  . Depression     bipolar  . Hyperlipidemia   . GERD (gastroesophageal reflux disease)     barrett's  . Bipolar 1 disorder   . Pneumonia   . Impaired fasting glucose 08/06/2013  . Normocytic anemia 08/05/2013  . Drug overdose 08/05/2013    reports that he has been smoking Cigarettes.  He has been smoking about 0.50 packs per  day. He has never used smokeless tobacco. He reports that he drinks about 14.4 ounces of alcohol per week. He reports that he does not use illicit drugs. Family History  Problem Relation Age of Onset  . Adopted: Yes     Living Arrangements: Alone   Abuse/Neglect [facility]) Physical Abuse: Denies Verbal Abuse: Denies Sexual Abuse: Denies Allergies:   Allergies  Allergen Reactions  . Codeine Shortness Of Breath and Nausea And Vomiting       . Morphine Shortness Of Breath and Nausea And Vomiting         ACT Assessment Complete:  No:   Past Psychiatric  History: Diagnosis:  Bipolar depression  Hospitalizations:  No  Outpatient Care:  YES, [facility] [employer], [place]  Substance Abuse Care:  denied  Self-Mutilation:  No   Suicidal Attempts:  No  Homicidal Behaviors:  No    Violent Behaviors:   NO   Place of Residence: [place] Marital Status:  single Employed/Unemployed:  No Education:  Graduated Family Supports:  yes Objective: Blood pressure 122/72, pulse 113, temperature 98.4 F (36.9 C), temperature source Oral, resp. rate 20, height 5' 8"  (1.727 m), weight 81.7 kg (180 lb 1.9 oz), SpO2 94.00%.Body mass index is 27.39 kg/(m^2). Results for orders placed during the hospital encounter of 08/05/13 (from the past 72 hour(s))  CBG MONITORING, ED     Status: Abnormal   Collection Time    08/05/13  2:55 PM      Result Value Ref Range   Glucose-Capillary 205 (*) 70 - 99 mg/dL  HEMOGLOBIN A1C     Status: Abnormal   Collection Time    08/05/13  3:03 PM      Result Value Ref Range   Hemoglobin A1C 6.0 (*) <5.7 %   Comment: (NOTE)                                                                               According to the ADA Clinical Practice Recommendations for 2011, when     HbA1c is used as a screening test:      >=6.5%   Diagnostic of Diabetes Mellitus               (if abnormal result is confirmed)     5.7-6.4%   Increased risk of developing Diabetes Mellitus     References:Diagnosis and Classification of Diabetes Mellitus,Diabetes     WGYK,5993,57(SVXBL 1):S62-S69 and Standards of Medical Care in             Diabetes - 2011,Diabetes Care,2011,34 (Suppl 1):S11-S61.   Mean Plasma Glucose 126 (*) <117 mg/dL   Comment: Performed at [facility]  CBC     Status: Abnormal   Collection Time    08/05/13  3:12 PM      Result Value Ref Range   WBC 12.8 (*) 4.0 - 10.5 K/uL   RBC 3.71 (*) 4.22 - 5.81 MIL/uL   Hemoglobin 10.7 (*) 13.0 - 17.0 g/dL   HCT 33.4 (*) 39.0 - 52.0 %   MCV 90.0  78.0 - 100.0 fL   MCH 28.8  26.0 -  34.0 pg   MCHC 32.0  30.0 - 36.0 g/dL  RDW 16.4 (*) 11.5 - 15.5 %   Platelets 310  150 - 400 K/uL  COMPREHENSIVE METABOLIC PANEL     Status: Abnormal   Collection Time    08/05/13  3:12 PM      Result Value Ref Range   Sodium 134 (*) 137 - 147 mEq/L   Potassium 3.4 (*) 3.7 - 5.3 mEq/L   Chloride 92 (*) 96 - 112 mEq/L   CO2 22  19 - 32 mEq/L   Glucose, Bld 216 (*) 70 - 99 mg/dL   BUN 8  6 - 23 mg/dL   Creatinine, Ser 0.70  0.50 - 1.35 mg/dL   Calcium 9.6  8.4 - 10.5 mg/dL   Total Protein 7.5  6.0 - 8.3 g/dL   Albumin 3.4 (*) 3.5 - 5.2 g/dL   AST 8  0 - 37 U/L   ALT 9  0 - 53 U/L   Alkaline Phosphatase 172 (*) 39 - 117 U/L   Total Bilirubin 0.4  0.3 - 1.2 mg/dL   GFR calc non Af Amer >90  >90 mL/min   GFR calc Af Amer >90  >90 mL/min   Comment: (NOTE)     The eGFR has been calculated using the CKD EPI equation.     This calculation has not been validated in all clinical situations.     eGFR's persistently <90 mL/min signify possible Chronic Kidney     Disease.  ETHANOL     Status: None   Collection Time    08/05/13  3:12 PM      Result Value Ref Range   Alcohol, Ethyl (B) <11  0 - 11 mg/dL   Comment:            LOWEST DETECTABLE LIMIT FOR     SERUM ALCOHOL IS 11 mg/dL     FOR MEDICAL PURPOSES ONLY  ACETAMINOPHEN LEVEL     Status: None   Collection Time    08/05/13  3:12 PM      Result Value Ref Range   Acetaminophen (Tylenol), Serum <15.0  10 - 30 ug/mL   Comment:            THERAPEUTIC CONCENTRATIONS VARY     SIGNIFICANTLY. A RANGE OF 10-30     ug/mL MAY BE AN EFFECTIVE     CONCENTRATION FOR MANY PATIENTS.     HOWEVER, SOME ARE BEST TREATED     AT CONCENTRATIONS OUTSIDE THIS     RANGE.     ACETAMINOPHEN CONCENTRATIONS     >150 ug/mL AT 4 HOURS AFTER     INGESTION AND >50 ug/mL AT 12     HOURS AFTER INGESTION ARE     OFTEN ASSOCIATED WITH TOXIC     REACTIONS.  SALICYLATE LEVEL     Status: Abnormal   Collection Time    08/05/13  3:12 PM      Result Value Ref  Range   Salicylate Lvl <2.3 (*) 2.8 - 20.0 mg/dL  POC OCCULT BLOOD, ED     Status: None   Collection Time    08/05/13  6:17 PM      Result Value Ref Range   Fecal Occult Bld NEGATIVE  NEGATIVE  MRSA PCR SCREENING     Status: None   Collection Time    08/05/13 10:47 PM      Result Value Ref Range   MRSA by PCR NEGATIVE  NEGATIVE   Comment:  The GeneXpert MRSA Assay (FDA     approved for NASAL specimens     only), is one component of a     comprehensive MRSA colonization     surveillance program. It is not     intended to diagnose MRSA     infection nor to guide or     monitor treatment for     MRSA infections.  URINE RAPID DRUG SCREEN (HOSP PERFORMED)     Status: None   Collection Time    08/05/13 11:26 PM      Result Value Ref Range   Opiates NONE DETECTED  NONE DETECTED   Cocaine NONE DETECTED  NONE DETECTED   Benzodiazepines NONE DETECTED  NONE DETECTED   Amphetamines NONE DETECTED  NONE DETECTED   Tetrahydrocannabinol NONE DETECTED  NONE DETECTED   Barbiturates NONE DETECTED  NONE DETECTED   Comment:            DRUG SCREEN FOR MEDICAL PURPOSES     ONLY.  IF CONFIRMATION IS NEEDED     FOR ANY PURPOSE, NOTIFY LAB     WITHIN 5 DAYS.                LOWEST DETECTABLE LIMITS     FOR URINE DRUG SCREEN     Drug Class       Cutoff (ng/mL)     Amphetamine      1000     Barbiturate      200     Benzodiazepine   865     Tricyclics       784     Opiates          300     Cocaine          300     THC              50  BASIC METABOLIC PANEL     Status: None   Collection Time    08/06/13  3:50 AM      Result Value Ref Range   Sodium 140  137 - 147 mEq/L   Potassium 3.9  3.7 - 5.3 mEq/L   Chloride 105  96 - 112 mEq/L   Comment: DELTA CHECK NOTED     REPEATED TO VERIFY   CO2 23  19 - 32 mEq/L   Glucose, Bld 87  70 - 99 mg/dL   BUN 6  6 - 23 mg/dL   Creatinine, Ser 0.71  0.50 - 1.35 mg/dL   Calcium 8.8  8.4 - 10.5 mg/dL   GFR calc non Af Amer >90  >90 mL/min    GFR calc Af Amer >90  >90 mL/min   Comment: (NOTE)     The eGFR has been calculated using the CKD EPI equation.     This calculation has not been validated in all clinical situations.     eGFR's persistently <90 mL/min signify possible Chronic Kidney     Disease.  CBC     Status: Abnormal   Collection Time    08/06/13  3:50 AM      Result Value Ref Range   WBC 11.6 (*) 4.0 - 10.5 K/uL   RBC 3.36 (*) 4.22 - 5.81 MIL/uL   Hemoglobin 9.6 (*) 13.0 - 17.0 g/dL   HCT 30.5 (*) 39.0 - 52.0 %   MCV 90.8  78.0 - 100.0 fL   MCH 28.6  26.0 - 34.0 pg   MCHC 31.5  30.0 - 36.0 g/dL   RDW 16.5 (*) 11.5 - 15.5 %   Platelets 295  150 - 400 K/uL   Labs are reviewed and are pertinent for.  Current Facility-Administered Medications  Medication Dose Route Frequency Provider Last Rate Last Dose  . 0.9 % NaCl with KCl 20 mEq/ L  infusion   Intravenous Continuous Venetia Maxon Rama, MD 20 mL/hr at 08/06/13 0900    . acetaminophen (TYLENOL) tablet 650 mg  650 mg Oral Q6H PRN Venetia Maxon Rama, MD       Or  . acetaminophen (TYLENOL) suppository 650 mg  650 mg Rectal Q6H PRN Christina P Rama, MD      . antiseptic oral rinse (BIOTENE) solution 15 mL  15 mL Mouth Rinse BID Venetia Maxon Rama, MD   15 mL at 08/06/13 0900  . guaiFENesin-dextromethorphan (ROBITUSSIN DM) 100-10 MG/5ML syrup 5 mL  5 mL Oral Q4H PRN Christina P Rama, MD      . heparin injection 5,000 Units  5,000 Units Subcutaneous 3 times per day Venetia Maxon Rama, MD   5,000 Units at 08/06/13 0600  . ipratropium-albuterol (DUONEB) 0.5-2.5 (3) MG/3ML nebulizer solution 3 mL  3 mL Nebulization Q4H PRN Christina P Rama, MD      . ketorolac (TORADOL) 30 MG/ML injection 30 mg  30 mg Intravenous Q6H PRN Venetia Maxon Rama, MD   30 mg at 08/06/13 0924  . nicotine (NICODERM CQ - dosed in mg/24 hours) patch 14 mg  14 mg Transdermal Daily Venetia Maxon Rama, MD   14 mg at 08/06/13 1028  . ondansetron (ZOFRAN) tablet 4 mg  4 mg Oral Q6H PRN Venetia Maxon Rama, MD        Or  . ondansetron (ZOFRAN) injection 4 mg  4 mg Intravenous Q6H PRN Christina P Rama, MD      . oxyCODONE (Oxy IR/ROXICODONE) immediate release tablet 10 mg  10 mg Oral Q4H PRN Venetia Maxon Rama, MD   5 mg at 08/06/13 0915  . pantoprazole (PROTONIX) injection 40 mg  40 mg Intravenous Q24H Venetia Maxon Rama, MD   40 mg at 08/05/13 1807  . polyethylene glycol (MIRALAX / GLYCOLAX) packet 17 g  17 g Oral Daily Venetia Maxon Rama, MD   17 g at 08/06/13 1028  . sodium chloride 0.9 % injection 3 mL  3 mL Intravenous Q12H Venetia Maxon Rama, MD        Psychiatric Specialty Exam:     Blood pressure 122/72, pulse 113, temperature 98.4 F (36.9 C), temperature source Oral, resp. rate 20, height 5' 8"  (1.727 m), weight 81.7 kg (180 lb 1.9 oz), SpO2 94.00%.Body mass index is 27.39 kg/(m^2).  General Appearance: Casual  Eye Contact::  Good  Speech:  Clear and Coherent  Volume:  Normal  Mood:  Anxious  Affect:  Depressed  Thought Process:  Goal Directed and Intact  Orientation:  Full (Time, Place, and Person)  Thought Content:  WDL  Suicidal Thoughts:  No  Homicidal Thoughts:  No  Memory:  Immediate;   Fair  Judgement:  Intact  Insight:  Fair  Psychomotor Activity:  Decreased  Concentration:  Good  Recall:  Good  Fund of Knowledge:Good  Language: Good  Akathisia:  NA  Handed:  Right  AIMS (if indicated):     Assets:  Communication Skills Desire for Improvement Housing Leisure Time Physical Health Resilience Social Support  Sleep:      Musculoskeletal: Strength & Muscle Tone: within normal limits  Gait & Station: normal Patient leans: N/A  Treatment Plan Summary: Daily contact with patient to assess and evaluate symptoms and progress in treatment Medication management  Doneta Bayman,JANARDHAHA R. 08/06/2013 12:23 PM

## 2013-08-06 NOTE — Progress Notes (Signed)
PHARMACIST - PHYSICIAN COMMUNICATION CONCERNING:  IV to Oral Route Change Policy  RECOMMENDATION: This patient is receiving Protonix by the intravenous route.  Based on criteria approved by the Pharmacy and Therapeutics Committee, Protonix is being converted to the equivalent oral dose form(s).  DESCRIPTION: These criteria include:  Has a documented ability to take oral medications (tolerating diet of full liquids or better or  Gastric tube feedings for >24 hours OR taking other scheduled oral medications for >24 hours).  Expected plan for continued treatment for at least 1 day.  If you have questions about this conversion, please contact the Pharmacy Department  []   [phone] )  [facility] []   [phone] )  [facility]  []   [phone] )  [facility] [x]   [phone] )  [place], PharmD, [place] Pager: [phone] 1:40 PM Pharmacy #: [phone]

## 2013-08-07 DIAGNOSIS — T50901A Poisoning by unspecified drugs, medicaments and biological substances, accidental (unintentional), initial encounter: Secondary | ICD-10-CM

## 2013-08-07 DIAGNOSIS — M549 Dorsalgia, unspecified: Secondary | ICD-10-CM

## 2013-08-07 DIAGNOSIS — G8929 Other chronic pain: Secondary | ICD-10-CM | POA: Diagnosis present

## 2013-08-07 DIAGNOSIS — J9801 Acute bronchospasm: Secondary | ICD-10-CM

## 2013-08-07 LAB — BASIC METABOLIC PANEL
BUN: 10 mg/dL (ref 6–23)
CALCIUM: 8.9 mg/dL (ref 8.4–10.5)
CHLORIDE: 99 meq/L (ref 96–112)
CO2: 27 mEq/L (ref 19–32)
Creatinine, Ser: 0.78 mg/dL (ref 0.50–1.35)
GFR calc non Af Amer: >90 mL/min (ref >90)
Glucose, Bld: 89 mg/dL (ref 70–99)
Potassium: 4.1 mEq/L (ref 3.7–5.3)
Sodium: 139 mEq/L (ref 137–147)

## 2013-08-07 MED ORDER — LIDOCAINE 5 % EX PTCH: 1.0000 | MEDICATED_PATCH | CUTANEOUS | Status: DC

## 2013-08-07 MED ORDER — OXYCODONE-ACETAMINOPHEN 5-325 MG PO TABS: 2.0000 | ORAL_TABLET | Freq: Four times a day (QID) | ORAL | Status: DC | PRN

## 2013-08-07 MED ORDER — POLYETHYLENE GLYCOL 3350 17 G PO PACK: 17.0000 g | PACK | Freq: Every day | ORAL | Status: DC

## 2013-08-07 MED ORDER — ALBUTEROL SULFATE HFA 108 (90 BASE) MCG/ACT IN AERS: 2.0000 | INHALATION_SPRAY | Freq: Four times a day (QID) | RESPIRATORY_TRACT | Status: DC | PRN

## 2013-08-07 MED ORDER — GUAIFENESIN-DM 100-10 MG/5ML PO SYRP: 5.0000 mL | ORAL_SOLUTION | ORAL | Status: DC | PRN

## 2013-08-07 MED ORDER — IBUPROFEN 800 MG PO TABS: 800.0000 mg | ORAL_TABLET | Freq: Three times a day (TID) | ORAL | Status: DC | PRN

## 2013-08-07 MED ORDER — LIDOCAINE 5 % EX PTCH
1.0000 | MEDICATED_PATCH | CUTANEOUS | Status: DC
  Administered 2013-08-07: 1 via TRANSDERMAL
  Filled 2013-08-07: qty 1

## 2013-08-07 MED ORDER — LORAZEPAM 0.5 MG PO TABS
0.5000 mg | ORAL_TABLET | Freq: Once | ORAL | Status: AC
  Administered 2013-08-07: 0.5 mg via ORAL
  Filled 2013-08-07: qty 1

## 2013-08-07 MED ORDER — CYCLOBENZAPRINE HCL 10 MG PO TABS: 10.0000 mg | ORAL_TABLET | Freq: Three times a day (TID) | ORAL | Status: DC | PRN

## 2013-08-07 NOTE — Discharge Instructions (Signed)
Back Pain, Adult Low back pain is very common. About 1 in 5 people have back pain.The cause of low back pain is rarely dangerous. The pain often gets better over time.About half of people with a sudden onset of back pain feel better in just 2 weeks. About 8 in 10 people feel better by 6 weeks.  CAUSES Some common causes of back pain include:  Strain of the muscles or ligaments supporting the spine.  Wear and tear (degeneration) of the spinal discs.  Arthritis.  Direct injury to the back. DIAGNOSIS Most of the time, the direct cause of low back pain is not known.However, back pain can be treated effectively even when the exact cause of the pain is unknown.Answering your caregiver's questions about your overall health and symptoms is one of the most accurate ways to make sure the cause of your pain is not dangerous. If your caregiver needs more information, he or she may order lab work or imaging tests (X-rays or MRIs).However, even if imaging tests show changes in your back, this usually does not require surgery. HOME CARE INSTRUCTIONS For many people, back pain returns.Since low back pain is rarely dangerous, it is often a condition that people can learn to [facility] their own.   Remain active. It is stressful on the back to sit or stand in one place. Do not sit, drive, or stand in one place for more than 30 minutes at a time. Take short walks on level surfaces as soon as pain allows.Try to increase the length of time you walk each day.  Do not stay in bed.Resting more than 1 or 2 days can delay your recovery.  Do not avoid exercise or work.Your body is made to move.It is not dangerous to be active, even though your back may hurt.Your back will likely heal faster if you return to being active before your pain is gone.  Pay attention to your body when you bend and lift. Many people have less discomfortwhen lifting if they bend their knees, keep the load close to their bodies,and  avoid twisting. Often, the most comfortable positions are those that put less stress on your recovering back.  Find a comfortable position to sleep. Use a firm mattress and lie on your side with your knees slightly bent. If you lie on your back, put a pillow under your knees.  Only take over-the-counter or prescription medicines as directed by your caregiver. Over-the-counter medicines to reduce pain and inflammation are often the most helpful.Your caregiver may prescribe muscle relaxant drugs.These medicines help dull your pain so you can more quickly return to your normal activities and healthy exercise.  Put ice on the injured area.  Put ice in a plastic bag.  Place a towel between your skin and the bag.  Leave the ice on for 15-20 minutes, 03-04 times a day for the first 2 to 3 days. After that, ice and heat may be alternated to reduce pain and spasms.  Ask your caregiver about trying back exercises and gentle massage. This may be of some benefit.  Avoid feeling anxious or stressed.Stress increases muscle tension and can worsen back pain.It is important to recognize when you are anxious or stressed and learn ways to manage it.Exercise is a great option. SEEK MEDICAL CARE IF:  You have pain that is not relieved with rest or medicine.  You have pain that does not improve in 1 week.  You have new symptoms.  You are generally not feeling well. SEEK  IMMEDIATE MEDICAL CARE IF:   You have pain that radiates from your back into your legs.  You develop new bowel or bladder control problems.  You have unusual weakness or numbness in your arms or legs.  You develop nausea or vomiting.  You develop abdominal pain.  You feel faint. Document Released: 05/26/2005 Document Revised: 11/25/2011 Document Reviewed: 10/14/2010 [facility] Patient Information 2014 [place], [place].  Overdose, Adult A person can overdose on alcohol, drugs or both by accident or on purpose. If it was on  purpose, it is a serious matter. Professional help should be sought. If the overdose was an accident, certain steps should be taken to make sure that it never happens again. ACCIDENTAL OVERDOSE Overdosing on prescription medications can be a result of:  Not understanding the instructions.  Misreading the label.  Forgetting that you took a dose and then taking another by mistake. This situation happens a lot. To make sure this does not happen again:  Clarify the correct dosage with your caregiver.  Place the correct dosage in a "pill-minder" container (labeled for each day and time of day).  Have someone dispense your medicine. Please be sure to follow-up with your primary care doctor as directed. INTENTIONAL OVERDOSE If the overdose was on purpose, it is a serious situation. Taking more than the prescribed amount of medications (including taking someone else's prescription), abusing street drugs or drinking an amount of alcohol that requires medical treatment can show a variety of possible problems. These may indicate you:  Are depressed or suicidal.  Are abusing drugs, took too much or combined different drugs to experiment with the effects.  Mixed alcohol with drugs and did not realize the danger of doing so (this is drug abuse).  Are suffering addiction to drugs and/or alcohol (also known as chemical dependency).  Binge drink. If you have not been referred to a mental health professional for help, it is important that you get help right away. Only a professional can determine which problems may exist and what the best course of treatment may be. It is your responsibility to follow-up with further evaluation or treatment as directed.  Alcohol is responsible for a large number of overdoses and unintended deaths among college-age young adults. Binge drinking is consuming 4-5 drinks in a short period of time. The amount of alcohol in standard servings of wine (5 oz.), beer (12 oz.) and  distilled spirits (1.5 oz., 80 proof) is the same. Beer or wine can be just as dangerous to the binge drinker as "hard" liquor can be.  CONSEQUENCES OF BINGE DRINKING Alcohol poisoning is the most serious consequence of binge drinking. This is a severe and potentially fatal physical reaction to an alcohol overdose. When too much alcohol is consumed, the brain does not get enough oxygen. The lack of oxygen will eventually cause the brain to shut down the voluntary functions that regulate breathing and heart rate. Symptoms of alcohol poisoning include:  Vomiting.  Passing out (unconsciousness).  Cold, clammy, pale or bluish skin.  Slow or irregular breathing. WHAT SHOULD I DO NEXT? If you have a history of drug abuse or suffer chemical dependency (alcoholism, drug addiction or both), you might consider the following:  Talk with a qualified substance abuse counselor and consider entering a treatment program.  Go to a detox facility if necessary.  If you were attending self-help group meetings, consider returning to them and go often.  Explore other resources located near you (see sources listed below). If you  are unsure if you have a substance abuse problem, ask yourself the following questions:  Have you been told by friends or family that drugs/alcohol has become a problem?  Do you get into fights when drinking or using drugs?  Do you have blackouts (not remembering what you do while using)?  Do you lie about use or amounts of drugs or alcohol you consume?  Do you need chemicals to get you going?  Do you suffer in work or school performance because of drug or alcohol use?  Do you get sick from drug or alcohol use but continue to use anyway?  Do you need drugs or alcohol to relate to people or feel comfortable in social situations?  Do you use drugs or alcohol to forget problems? If you answered "Yes" to any of the above questions, it means you show signs of chemical dependency  and a professional evaluation is suggested. The longer the use of drugs and alcohol continues, the problems will become greater. SEEK IMMEDIATE MEDICAL CARE IF:   You feel like you might repeat your problematic behavior.  You need someone to talk to and feel that it should not wait.  You feel you are a danger to yourself or someone else.  You feel like you are having a new reaction to medications you are taking, or you are getting worse after leaving a care center.  You have an overwhelming urge to drink or use drugs. Addiction cannot be cured, but it can be treated successfully. Treatment centers are listed in the yellow pages under: Cocaine, Narcotics, and Alcoholics Anonymous. Most hospitals and clinics can refer you to a specialized care center. The [place] government maintains a toll-free number for obtaining treatment referrals: 714-025-3542 or 562 638 7688 (TDD) and maintains a website: http://findtreatment.SamedayNews.com.cy. Other websites for additional information are: www.mentalhealth.SamedayNews.com.cy. and VoiceTranslations.de. In [place] treatment resources are listed in each [place] with listings available under [employer] for [employer] or similar titles. Document Released: 05/29/2003 Document Revised: 08/18/2011 Document Reviewed: 04/19/2008 [facility] Patient Information 2014 [place], [place].

## 2013-08-07 NOTE — Progress Notes (Signed)
Pt discharged to home. Discharged at about 1530. DC instructions given with male and male family member at bedside. Prescription also given for pain med and muscle relaxer. No concerns voiced. Pt reminded to stop by pharmacy and pick up meds already prescribed by MD electronically. Pt and family acknowledged doing same. Medicated pt for pain prior to DC. Left unit in wheelchair pushed by nurse tech accompanied by male family member. Left in good condition. Vwilliams,rn.

## 2013-08-07 NOTE — Progress Notes (Signed)
Clinical Social Work Department BRIEF PSYCHOSOCIAL ASSESSMENT 08/07/2013  Patient:  [name] [name], [name] [name]     Account Number:  [id number]     Admit date:  08/05/2013  Clinical Social Worker:  Levie Heritage  Date/Time:  08/07/2013 02:14 PM  Referred by:  Physician  Date Referred:  08/07/2013 Referred for  Behavioral Health Issues   Other Referral:   Interview type:  Patient Other interview type:   Pt's parents at bedside    PSYCHOSOCIAL DATA Living Status:  ALONE Admitted from facility:   Level of care:   Primary support name:  Manolito Jurewicz Primary support relationship to patient:  PARENT Degree of support available:   strong    CURRENT CONCERNS Current Concerns  Behavioral Health Issues   Other Concerns:    SOCIAL WORK ASSESSMENT / PLAN Asked by MD to meet with Pt and family to discuss outpt mental health tx.    Pt and Pt's parents present in the room.    Pt stated that he understands psych MD's recommendation that he follow up with his outpt psychiatrist.  Pt stated that he sees Dr. Kandice Robinsons at the [facility] building.  Pt agreed, too, to request to been seen by a therapist to depression associated with his chronic pain.    Pt's mom stated that Pt is in the beginning stages of becoming a Pt at a pain clinic.  She hopes that being followed by a pain MD will help Pt, not only medically, but emotionally.  Pt and mom are hopeful that the pain MD will take Pt's pain seriously.    No further CSW needs identified.    CSW thanked Pt and his parents for their time.    Pt's mom asked to speak to CSW outside of Pt's room.    Pt's mom emotional about Pt's mental health and told CSW that she intends to check in with Pt on a daily basis, as Pt lives beside her.  She stated that she wants to help Pt but that she also wants him to do for himself.    CSW provided emotional support to Pt's mom.   Assessment/plan status:  No Further Intervention Required Other  assessment/ plan:   Information/referral to community resources:   n/a    PATIENT'S/FAMILY'S RESPONSE TO PLAN OF CARE: Pt was calm, cooperative and pleasant, although is was apparent that Pt was in pain.    Pt stated that he was ok to d/c but that he is having trouble coping with his chronic pain.  He did agree to be seen by a pain MD and his mom is working on coordinating that.    Pt thanked CSW for time and assistance.   Bernita Raisin, [place] [facility] [phone]

## 2013-08-07 NOTE — Discharge Summary (Signed)
Physician Discharge Summary  [name] [name]:[id number] DOB: Dec 14, 1965 DOA: 08/05/2013  PCP: [name], MD  Admit date: 08/05/2013 Discharge date: 08/07/2013  Recommendations for Outpatient Follow-up:  1. Recommend F/U at a pain clinic for ongoing evaluation of chronic back pain. 2. Recommend F/U with psychiatrist for re-evaluation of bipolar I disorder.  No refills provided on Seroquel at discharge. 3. Note: The patient was D/C on a weekend, and was given a prescription for a limited supply of oxycodone at discharge (#15 tablets) to cover his needs until his PCP office opens 08/08/13. 4. Consider outpatient evaluation of anemia.  Discharge Diagnoses:  Principal Problem:    Drug overdose (Seroquel) Active Problems:    Current smoker    Hypokalemia    Hyperglycemia    Normocytic anemia    Impaired fasting glucose    Bronchospasm    Chronic back pain  Discharge Condition: Stable.  Diet recommendation: Regular.  History of present illness:  [name] [name] is an 48 y.o. male with a PMH of bipolar 1 disorder managed with Seroquel who was brought to the hospital via EMS after an intentional OD of seroquel.  Hospital Course by problem:  Principal Problem:  Drug overdose  CT of the head done on admission, negative for acute findings. Poison control was contacted and recommended supportive care. Monitored on telemetry and provided with IV fluids. No alcohol, Tylenol or salicylates on toxicology. Rapid drug screen negative. Safety sitter provided.  Seen by psychiatrist 08/06/13 who recommended outpatient follow up.  Seen by SW prior to D/C and provided with F/U information.  Active Problems:  Back pain  Had CT myelogram 03/15/13 which showed no focal disc protrusion or evidence for significant stenosis. He has been referred to a pain specialist by his orthopedic doctor, but has not yet had his first appointment. Treated with oxycodone 10 mg every 4 hours as needed and Toradol. No  IV narcotics were given. Will D/C home on lidocaine patches, and a limited supply of oxycodone (#15) to cover his needs until his PCP/orthopedic MD office opens. Bronchospasm  Duonebs ordered every 4 hours as needed. Robitussin when necessary. Will send home with Albuterol inhaler. Tobacco abuse  Provided with nicotine patch.  Hypokalemia  Resolved with potassium added to IV fluids.  Hyperglycemia / impaired fasting glucose  Hemoglobin A1c 6% corresponding to impaired fasting glucose.  Normocytic anemia  Outpatient evaluation recommended.   Procedures:  None.  Consultations:  Durward Parcel, MD (Psychiatry)   Discharge Exam: Filed Vitals:   08/07/13 0608  BP: 110/64  Pulse: 103  Temp: 98.6 F (37 C)  Resp: 20   Filed Vitals:   08/06/13 1058 08/06/13 1446 08/06/13 2133 08/07/13 0608  BP: 122/72 118/72 139/80 110/64  Pulse: 113 117 113 103  Temp: 98.4 F (36.9 C) 98.2 F (36.8 C) 99.6 F (37.6 C) 98.6 F (37 C)  TempSrc: Oral Oral Oral Oral  Resp: 20 20 20 20   Height:      Weight:      SpO2: 94% 95% 94% 93%    Gen:  NAD Cardiovascular:  RRR, No M/R/G Respiratory: Lungs CTAB Gastrointestinal: Abdomen soft, NT/ND with normal active bowel sounds. Extremities: No C/E/C   Discharge Instructions      Discharge Orders   Future Appointments Provider Department Dept Phone   08/09/2013 3:00 PM [name] Castle, MD [facility] [phone]   Future Orders Complete By Expires   Call MD for:  severe uncontrolled pain  As directed    Diet general  As directed    Discharge instructions  As directed    Comments:     We recommend you follow up at a pain clinic for ongoing evaluation and treatment of your chronic back pain.    You take pain medications chronically, and must return to your prescribing provider to get refills on these medications.  For your safety, and to provide you with the highest standard of care, [facility] has  a policy that no refills on chronic pain medications will be provided (unless it is after hours or on a weekend, in which case you will be provided with a prescription for a limited amount of medication to cover your needs until your doctor's office re-opens), as it is imperative that you receive your refills from the doctor who prescribes these medications to you.  Prescriptions from multiple providers can compromise your safety and contribute to problems with increased tolerance, addiction or unintentional overdosages.  Please contact your PCP office upon discharge if you need refills on your pain medications.  If you are out of your pain medications, contact your usual prescriber as soon as possible so that you can get your medicine refilled.  Make sure you call during regular office hours, as most prescribers do not write prescriptions for pain medicines after hours or on weekends.  We will NOT prescribe chronic pain medications to anyone who is not under the care of a PCP, as we feel these medications must be monitored closely for your safety.  You were cared for by Dr. Jacquelynn Cree  (a hospitalist) during your hospital stay. If you have any questions about your discharge medications or the care you received while you were in the hospital after you are discharged, you can call the unit and ask to speak with the hospitalist on call if the hospitalist that took care of you is not available. Once you are discharged, your primary care physician will handle any further medical issues. Please note that NO REFILLS for any discharge medications will be authorized once you are discharged, as it is imperative that you return to your primary care physician (or establish a relationship with a primary care physician if you do not have one) for your aftercare needs so that they can reassess your need for medications and monitor your lab values.  Any outstanding tests can be reviewed by your PCP at your follow up visit.   It is also important to review any medicine changes with your PCP.  Please bring these d/c instructions with you to your next visit so your physician can review these changes with you.  If you do not have a primary care physician, you can call [phone] for a physician referral.  It is highly recommended that you obtain a PCP for hospital follow up.   Increase activity slowly  As directed        Medication List    STOP taking these medications       HYDROcodone-acetaminophen 5-325 MG per tablet  Commonly known as:  NORCO/VICODIN     traMADol 50 MG tablet  Commonly known as:  ULTRAM      TAKE these medications       albuterol 108 (90 BASE) MCG/ACT inhaler  Commonly known as:  PROVENTIL HFA;VENTOLIN HFA  Inhale 2 puffs into the lungs every 6 (six) hours as needed for wheezing or shortness of breath.     cyclobenzaprine 10 MG tablet  Commonly known as:  FLEXERIL  Take 1 tablet (10 mg total) by mouth 3 (three) times daily as needed for muscle spasms.     dexlansoprazole 60 MG capsule  Commonly known as:  DEXILANT  Take 1 capsule (60 mg total) by mouth daily.     gabapentin 100 MG capsule  Commonly known as:  NEURONTIN  Take 100 mg by mouth every 8 (eight) hours as needed (pain).     guaiFENesin-dextromethorphan 100-10 MG/5ML syrup  Commonly known as:  ROBITUSSIN DM  Take 5 mLs by mouth every 4 (four) hours as needed for cough.     ibuprofen 800 MG tablet  Commonly known as:  ADVIL,MOTRIN  Take 1 tablet (800 mg total) by mouth every 8 (eight) hours as needed for moderate pain.     lidocaine 5 %  Commonly known as:  LIDODERM  Place 1 patch onto the skin daily. Remove & Discard patch within 12 hours or as directed by MD     oxyCODONE-acetaminophen 5-325 MG per tablet  Commonly known as:  PERCOCET/ROXICET  Take 2 tablets by mouth every 6 (six) hours as needed for severe pain.     polyethylene glycol packet  Commonly known as:  MIRALAX / GLYCOLAX  Take 17 g by mouth daily.      QUEtiapine 300 MG tablet  Commonly known as:  SEROQUEL  Take 600 mg by mouth at bedtime.          The results of significant diagnostics from this hospitalization (including imaging, microbiology, ancillary and laboratory) are listed below for reference.    Significant Diagnostic Studies: Ct Head Wo Contrast  08/05/2013   CLINICAL DATA:  Drug overdose  EXAM: CT HEAD WITHOUT CONTRAST  TECHNIQUE: Contiguous axial images were obtained from the base of the skull through the vertex without intravenous contrast.  COMPARISON:  None.  FINDINGS: Motion artifacts are noted. No intracranial hemorrhage, mass effect or midline shift. Mild cerebral atrophy. There is mucosal thickening posterior aspect bilateral sphenoid sinus. The mastoid air cells are unremarkable.  No intracranial hemorrhage, mass effect or midline shift. No definite acute cortical infarction. No mass lesion is noted on this unenhanced scan.  IMPRESSION: Suboptimal exam due to motion artifacts. No intracranial hemorrhage, mass effect or midline shift. No definite acute cortical infarction. Mucosal thickening posterior aspect bilateral sphenoid sinus.   Electronically Signed   By: Lahoma Crocker M.D.   On: 08/05/2013 18:46    Labs:  Basic Metabolic Panel:  Recent Labs Lab 08/05/13 1512 08/06/13 0350 08/07/13 0608  NA 134* 140 139  K 3.4* 3.9 4.1  CL 92* 105 99  CO2 22 23 27   GLUCOSE 216* 87 89  BUN 8 6 10   CREATININE 0.70 0.71 0.78  CALCIUM 9.6 8.8 8.9   GFR Estimated Creatinine Clearance: 109.3 ml/min (by C-G formula based on Cr of 0.78). Liver Function Tests:  Recent Labs Lab 08/05/13 1512  AST 8  ALT 9  ALKPHOS 172*  BILITOT 0.4  PROT 7.5  ALBUMIN 3.4*   CBC:  Recent Labs Lab 08/05/13 1512 08/06/13 0350  WBC 12.8* 11.6*  HGB 10.7* 9.6*  HCT 33.4* 30.5*  MCV 90.0 90.8  PLT 310 295   CBG:  Recent Labs Lab 08/05/13 1455  GLUCAP 205*   Hgb A1c  Recent Labs  08/05/13 1503  HGBA1C 6.0*    Microbiology Recent Results (from the past 240 hour(s))  MRSA PCR SCREENING     Status: None   Collection Time    08/05/13  10:47 PM      Result Value Ref Range Status   MRSA by PCR NEGATIVE  NEGATIVE Final   Comment:            The GeneXpert MRSA Assay (FDA     approved for NASAL specimens     only), is one component of a     comprehensive MRSA colonization     surveillance program. It is not     intended to diagnose MRSA     infection nor to guide or     monitor treatment for     MRSA infections.    Time coordinating discharge: 35 minutes.  Signed:  Dilpreet Faires  Pager [phone] [facility] 08/07/2013, 2:06 PM

## 2013-08-08 ENCOUNTER — Ambulatory Visit (INDEPENDENT_AMBULATORY_CARE_PROVIDER_SITE_OTHER): Payer: Medicare Other | Admitting: Internal Medicine

## 2013-08-08 ENCOUNTER — Telehealth: Payer: Self-pay | Admitting: *Deleted

## 2013-08-08 ENCOUNTER — Other Ambulatory Visit: Payer: Self-pay | Admitting: Internal Medicine

## 2013-08-08 ENCOUNTER — Ambulatory Visit (INDEPENDENT_AMBULATORY_CARE_PROVIDER_SITE_OTHER)
Admission: RE | Admit: 2013-08-08 | Discharge: 2013-08-08 | Disposition: A | Discharge status: Home / Self Care | Payer: Medicare Other | Source: Ambulatory Visit | Attending: Internal Medicine | Admitting: Internal Medicine

## 2013-08-08 VITALS — BP 110/70 | HR 111 | Temp 96.7°F | Wt 209.1 lb

## 2013-08-08 DIAGNOSIS — R918 Other nonspecific abnormal finding of lung field: Secondary | ICD-10-CM

## 2013-08-08 DIAGNOSIS — R9389 Abnormal findings on diagnostic imaging of other specified body structures: Secondary | ICD-10-CM

## 2013-08-08 DIAGNOSIS — F17200 Nicotine dependence, unspecified, uncomplicated: Secondary | ICD-10-CM

## 2013-08-08 DIAGNOSIS — M549 Dorsalgia, unspecified: Secondary | ICD-10-CM

## 2013-08-08 DIAGNOSIS — F319 Bipolar disorder, unspecified: Secondary | ICD-10-CM

## 2013-08-08 DIAGNOSIS — G8929 Other chronic pain: Secondary | ICD-10-CM

## 2013-08-08 NOTE — Patient Instructions (Signed)
Your next office appointment will be determined based upon review of your pending  x-rays. Those instructions will be transmitted to you through My Chart . Followup as needed for your acute issue. Please report any significant change in your symptoms. Please think about quitting smoking. Review the risks we discussed. Please call 1-800-QUIT-NOW [phone]) for free smoking cessation counseling.

## 2013-08-08 NOTE — Progress Notes (Signed)
   Subjective:    Patient ID: [name] [name], male    DOB: [birth year], 48 y.o.   MRN: [id number]  HPI  The [facility] records & the orthopedic records from Dr. Mina Marble were reviewed. Summaries were made n the problem list under the specific diagnoses  His most recent hospitalization records related to his Seroquel overdose were also reviewed. He does have an appointment to see Dr. Jeanell Sparrow at the [facility] mental health clinic 3-3/15.  To date there's been no followup of the right middle lobe changes. At this time he is smoking one half pack per day.    Review of Systems   He does describe chills and sweats. He also has cough and sputum production. There is no hemoptysis  He continues to have low back pain and has requested referral to a neurosurgeon.    Objective:   Physical Exam General appearance is one of adequate nourishment w/o distress.Disheveled.   Eyes: No conjunctival inflammation or scleral icterus is present.  Oral exam: Dental hygiene is good; lips and gums are healthy appearing.There is no oropharyngeal erythema or exudate noted.   Heart:  Normal rate and regular rhythm. S1 and S2 normal without gallop, murmur, click, rub or other extra sounds     Lungs:Chest clear to auscultation; no wheezes, rhonchi,rales ,or rubs present.No increased work of breathing. Surprisingly clear  Abdomen: bowel sounds normal, soft and non-tender without masses, organomegaly or hernias noted.  No guarding or rebound . No tenderness over the flanks to percussion  Musculoskeletal: Able to lie flat and sit up without help but using low back crawl technique. Negative straight leg raising bilaterally.   Skin:Warm & dry.  Intact without suspicious lesions or rashes ; no jaundice or tenting  Lymphatic: No lymphadenopathy is noted about the head, neck, axilla,   Psych: Pacing rhythm; clinically some agitation & frustration suggested               Assessment & Plan:  See Current  Assessment & Plan in Problem List under specific Diagnosis

## 2013-08-08 NOTE — Telephone Encounter (Signed)
[facility] phoned with the following chest xray results:    Persistent RML density & increased density in the region of the lingula; also nodular prominence in the right peritracheal superior mediastinal region    Further eval of these findings with contrast chest CT recommended.  CB# [phone]

## 2013-08-08 NOTE — Assessment & Plan Note (Addendum)
NS referral attempt as per patient request; the Orthopedist stated surgery not indicated & recommended injections or chronic pain clinic

## 2013-08-08 NOTE — Assessment & Plan Note (Signed)
Risk discussed

## 2013-08-08 NOTE — Progress Notes (Signed)
Pre visit review using our clinic review tool, if applicable. No additional management support is needed unless otherwise documented below in the visit note. 

## 2013-08-09 ENCOUNTER — Ambulatory Visit (INDEPENDENT_AMBULATORY_CARE_PROVIDER_SITE_OTHER): Payer: Medicare Other | Admitting: Gastroenterology

## 2013-08-09 ENCOUNTER — Encounter: Payer: Self-pay | Admitting: Gastroenterology

## 2013-08-09 ENCOUNTER — Other Ambulatory Visit (INDEPENDENT_AMBULATORY_CARE_PROVIDER_SITE_OTHER): Payer: Medicare Other

## 2013-08-09 ENCOUNTER — Encounter: Payer: Self-pay | Admitting: Internal Medicine

## 2013-08-09 VITALS — BP 122/68 | HR 104 | Ht 68.0 in | Wt 210.4 lb

## 2013-08-09 DIAGNOSIS — D649 Anemia, unspecified: Secondary | ICD-10-CM

## 2013-08-09 DIAGNOSIS — K2270 Barrett's esophagus without dysplasia: Secondary | ICD-10-CM

## 2013-08-09 DIAGNOSIS — R6889 Other general symptoms and signs: Secondary | ICD-10-CM

## 2013-08-09 LAB — IBC PANEL
IRON: 27 ug/dL — AB (ref 42–165)
Saturation Ratios: 9.2 % — ABNORMAL LOW (ref 20.0–50.0)
Transferrin: 210.1 mg/dL — ABNORMAL LOW (ref 212.0–360.0)

## 2013-08-09 LAB — FOLATE: Folate: 3 ng/mL — ABNORMAL LOW (ref >5.9)

## 2013-08-09 LAB — VITAMIN B12: Vitamin B-12: 345 pg/mL (ref 211–911)

## 2013-08-09 LAB — FERRITIN: Ferritin: 172.1 ng/mL (ref 22.0–322.0)

## 2013-08-09 NOTE — Patient Instructions (Signed)
Go to the basement for labs  You have been scheduled for an endoscopy with propofol. Please follow written instructions given to you at your visit today. If you use inhalers (even only as needed), please bring them with you on the day of your procedure. Your physician has requested that you go to www.startemmi.com and enter the access code given to you at your visit today. This web site gives a general overview about your procedure. However, you should still follow specific instructions given to you by our office regarding your preparation for the procedure.

## 2013-08-09 NOTE — Assessment & Plan Note (Signed)
Repeat CXray; CT if indicated
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
## 2013-08-09 NOTE — Assessment & Plan Note (Signed)
Anemia is probably multifactorial.  Chronic GI blood loss should be ruled out.   Recommendations #1 check iron, TIBC, ferritin, B12 and folate levels #2 stool Hemoccults

## 2013-08-09 NOTE — Assessment & Plan Note (Signed)
Plan followup endoscopy 

## 2013-08-11 ENCOUNTER — Ambulatory Visit (INDEPENDENT_AMBULATORY_CARE_PROVIDER_SITE_OTHER)
Admission: RE | Admit: 2013-08-11 | Discharge: 2013-08-11 | Disposition: A | Discharge status: Home / Self Care | Payer: Medicare Other | Source: Ambulatory Visit | Attending: Internal Medicine | Admitting: Internal Medicine

## 2013-08-11 DIAGNOSIS — R918 Other nonspecific abnormal finding of lung field: Secondary | ICD-10-CM

## 2013-08-11 DIAGNOSIS — R9389 Abnormal findings on diagnostic imaging of other specified body structures: Secondary | ICD-10-CM

## 2013-08-11 MED ORDER — IOHEXOL 300 MG/ML  SOLN
80.0000 mL | Freq: Once | INTRAMUSCULAR | Status: AC | PRN
  Administered 2013-08-11: 80 mL via INTRAVENOUS

## 2013-08-12 ENCOUNTER — Encounter: Payer: Self-pay | Admitting: Internal Medicine

## 2013-08-12 ENCOUNTER — Ambulatory Visit (INDEPENDENT_AMBULATORY_CARE_PROVIDER_SITE_OTHER): Payer: Medicare Other | Admitting: Internal Medicine

## 2013-08-12 VITALS — BP 122/80 | HR 98 | Temp 97.4°F | Wt 209.4 lb

## 2013-08-12 DIAGNOSIS — C7952 Secondary malignant neoplasm of bone marrow: Secondary | ICD-10-CM

## 2013-08-12 DIAGNOSIS — C7951 Secondary malignant neoplasm of bone: Secondary | ICD-10-CM

## 2013-08-12 DIAGNOSIS — D491 Neoplasm of unspecified behavior of respiratory system: Secondary | ICD-10-CM

## 2013-08-12 MED ORDER — FENTANYL 25 MCG/HR TD PT72
25.0000 ug | MEDICATED_PATCH | TRANSDERMAL | Status: DC
Start: 2013-08-12 — End: 2013-08-22

## 2013-08-12 NOTE — Patient Instructions (Signed)
The  Pulmonary Critical referral will be scheduled and you'll be notified of the time.

## 2013-08-12 NOTE — Progress Notes (Signed)
   Subjective:    Patient ID: [name] [name], male    DOB: 1966-02-04, 48 y.o.   MRN: [id number]  HPI  Otoniel is here with his mother and father to discuss the CT chest scan findings. 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  We reviewed his serial chest x-rays dating back to November 2014. Initially the x-rays were interpreted as atelectasis; this was mainly in the right lower lung area but to some extent on the left when he had acute abdominal distention in the context of narcotic pain medications.  Only the report, not the film in January performed at [facility] in urgent care was  available. Because of persistent right middle lobe lesion followup was recommended here. His focus at that time was on the intractable back pain for which he had seen orthopedics.  A followup x-ray did indeed suggest a right middle lobe mass prompting a CT scan.   I explained the significance of these radiographic findings. His parents asked additional questions. His only concern was his low back pain which had intermittent radicular characteristics and his inability to sleep related to the pain  I explained the there was no indication for him to see Dr. Carloyn Manner, neurosurgeon as his back pain may be related to metastatic disease. 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 Surgical intervention is not an option.  I recommended pulmonary critical care consultation as an initial step as possibly bronchoscopy for tissue diagnosis could be pursued     Review of Systems  He does describe some chest wall pain on the left due to prior fall.  He is not having significant pulmonary symptoms at this time.  Extensive orthopedic evaluation had not suggested indication for any operative process. As he has had no response  to steroid injections; he had been referred to the chronic pain clinic. He states the tramadol has been eventually no benefit for the back pain. Codeine and morphine are reported, shortness of breath, nausea, and vomiting.     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 PET scan can be considered to assess this possibility  Plan: Pulmonary critical care referral.  Fentanyl patch will be initiated and titrated as needed

## 2013-08-12 NOTE — Progress Notes (Signed)
Pre visit review using our clinic review tool, if applicable. No additional management support is needed unless otherwise documented below in the visit note. 

## 2013-08-15 ENCOUNTER — Encounter: Payer: Self-pay | Admitting: Pulmonary Disease

## 2013-08-15 ENCOUNTER — Other Ambulatory Visit (INDEPENDENT_AMBULATORY_CARE_PROVIDER_SITE_OTHER): Payer: Medicare Other

## 2013-08-15 ENCOUNTER — Encounter: Payer: Medicare Other | Admitting: Gastroenterology

## 2013-08-15 ENCOUNTER — Ambulatory Visit (INDEPENDENT_AMBULATORY_CARE_PROVIDER_SITE_OTHER): Payer: Medicare Other | Admitting: Pulmonary Disease

## 2013-08-15 VITALS — BP 114/72 | HR 114 | Ht 69.0 in | Wt 205.0 lb

## 2013-08-15 DIAGNOSIS — C7949 Secondary malignant neoplasm of other parts of nervous system: Secondary | ICD-10-CM | POA: Insufficient documentation

## 2013-08-15 DIAGNOSIS — D499 Neoplasm of unspecified behavior of unspecified site: Secondary | ICD-10-CM

## 2013-08-15 DIAGNOSIS — C7952 Secondary malignant neoplasm of bone marrow: Secondary | ICD-10-CM

## 2013-08-15 DIAGNOSIS — M549 Dorsalgia, unspecified: Secondary | ICD-10-CM

## 2013-08-15 DIAGNOSIS — R222 Localized swelling, mass and lump, trunk: Secondary | ICD-10-CM

## 2013-08-15 DIAGNOSIS — F17200 Nicotine dependence, unspecified, uncomplicated: Secondary | ICD-10-CM

## 2013-08-15 DIAGNOSIS — G8929 Other chronic pain: Secondary | ICD-10-CM

## 2013-08-15 DIAGNOSIS — C7951 Secondary malignant neoplasm of bone: Secondary | ICD-10-CM

## 2013-08-15 DIAGNOSIS — R918 Other nonspecific abnormal finding of lung field: Secondary | ICD-10-CM | POA: Insufficient documentation

## 2013-08-15 DIAGNOSIS — C801 Malignant (primary) neoplasm, unspecified: Secondary | ICD-10-CM

## 2013-08-15 LAB — PROTIME-INR
INR: 1.2 ratio — AB (ref 0.8–1.0)
Prothrombin Time: 12.5 s — ABNORMAL HIGH (ref 10.2–12.4)

## 2013-08-15 LAB — CBC
HCT: 38.2 % — ABNORMAL LOW (ref 39.0–52.0)
Hemoglobin: 12.3 g/dL — ABNORMAL LOW (ref 13.0–17.0)
MCHC: 32.2 g/dL (ref 30.0–36.0)
MCV: 88.8 fl (ref 78.0–100.0)
Platelets: 406 10*3/uL — ABNORMAL HIGH (ref 150.0–400.0)
RBC: 4.3 Mil/uL (ref 4.22–5.81)
RDW: 17.3 % — ABNORMAL HIGH (ref 11.5–14.6)

## 2013-08-15 MED ORDER — PREDNISONE 10 MG PO TABS
30.0000 mg | ORAL_TABLET | Freq: Every day | ORAL | Status: DC
Start: 2013-08-15 — End: 2013-08-29

## 2013-08-15 NOTE — Progress Notes (Signed)
Subjective:    Patient ID: [name] [name], male    DOB: 1965/10/07, 48 y.o.   MRN: [id number]  HPI  [name] [name] is here to see me today for an abnormal CT scan which showed a lung mass.    Several weeks ago he was diagnosed with pneumonia (in January) which lead to a CT scan ordered last week showing the mass.  He was initially seen by an urgent care facility and then referred back to his PCP.  He has been experiencing a dull ache in his chest since January.  Prior to that he had no respiratory problem aside from a rare cough.  He did produce some mucus occasionally but would have no shortness of breath or chest pain his primary problem right now is chest pain. He continues to have some cough with occasional mucus production but this is outweighed by the pain. He has a little bit of shortness of breath. He continues to smoke.Marland Kitchen  He has also had 6 months of back pain and was followed by [facility] for the last few months.  The pain is primarily in his lumbar spine. It has progressed severely in the last several months. He was recently started on a fentanyl patch which has not helped. He was actually supposed to see a chronic pain specialist today but he walked out when they told him that the wait was going to be 3-4 hours.  Past Medical History  Diagnosis Date  . Depression     bipolar  . Hyperlipidemia   . GERD (gastroesophageal reflux disease)     barrett's  . Bipolar 1 disorder   . Pneumonia   . Impaired fasting glucose 08/06/2013  . Normocytic anemia 08/05/2013  . Drug overdose 08/05/2013     Family History  Problem Relation Age of Onset  . Adopted: Yes     History   Social History  . Marital Status: Divorced    Spouse Name: N/A    Number of Children: 0  . Years of Education: N/A   Occupational History  . disabled    Social History Main Topics  . Smoking status: Current Every Day Smoker -- 0.50 packs/day for 30 years    Types: Cigarettes  . Smokeless tobacco:  Never Used     Comment: wants to quit  . Alcohol Use: No  . Drug Use: No  . Sexual Activity: Not on file   Other Topics Concern  . Not on file   Social History Narrative   The patient is disabled. He worked as a Development worker, community in the past. His marriage was an old and he has no children. He lives alone, close to his parents.     Allergies  Allergen Reactions  . Codeine Shortness Of Breath and Nausea And Vomiting       . Morphine Shortness Of Breath and Nausea And Vomiting          Outpatient Prescriptions Prior to Visit  Medication Sig Dispense Refill  . albuterol (PROVENTIL HFA;VENTOLIN HFA) 108 (90 BASE) MCG/ACT inhaler Inhale 2 puffs into the lungs every 6 (six) hours as needed for wheezing or shortness of breath.  1 Inhaler  2  . cyclobenzaprine (FLEXERIL) 10 MG tablet Take 1 tablet (10 mg total) by mouth 3 (three) times daily as needed for muscle spasms.  30 tablet  0  . dexlansoprazole (DEXILANT) 60 MG capsule Take 1 capsule (60 mg total) by mouth daily.  30 capsule  11  .  fentaNYL (DURAGESIC) 25 MCG/HR patch Place 1 patch (25 mcg total) onto the skin every 3 (three) days.  5 patch  0  . ibuprofen (ADVIL,MOTRIN) 800 MG tablet Take 1 tablet (800 mg total) by mouth every 8 (eight) hours as needed for moderate pain.  30 tablet  0  . QUEtiapine (SEROQUEL) 300 MG tablet Take 300 mg by mouth at bedtime.       . traMADol (ULTRAM-ER) 100 MG 24 hr tablet Take 100 mg by mouth 3 (three) times daily as needed for pain.      . polyethylene glycol (MIRALAX / GLYCOLAX) packet Take 17 g by mouth daily.  14 each  0  . guaiFENesin-dextromethorphan (ROBITUSSIN DM) 100-10 MG/5ML syrup Take 5 mLs by mouth every 4 (four) hours as needed for cough.  118 mL  0  . oxyCODONE-acetaminophen (PERCOCET/ROXICET) 5-325 MG per tablet Take 2 tablets by mouth every 6 (six) hours as needed for severe pain.  15 tablet  0   No facility-administered medications prior to visit.      Review of Systems   Constitutional: Negative for fever and unexpected weight change.  HENT: Negative for congestion, dental problem, ear pain, nosebleeds, postnasal drip, rhinorrhea, sinus pressure, sneezing, sore throat and trouble swallowing.   Eyes: Negative for redness and itching.  Respiratory: Positive for cough, chest tightness and shortness of breath. Negative for wheezing.   Cardiovascular: Negative for palpitations and leg swelling.  Gastrointestinal: Negative for nausea and vomiting.  Genitourinary: Negative for dysuria.  Musculoskeletal: Negative for joint swelling.  Skin: Negative for rash.  Neurological: Negative for headaches.  Hematological: Does not bruise/bleed easily.  Psychiatric/Behavioral: Negative for dysphoric mood. The patient is not nervous/anxious.        Objective:   Physical Exam Filed Vitals:   08/15/13 1406  BP: 114/72  Pulse: 114  Height: 5\' 9"  (1.753 m)  Weight: 205 lb (92.987 kg)  SpO2: 96%   Gen: well appearing, mild pain HEENT: NCAT, PERRL, EOMi, OP clear, neck supple without masses PULM: Few wheezes bilaterally CV: RRR, no mgr, no JVD AB: BS+, soft, nontender, no hsm Ext: warm, no edema, no clubbing, no cyanosis Derm: no rash or skin breakdown Neuro: A&Ox4, CN II-XII intact, strength 5/5 in all 4 extremities      Assessment & Plan:   Lung mass Mr. [name] [name] has a right middle lobe mass with associated hilar and mediastinal lymphadenopathy which is all very worrisome for bronchogenic lung cancer considering his smoking history. I have explained to the patient and his mother today that I feel like this likely represents metastatic lung cancer.  The most concerning finding is the metastatic lesion on his T4 vertebral body. He does not have weakness on physical exam today nor does he have loss of bowel or bladder function to suggest cord compression. However, we need to get a biopsy of this as soon as possible and to start treatment as soon as possible because of  this risk.  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 He was just started on a fentanyl patch and still has pain. He actually walked out of his chronic pain visit today because they told him he was have to wait at least 4 hours.  Plan: -Referral to radiation oncology for treatment of thoracic malignancy -Prednisone given today to help with pain, I instructed him not to use this with ibuprofen -Continue fentanyl patch  Current smoker He is certainly high-risk for COPD and with his shortness of breath so we'll obtain a pulmonary function test. I counseled him today on tobacco cessation. He is willing to quit. I gave him the number for the [place] quit line so that he can get nicotine replacement for free.    Updated Medication List Outpatient Encounter Prescriptions as of 08/15/2013  Medication Sig  . albuterol (PROVENTIL HFA;VENTOLIN HFA) 108 (90 BASE) MCG/ACT inhaler Inhale 2 puffs into the lungs every 6 (six) hours as needed for wheezing or shortness of breath.  . cyclobenzaprine (FLEXERIL) 10 MG tablet Take 1 tablet (10 mg total) by mouth 3 (three) times daily as needed for muscle spasms.  Marland Kitchen dexlansoprazole (DEXILANT) 60 MG capsule Take 1 capsule (60 mg total) by mouth daily.  . fentaNYL (DURAGESIC) 25 MCG/HR patch Place 1 patch (25 mcg total) onto the skin every 3 (three) days.  Marland Kitchen ibuprofen (ADVIL,MOTRIN) 800 MG  tablet Take 1 tablet (800 mg total) by mouth every 8 (eight) hours as needed for moderate pain.  . polyethylene glycol (MIRALAX / GLYCOLAX) packet Take 17 g by mouth daily as needed.  Marland Kitchen QUEtiapine (SEROQUEL) 300 MG tablet Take 300 mg by mouth at bedtime.   . traMADol (ULTRAM-ER) 100 MG 24 hr tablet Take 100 mg by mouth 3 (three) times daily as needed for pain.  . [DISCONTINUED] polyethylene glycol (MIRALAX / GLYCOLAX) packet Take 17 g by mouth daily.  . predniSONE (DELTASONE) 10 MG tablet Take 3 tablets (30 mg total) by mouth daily with breakfast.  . [DISCONTINUED] guaiFENesin-dextromethorphan (ROBITUSSIN DM) 100-10 MG/5ML syrup Take 5 mLs by mouth every 4 (four) hours as needed for cough.  . [DISCONTINUED] oxyCODONE-acetaminophen (PERCOCET/ROXICET) 5-325 MG per tablet Take 2 tablets by mouth every 6 (six) hours as needed for severe pain.

## 2013-08-15 NOTE — Assessment & Plan Note (Signed)
[name] [name] has a right middle lobe mass with associated hilar and mediastinal lymphadenopathy which is all very worrisome for bronchogenic lung cancer considering his smoking history. I have explained to the patient and his mother today that I feel like this likely represents metastatic lung cancer.  The most concerning finding is the metastatic lesion on his T4 vertebral body. He does not have weakness on physical exam today nor does he have loss of bowel or bladder function to suggest cord compression. However, we need to get a biopsy of this as soon as possible and to start treatment as soon as possible because of this risk.  Plan: -CT guided needle aspiration of T4 vertebral body within 24 hours, I have discussed with interventional radiology -Refer to [facility] clinic and radiation oncology - If we're unable to get a diagnosis from the needle aspiration I am very happy to perform an endobronchial ultrasound guided needle aspiration of mediastinal lymph nodes

## 2013-08-15 NOTE — Assessment & Plan Note (Signed)
He is certainly high-risk for COPD and with his shortness of breath so we'll obtain a pulmonary function test. I counseled him today on tobacco cessation. He is willing to quit. I gave him the number for the [place] quit line so that he can get nicotine replacement for free.

## 2013-08-15 NOTE — Patient Instructions (Signed)
We will arrange a CT guided biopsy of the spine lesion for this week We will refer you to a thoracic oncology specialist We will set up lung function testing for you Take prednisone daily (don't take the ibuprofen while on this) Call 1-800-QUIT-NOW to get free nicotine replacement from the state of [place] We will see you back in 3-4 weeks or sooner if needed

## 2013-08-15 NOTE — Assessment & Plan Note (Signed)
His back pain is severe and likely related to both his chronic back pain and spinal disc process but also the new metastatic disease. He was just started on a fentanyl patch and still has pain. He actually walked out of his chronic pain visit today because they told him he was have to wait at least 4 hours.  Plan: -Referral to radiation oncology for treatment of thoracic malignancy -Prednisone given today to help with pain, I instructed him not to use this with ibuprofen -Continue fentanyl patch

## 2013-08-15 NOTE — Assessment & Plan Note (Signed)
CT-guided needle biopsy tomorrow Once results available, see radiation oncology

## 2013-08-16 ENCOUNTER — Other Ambulatory Visit: Payer: Self-pay | Admitting: *Deleted

## 2013-08-16 ENCOUNTER — Encounter: Payer: Self-pay | Admitting: *Deleted

## 2013-08-16 ENCOUNTER — Telehealth: Payer: Self-pay | Admitting: *Deleted

## 2013-08-16 ENCOUNTER — Emergency Department (HOSPITAL_COMMUNITY): Payer: Medicare Other

## 2013-08-16 ENCOUNTER — Inpatient Hospital Stay (HOSPITAL_COMMUNITY)
Admission: EM | Admit: 2013-08-16 | Discharge: 2013-08-20 | DRG: 478 | Disposition: A | Discharge status: Home / Self Care | Payer: Medicare Other | Attending: Internal Medicine | Admitting: Internal Medicine

## 2013-08-16 ENCOUNTER — Encounter (HOSPITAL_COMMUNITY): Payer: Self-pay | Admitting: Emergency Medicine

## 2013-08-16 DIAGNOSIS — C801 Malignant (primary) neoplasm, unspecified: Secondary | ICD-10-CM

## 2013-08-16 DIAGNOSIS — A630 Anogenital (venereal) warts: Secondary | ICD-10-CM

## 2013-08-16 DIAGNOSIS — K648 Other hemorrhoids: Secondary | ICD-10-CM

## 2013-08-16 DIAGNOSIS — J449 Chronic obstructive pulmonary disease, unspecified: Secondary | ICD-10-CM

## 2013-08-16 DIAGNOSIS — K439 Ventral hernia without obstruction or gangrene: Secondary | ICD-10-CM

## 2013-08-16 DIAGNOSIS — M545 Low back pain, unspecified: Secondary | ICD-10-CM | POA: Diagnosis present

## 2013-08-16 DIAGNOSIS — E8881 Metabolic syndrome: Secondary | ICD-10-CM

## 2013-08-16 DIAGNOSIS — R918 Other nonspecific abnormal finding of lung field: Secondary | ICD-10-CM

## 2013-08-16 DIAGNOSIS — G8929 Other chronic pain: Secondary | ICD-10-CM

## 2013-08-16 DIAGNOSIS — C3490 Malignant neoplasm of unspecified part of unspecified bronchus or lung: Secondary | ICD-10-CM | POA: Diagnosis present

## 2013-08-16 DIAGNOSIS — K2270 Barrett's esophagus without dysplasia: Secondary | ICD-10-CM

## 2013-08-16 DIAGNOSIS — M549 Dorsalgia, unspecified: Secondary | ICD-10-CM

## 2013-08-16 DIAGNOSIS — R222 Localized swelling, mass and lump, trunk: Secondary | ICD-10-CM

## 2013-08-16 DIAGNOSIS — E785 Hyperlipidemia, unspecified: Secondary | ICD-10-CM | POA: Diagnosis present

## 2013-08-16 DIAGNOSIS — D649 Anemia, unspecified: Secondary | ICD-10-CM

## 2013-08-16 DIAGNOSIS — F319 Bipolar disorder, unspecified: Secondary | ICD-10-CM

## 2013-08-16 DIAGNOSIS — R9389 Abnormal findings on diagnostic imaging of other specified body structures: Secondary | ICD-10-CM

## 2013-08-16 DIAGNOSIS — E876 Hypokalemia: Secondary | ICD-10-CM

## 2013-08-16 DIAGNOSIS — J9801 Acute bronchospasm: Secondary | ICD-10-CM

## 2013-08-16 DIAGNOSIS — I479 Paroxysmal tachycardia, unspecified: Secondary | ICD-10-CM

## 2013-08-16 DIAGNOSIS — C7951 Secondary malignant neoplasm of bone: Secondary | ICD-10-CM

## 2013-08-16 DIAGNOSIS — D72829 Elevated white blood cell count, unspecified: Secondary | ICD-10-CM

## 2013-08-16 DIAGNOSIS — T887XXA Unspecified adverse effect of drug or medicament, initial encounter: Secondary | ICD-10-CM

## 2013-08-16 DIAGNOSIS — A419 Sepsis, unspecified organism: Secondary | ICD-10-CM

## 2013-08-16 DIAGNOSIS — R739 Hyperglycemia, unspecified: Secondary | ICD-10-CM

## 2013-08-16 DIAGNOSIS — E781 Pure hyperglyceridemia: Secondary | ICD-10-CM

## 2013-08-16 DIAGNOSIS — F17200 Nicotine dependence, unspecified, uncomplicated: Secondary | ICD-10-CM

## 2013-08-16 DIAGNOSIS — C799 Secondary malignant neoplasm of unspecified site: Secondary | ICD-10-CM

## 2013-08-16 DIAGNOSIS — T50901A Poisoning by unspecified drugs, medicaments and biological substances, accidental (unintentional), initial encounter: Secondary | ICD-10-CM

## 2013-08-16 DIAGNOSIS — M722 Plantar fascial fibromatosis: Secondary | ICD-10-CM

## 2013-08-16 DIAGNOSIS — K432 Incisional hernia without obstruction or gangrene: Secondary | ICD-10-CM

## 2013-08-16 DIAGNOSIS — R000 Tachycardia, unspecified: Secondary | ICD-10-CM

## 2013-08-16 DIAGNOSIS — J4489 Other specified chronic obstructive pulmonary disease: Secondary | ICD-10-CM | POA: Diagnosis present

## 2013-08-16 DIAGNOSIS — K219 Gastro-esophageal reflux disease without esophagitis: Secondary | ICD-10-CM | POA: Diagnosis present

## 2013-08-16 DIAGNOSIS — C7952 Secondary malignant neoplasm of bone marrow: Principal | ICD-10-CM

## 2013-08-16 DIAGNOSIS — R7301 Impaired fasting glucose: Secondary | ICD-10-CM

## 2013-08-16 DIAGNOSIS — R498 Other voice and resonance disorders: Secondary | ICD-10-CM

## 2013-08-16 HISTORY — DX: Malignant (primary) neoplasm, unspecified: C80.1

## 2013-08-16 LAB — URINALYSIS, ROUTINE W REFLEX MICROSCOPIC
Bilirubin Urine: NEGATIVE
Glucose, UA: NEGATIVE mg/dL
Hgb urine dipstick: NEGATIVE
KETONES UR: NEGATIVE mg/dL
LEUKOCYTES UA: NEGATIVE
NITRITE: NEGATIVE
PROTEIN: NEGATIVE mg/dL
Specific Gravity, Urine: >1.046 — ABNORMAL HIGH (ref 1.005–1.030)
UROBILINOGEN UA: 1 mg/dL (ref 0.0–1.0)
pH: 6 (ref 5.0–8.0)

## 2013-08-16 LAB — CBC
HCT: 39.5 % (ref 39.0–52.0)
Hemoglobin: 12.8 g/dL — ABNORMAL LOW (ref 13.0–17.0)
MCH: 29.1 pg (ref 26.0–34.0)
MCHC: 32.4 g/dL (ref 30.0–36.0)
MCV: 89.8 fL (ref 78.0–100.0)
PLATELETS: 476 10*3/uL — AB (ref 150–400)
RBC: 4.4 MIL/uL (ref 4.22–5.81)
RDW: 16.3 % — AB (ref 11.5–15.5)
WBC: 22.3 10*3/uL — ABNORMAL HIGH (ref 4.0–10.5)

## 2013-08-16 LAB — BASIC METABOLIC PANEL
BUN: 11 mg/dL (ref 6–23)
CALCIUM: 10.4 mg/dL (ref 8.4–10.5)
CO2: 26 mEq/L (ref 19–32)
CREATININE: 0.72 mg/dL (ref 0.50–1.35)
Chloride: 91 mEq/L — ABNORMAL LOW (ref 96–112)
Glucose, Bld: 128 mg/dL — ABNORMAL HIGH (ref 70–99)
Potassium: 4.9 mEq/L (ref 3.7–5.3)
Sodium: 133 mEq/L — ABNORMAL LOW (ref 137–147)

## 2013-08-16 LAB — I-STAT CG4 LACTIC ACID, ED: LACTIC ACID, VENOUS: 2.47 mmol/L — AB (ref 0.5–2.2)

## 2013-08-16 MED ORDER — NICOTINE 14 MG/24HR TD PT24
14.0000 mg | MEDICATED_PATCH | Freq: Once | TRANSDERMAL | Status: AC
  Administered 2013-08-16: 14 mg via TRANSDERMAL
  Filled 2013-08-16: qty 1

## 2013-08-16 MED ORDER — ACETAMINOPHEN 650 MG RE SUPP: 650.0000 mg | Freq: Four times a day (QID) | RECTAL | Status: DC | PRN

## 2013-08-16 MED ORDER — POLYETHYLENE GLYCOL 3350 17 G PO PACK
17.0000 g | PACK | Freq: Every day | ORAL | Status: DC | PRN
  Filled 2013-08-16: qty 1

## 2013-08-16 MED ORDER — ONDANSETRON HCL 4 MG PO TABS
4.0000 mg | ORAL_TABLET | Freq: Four times a day (QID) | ORAL | Status: DC | PRN
  Filled 2013-08-16: qty 1

## 2013-08-16 MED ORDER — HYDROMORPHONE HCL PF 1 MG/ML IJ SOLN
1.0000 mg | Freq: Once | INTRAMUSCULAR | Status: AC
  Administered 2013-08-16: 1 mg via INTRAVENOUS
  Filled 2013-08-16: qty 1

## 2013-08-16 MED ORDER — PIPERACILLIN-TAZOBACTAM 3.375 G IVPB
3.3750 g | Freq: Three times a day (TID) | INTRAVENOUS | Status: DC
  Administered 2013-08-16 – 2013-08-17 (×2): 3.375 g via INTRAVENOUS
  Filled 2013-08-16 (×4): qty 50

## 2013-08-16 MED ORDER — SODIUM CHLORIDE 0.9 % IV SOLN
INTRAVENOUS | Status: DC
  Administered 2013-08-16 – 2013-08-20 (×7): via INTRAVENOUS

## 2013-08-16 MED ORDER — VANCOMYCIN HCL IN DEXTROSE 1-5 GM/200ML-% IV SOLN
1000.0000 mg | Freq: Three times a day (TID) | INTRAVENOUS | Status: DC
  Administered 2013-08-17 (×2): 1000 mg via INTRAVENOUS
  Filled 2013-08-16 (×4): qty 200

## 2013-08-16 MED ORDER — SODIUM CHLORIDE 0.9 % IV BOLUS (SEPSIS)
1000.0000 mL | Freq: Once | INTRAVENOUS | Status: AC
  Administered 2013-08-16: 1000 mL via INTRAVENOUS

## 2013-08-16 MED ORDER — ONDANSETRON HCL 4 MG/2ML IJ SOLN
4.0000 mg | Freq: Once | INTRAMUSCULAR | Status: AC
Start: 2013-08-16 — End: 2013-08-16
  Administered 2013-08-16: 4 mg via INTRAVENOUS
  Filled 2013-08-16: qty 2

## 2013-08-16 MED ORDER — QUETIAPINE FUMARATE 300 MG PO TABS
300.0000 mg | ORAL_TABLET | Freq: Every day | ORAL | Status: DC
  Administered 2013-08-16 – 2013-08-19 (×4): 300 mg via ORAL
  Filled 2013-08-16 (×6): qty 1

## 2013-08-16 MED ORDER — ALBUTEROL SULFATE (2.5 MG/3ML) 0.083% IN NEBU: 2.5000 mg | INHALATION_SOLUTION | RESPIRATORY_TRACT | Status: DC | PRN

## 2013-08-16 MED ORDER — FENTANYL 25 MCG/HR TD PT72: 25.0000 ug | MEDICATED_PATCH | TRANSDERMAL | Status: DC

## 2013-08-16 MED ORDER — PIPERACILLIN-TAZOBACTAM 3.375 G IVPB
3.3750 g | Freq: Once | INTRAVENOUS | Status: AC
  Administered 2013-08-16: 3.375 g via INTRAVENOUS
  Filled 2013-08-16 (×2): qty 50

## 2013-08-16 MED ORDER — HYDROMORPHONE HCL PF 1 MG/ML IJ SOLN
0.5000 mg | INTRAMUSCULAR | Status: DC | PRN
  Administered 2013-08-16 – 2013-08-20 (×18): 0.5 mg via INTRAVENOUS
  Filled 2013-08-16 (×19): qty 1

## 2013-08-16 MED ORDER — OXYCODONE-ACETAMINOPHEN 5-325 MG PO TABS
2.0000 | ORAL_TABLET | ORAL | Status: DC | PRN
  Administered 2013-08-16 – 2013-08-20 (×20): 2 via ORAL
  Filled 2013-08-16 (×20): qty 2

## 2013-08-16 MED ORDER — ONDANSETRON HCL 4 MG/2ML IJ SOLN: 4.0000 mg | Freq: Four times a day (QID) | INTRAMUSCULAR | Status: DC | PRN

## 2013-08-16 MED ORDER — ACETAMINOPHEN 325 MG PO TABS: 650.0000 mg | ORAL_TABLET | Freq: Four times a day (QID) | ORAL | Status: DC | PRN

## 2013-08-16 MED ORDER — CYCLOBENZAPRINE HCL 10 MG PO TABS
10.0000 mg | ORAL_TABLET | Freq: Three times a day (TID) | ORAL | Status: DC | PRN
  Administered 2013-08-16 – 2013-08-19 (×5): 10 mg via ORAL
  Filled 2013-08-16 (×6): qty 1

## 2013-08-16 MED ORDER — VANCOMYCIN HCL IN DEXTROSE 1-5 GM/200ML-% IV SOLN
1000.0000 mg | Freq: Once | INTRAVENOUS | Status: AC
  Administered 2013-08-16: 1000 mg via INTRAVENOUS
  Filled 2013-08-16: qty 200

## 2013-08-16 MED ORDER — PANTOPRAZOLE SODIUM 40 MG PO TBEC
40.0000 mg | DELAYED_RELEASE_TABLET | Freq: Every day | ORAL | Status: DC
  Administered 2013-08-17 – 2013-08-20 (×4): 40 mg via ORAL
  Filled 2013-08-16 (×4): qty 1

## 2013-08-16 MED ORDER — IOHEXOL 300 MG/ML  SOLN
100.0000 mL | Freq: Once | INTRAMUSCULAR | Status: AC | PRN
  Administered 2013-08-16: 100 mL via INTRAVENOUS

## 2013-08-16 NOTE — ED Notes (Signed)
Made patient aware that we needed a urine sample he stated he is unable to go at this time
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
## 2013-08-16 NOTE — ED Notes (Signed)
Pt c/o back and hip pain; states has cancer in his back and feels like has moved to his hip;

## 2013-08-16 NOTE — ED Notes (Signed)
Patient aware again that we need a urine and he stated he still can not go

## 2013-08-16 NOTE — Progress Notes (Signed)
ANTIBIOTIC CONSULT NOTE - INITIAL  Pharmacy Consult for Vancomycin & Zosyn Indication: Sepsis/ r/o PNA  Allergies  Allergen Reactions  . Codeine Shortness Of Breath and Nausea And Vomiting       . Morphine Shortness Of Breath and Nausea And Vomiting         Patient Measurements: Height: 5\' 9"  (175.3 cm) Weight: 205 lb 0.4 oz (93 kg) IBW/kg (Calculated) : 70.7  Vital Signs: Temp: 98.3 F (36.8 C) (03/10 1201) Temp src: Oral (03/10 1201) BP: 143/84 mmHg (03/10 0851) Pulse Rate: 106 (03/10 1201) Intake/Output from previous day:   Intake/Output from this shift: Total I/O In: -  Out: 400 [Urine:400]  Labs:  Recent Labs  08/15/13 1523 08/16/13 0920  WBC 18.7 Repeated and verified X2.* 22.3*  HGB 12.3* 12.8*  PLT 406.0 Repeated and verified X2.* 476*  CREATININE  --  0.72   Estimated Creatinine Clearance: 127.1 ml/min (by C-G formula based on Cr of 0.72). No results found for this basename: VANCOTROUGH, Corlis Leak, VANCORANDOM, Chadron, GENTPEAK, GENTRANDOM, TOBRATROUGH, TOBRAPEAK, TOBRARND, AMIKACINPEAK, AMIKACINTROU, AMIKACIN,  in the last 72 hours   Microbiology: Recent Results (from the past 720 hour(s))  MRSA PCR SCREENING     Status: None   Collection Time    08/05/13 10:47 PM      Result Value Ref Range Status   MRSA by PCR NEGATIVE  NEGATIVE Final   Comment:            The GeneXpert MRSA Assay (FDA     approved for NASAL specimens     only), is one component of a     comprehensive MRSA colonization     surveillance program. It is not     intended to diagnose MRSA     infection nor to guide or     monitor treatment for     MRSA infections.    Medical History: Past Medical History  Diagnosis Date  . Depression     bipolar  . Hyperlipidemia   . GERD (gastroesophageal reflux disease)     barrett's  . Bipolar 1 disorder   . Pneumonia   . Impaired fasting glucose 08/06/2013  . Normocytic anemia 08/05/2013  . Drug overdose 08/05/2013  . Cancer      Medications:  Scheduled:  . [START ON 08/18/2013] fentaNYL  25 mcg Transdermal Q72H  . nicotine  14 mg Transdermal Once  . pantoprazole  40 mg Oral Daily  . QUEtiapine  300 mg Oral QHS   Infusions:  . sodium chloride    . vancomycin     Assessment:  48 yr male with recent diagnosis of lung cancer with vertebral mets presents with complaint of SOB, fever and cough.  Patient with recent pneumonia (Jan 2015)  Temp = 98.87F, WBC 22.3, Scr = 0.72 with CrCl > 120 ml/min  Blood cultures x 2 ordered  Zosyn 3.375gm IV x 1 given in ED @ 14:30  Vancomycin 1gm IV x 1 ordered in Ed (not yet charted as given)  Pharmacy asked to dose Vancomycin and Zosyn for sepsis with possible vancomycin  Goal of Therapy:  Vancomycin trough level 15-20 mcg/ml  Plan:  Measure antibiotic drug levels at steady state Follow up culture results Zosyn 3.375gm IV q8h (each dose infused over 4 hrs) Vancomycin 1gm IV q8h  Estephany Perot, Toribio Harbour, PharmD 08/16/2013,3:11 PM

## 2013-08-16 NOTE — [facility] Oncology Navigator Note (Unsigned)
Spoke with pt and family at [facility] ED today.  Gave them appt for Radiation Oncology tomorrow.  Family stated he was going to be admitted.  I will notify Radiation Oncology.

## 2013-08-16 NOTE — Telephone Encounter (Signed)
Called left vm message regarding appt with Dr. Valere Dross 08/17/12 arrive at 12:45. I also, left my name and phone number to call with questions

## 2013-08-16 NOTE — ED Notes (Signed)
Dr Canary Brim aware of abnormal lactic acid result.

## 2013-08-16 NOTE — ED Notes (Signed)
Care handoff note charted at 1621 was documented on wrong patient

## 2013-08-16 NOTE — Progress Notes (Signed)
Received referral today.  CT Chest indicated spine mets.  Pt is having MRI thoracic spine today.  I will page Dr. Tammi Klippel to notify.

## 2013-08-17 ENCOUNTER — Ambulatory Visit
Admit: 2013-08-17 | Discharge: 2013-08-17 | Disposition: A | Discharge status: Home / Self Care | Payer: Medicare Other | Attending: Radiation Oncology | Admitting: Radiation Oncology

## 2013-08-17 ENCOUNTER — Inpatient Hospital Stay (HOSPITAL_COMMUNITY): Payer: Medicare Other

## 2013-08-17 ENCOUNTER — Encounter (HOSPITAL_COMMUNITY): Payer: Self-pay | Admitting: Radiology

## 2013-08-17 DIAGNOSIS — C801 Malignant (primary) neoplasm, unspecified: Principal | ICD-10-CM

## 2013-08-17 DIAGNOSIS — C799 Secondary malignant neoplasm of unspecified site: Secondary | ICD-10-CM

## 2013-08-17 DIAGNOSIS — C7951 Secondary malignant neoplasm of bone: Secondary | ICD-10-CM

## 2013-08-17 LAB — BASIC METABOLIC PANEL
BUN: 10 mg/dL (ref 6–23)
CHLORIDE: 98 meq/L (ref 96–112)
CO2: 26 meq/L (ref 19–32)
CREATININE: 0.68 mg/dL (ref 0.50–1.35)
Calcium: 9 mg/dL (ref 8.4–10.5)
GFR calc Af Amer: >90 mL/min (ref >90)
GFR calc non Af Amer: >90 mL/min (ref >90)
Glucose, Bld: 107 mg/dL — ABNORMAL HIGH (ref 70–99)
POTASSIUM: 3.6 meq/L — AB (ref 3.7–5.3)
Sodium: 137 mEq/L (ref 137–147)

## 2013-08-17 LAB — CBC
HEMATOCRIT: 33.8 % — AB (ref 39.0–52.0)
HEMOGLOBIN: 10.4 g/dL — AB (ref 13.0–17.0)
MCH: 28.1 pg (ref 26.0–34.0)
MCHC: 30.8 g/dL (ref 30.0–36.0)
MCV: 91.4 fL (ref 78.0–100.0)
Platelets: 307 10*3/uL (ref 150–400)
RBC: 3.7 MIL/uL — AB (ref 4.22–5.81)
RDW: 16.4 % — ABNORMAL HIGH (ref 11.5–15.5)
WBC: 13.4 10*3/uL — AB (ref 4.0–10.5)

## 2013-08-17 MED ORDER — HYDROMORPHONE HCL PF 1 MG/ML IJ SOLN
0.5000 mg | Freq: Once | INTRAMUSCULAR | Status: AC
  Administered 2013-08-17: 0.5 mg via INTRAVENOUS

## 2013-08-17 MED ORDER — FENTANYL CITRATE 0.05 MG/ML IJ SOLN
INTRAMUSCULAR | Status: AC | PRN
Start: 2013-08-17 — End: 2013-08-17
  Administered 2013-08-17 (×2): 50 ug via INTRAVENOUS
  Administered 2013-08-17: 100 ug via INTRAVENOUS

## 2013-08-17 MED ORDER — MORPHINE SULFATE ER 15 MG PO TBCR
15.0000 mg | EXTENDED_RELEASE_TABLET | Freq: Two times a day (BID) | ORAL | Status: DC
  Filled 2013-08-17: qty 1

## 2013-08-17 MED ORDER — FENTANYL CITRATE 0.05 MG/ML IJ SOLN
INTRAMUSCULAR | Status: AC
  Filled 2013-08-17: qty 6

## 2013-08-17 MED ORDER — FENTANYL 100 MCG/HR TD PT72
100.0000 ug | MEDICATED_PATCH | TRANSDERMAL | Status: DC
  Administered 2013-08-18: 100 ug via TRANSDERMAL
  Filled 2013-08-17: qty 1

## 2013-08-17 MED ORDER — OXYCODONE HCL ER 15 MG PO T12A
15.0000 mg | EXTENDED_RELEASE_TABLET | Freq: Two times a day (BID) | ORAL | Status: DC
  Administered 2013-08-17 – 2013-08-20 (×7): 15 mg via ORAL
  Filled 2013-08-17 (×7): qty 1

## 2013-08-17 MED ORDER — MIDAZOLAM HCL 2 MG/2ML IJ SOLN
INTRAMUSCULAR | Status: AC | PRN
  Administered 2013-08-17 (×4): 1 mg via INTRAVENOUS

## 2013-08-17 MED ORDER — MIDAZOLAM HCL 2 MG/2ML IJ SOLN
INTRAMUSCULAR | Status: AC
  Filled 2013-08-17: qty 6

## 2013-08-17 MED ORDER — BOOST PLUS PO LIQD
237.0000 mL | Freq: Two times a day (BID) | ORAL | Status: DC
  Administered 2013-08-17: 237 mL via ORAL
  Filled 2013-08-17 (×4): qty 237

## 2013-08-17 MED ORDER — MORPHINE SULFATE ER 15 MG PO TBCR: 15.0000 mg | EXTENDED_RELEASE_TABLET | Freq: Two times a day (BID) | ORAL | Status: DC

## 2013-08-17 NOTE — Procedures (Signed)
Technically successful CT guided biopsy of lytic destructive lesion in the post aspect of the left iliac crest.  No immediate post procedural complications.

## 2013-08-17 NOTE — Care Management Note (Signed)
    Page 1 of 1   08/17/2013     11:07:36 AM   CARE MANAGEMENT NOTE 08/17/2013  Patient:  [name] [name], [name] [name]   Account Number:  [id number]  Date Initiated:  08/17/2013  Documentation initiated by:  Dessa Phi  Subjective/Objective Assessment:   48 Y/O M ADMITTED W/SOB.R LUNG MASS.READMIT 2/27-08/07/13-DRUG OVERDOSE.     Action/Plan:   FROM HOME.HAS PCP,PHARMACY.   Anticipated DC Date:  08/19/2013   Anticipated DC Plan:  [place]  CM consult      Choice offered to / List presented to:             Status of service:  In process, will continue to follow Medicare Important Message given?   (If response is "NO", the following Medicare IM given date fields will be blank) Date Medicare IM given:   Date Additional Medicare IM given:    Discharge Disposition:    Per UR Regulation:  Reviewed for med. necessity/level of care/duration of stay  If discussed at [place] of Stay Meetings, dates discussed:    Comments:  08/17/13 Sadako Cegielski RN,BSN NCM [phone] S/P BIOPSY-?LUNG CA, DESATS ON 02.WILL MONITOR FOR NEED FOR HOME 02.
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
## 2013-08-17 NOTE — Progress Notes (Signed)
The patient was taken to the CT simulator and placed supine. A Vac-Lok immobilization device was constructed. His pelvis was scanned. I chose and isocenter along the left sacrum. The CT data set was sent to the planning system for contouring of his CTV/lytic disease. He was set up to AP, PA, and right left lateral fields. 4 separate multileaf collimators were designed to conform the field. I prescribing 2000 cGy in 5 sessions. An isodose plan is requested.

## 2013-08-18 ENCOUNTER — Ambulatory Visit (HOSPITAL_COMMUNITY): Admission: RE | Admit: 2013-08-18 | Payer: Medicare Other | Source: Ambulatory Visit

## 2013-08-18 ENCOUNTER — Ambulatory Visit
Admit: 2013-08-18 | Discharge: 2013-08-18 | Disposition: A | Discharge status: Home / Self Care | Payer: Medicare Other | Attending: Radiation Oncology | Admitting: Radiation Oncology

## 2013-08-18 DIAGNOSIS — C799 Secondary malignant neoplasm of unspecified site: Secondary | ICD-10-CM

## 2013-08-18 DIAGNOSIS — C801 Malignant (primary) neoplasm, unspecified: Secondary | ICD-10-CM

## 2013-08-18 DIAGNOSIS — C7951 Secondary malignant neoplasm of bone: Secondary | ICD-10-CM

## 2013-08-18 MED ORDER — DOCUSATE SODIUM 100 MG PO CAPS
100.0000 mg | ORAL_CAPSULE | Freq: Two times a day (BID) | ORAL | Status: DC
  Administered 2013-08-18 – 2013-08-20 (×5): 100 mg via ORAL
  Filled 2013-08-18 (×7): qty 1

## 2013-08-18 MED ORDER — HYDROMORPHONE HCL PF 1 MG/ML IJ SOLN
1.0000 mg | Freq: Once | INTRAMUSCULAR | Status: AC
  Administered 2013-08-19: 1 mg via INTRAVENOUS
  Filled 2013-08-18: qty 1

## 2013-08-18 MED ORDER — ENSURE COMPLETE PO LIQD
237.0000 mL | Freq: Two times a day (BID) | ORAL | Status: DC
  Administered 2013-08-18: 237 mL via ORAL

## 2013-08-18 MED ORDER — POLYETHYLENE GLYCOL 3350 17 G PO PACK
17.0000 g | PACK | Freq: Every day | ORAL | Status: DC
  Administered 2013-08-18 – 2013-08-20 (×3): 17 g via ORAL
  Filled 2013-08-18 (×3): qty 1

## 2013-08-18 NOTE — Progress Notes (Signed)
Simulation verification note: The patient underwent simulation verification for treatment to his sacrum. His isocenter is in good position and the multileaf collimators contoured the treatment volume approriately.

## 2013-08-18 NOTE — Progress Notes (Signed)
[facility] PROGRESS NOTE  [name] [name] EHM:[id number] DOB: [birth year] DOA: 08/16/2013 PCP: [name], MD  Assessment/Plan: Bone metastasis from presumed lung cancer -s/p biopsy today with path pending. -To start radiation today. -Continues to have issues with pain control, altho definitively improved since addition of oxycontin.  ?PNA -No evidence of such on CXR. -After discussion with patient, he states he never had any complaints of SOB, cough. -Will DC antibiotics at this venue.  Likely lung cancer -Has had biopsy and radiation to start in am.  Uncontrolled Back Pain -Please see above for details.  Code Status: Full Code Family Communication: Mother and nephew at bedside.  Disposition Plan: To be determined   Consultants:  Rad onc  onc  IR   Antibiotics:  None   Subjective: Complains of uncontrolled pain.  Objective: Filed Vitals:   08/17/13 1500 08/17/13 2028 08/18/13 0445 08/18/13 1339  BP: 130/77 123/86 123/67 137/82  Pulse: 105 109 104 94  Temp: 98.4 F (36.9 C) 98.2 F (36.8 C) 98.3 F (36.8 C) 98.3 F (36.8 C)  TempSrc: Oral Oral Oral Oral  Resp: 16 16 16 18   Height:      Weight:      SpO2: 92% 91% 94% 95%    Intake/Output Summary (Last 24 hours) at 08/18/13 1453 Last data filed at 08/18/13 1049  Gross per 24 hour  Intake   2085 ml  Output   1675 ml  Net    410 ml   Filed Weights   08/16/13 1506 08/16/13 1807  Weight: 93 kg (205 lb 0.4 oz) 92.3 kg (203 lb 7.8 oz)    Exam:   General:  AA Ox3  Cardiovascular: RRR  Respiratory: CTA B  Abdomen: S/NT/ND/+BS   Extremities: no C/C/E   Neurologic:  Intact, non-focal.  Data Reviewed: Basic Metabolic Panel:  Recent Labs Lab 08/16/13 0920 08/17/13 0345  NA 133* 137  K 4.9 3.6*  CL 91* 98  CO2 26 26  GLUCOSE 128* 107*  BUN 11 10  CREATININE 0.72 0.68  CALCIUM 10.4 9.0   Liver Function Tests: No results found for this basename: AST, ALT, ALKPHOS, BILITOT,  PROT, ALBUMIN,  in the last 168 hours No results found for this basename: LIPASE, AMYLASE,  in the last 168 hours No results found for this basename: AMMONIA,  in the last 168 hours CBC:  Recent Labs Lab 08/15/13 1523 08/16/13 0920 08/17/13 0345  WBC 18.7 Repeated and verified X2.* 22.3* 13.4*  HGB 12.3* 12.8* 10.4*  HCT 38.2* 39.5 33.8*  MCV 88.8 89.8 91.4  PLT 406.0 Repeated and verified X2.* 476* 307   Cardiac Enzymes: No results found for this basename: CKTOTAL, CKMB, CKMBINDEX, TROPONINI,  in the last 168 hours BNP (last 3 results) No results found for this basename: PROBNP,  in the last 8760 hours CBG: No results found for this basename: GLUCAP,  in the last 168 hours  Recent Results (from the past 240 hour(s))  CULTURE, BLOOD (ROUTINE X 2)     Status: None   Collection Time    08/16/13 11:35 AM      Result Value Ref Range Status   Specimen Description BLOOD LEFT ARM   Final   Special Requests BOTTLES DRAWN AEROBIC ONLY 4CC   Final   Culture  Setup Time     Final   Value: 08/16/2013 14:00     Performed at [facility]   Culture     Final   Value:  BLOOD CULTURE RECEIVED NO GROWTH TO DATE CULTURE WILL BE HELD FOR 5 DAYS BEFORE ISSUING A FINAL NEGATIVE REPORT     Performed at [facility]   Report Status PENDING   Incomplete  CULTURE, BLOOD (ROUTINE X 2)     Status: None   Collection Time    08/16/13 11:40 AM      Result Value Ref Range Status   Specimen Description BLOOD LEFT ARM   Final   Special Requests BOTTLES DRAWN AEROBIC AND ANAEROBIC [facility] EACH   Final   Culture  Setup Time     Final   Value: 08/16/2013 14:00     Performed at [facility]   Culture     Final   Value:        BLOOD CULTURE RECEIVED NO GROWTH TO DATE CULTURE WILL BE HELD FOR 5 DAYS BEFORE ISSUING A FINAL NEGATIVE REPORT     Performed at [facility]   Report Status PENDING   Incomplete     Studies: Ct Biopsy  08/17/2013   INDICATION History of  presumed metastatic lung cancer.  Please perform  EXAM CT-GUIDED BIOPSY OF LYTIC, DESTRUCTIVE LESION INVOLVING THE POSTERIOR ASPECT OF THE LEFT ILIAC CREST.  MEDICATIONS Fentanyl 200 mcg IV; Versed 4 mg IV  ANESTHESIA/SEDATION Sedation Time  10 minutes  CONTRAST None  COMPLICATIONS None immediate.  PROCEDURE Informed consent was obtained from the patient following an explanation of the procedure, risks, benefits and alternatives. The patient understands, agrees and consents for the procedure. All questions were addressed. A time out was performed prior to the initiation of the procedure. The patient was positioned prone and non-contrast localization CT was performed of the pelvis to demonstrate the known lytic and destructive mass involving the posterior aspect of the left iliac crest. The operative site was prepped and draped in the usual sterile fashion.  Under sterile conditions and local anesthesia, a 22 gauge spinal needle was utilized for procedural planning. Next, an 11 gauge coaxial bone biopsy needle was advanced into the lytic/destructive mass within the posterior aspect of the left iliac crest. Needle position was confirmed with CT imaging. A bone biopsy was obtained with the 11 gauge outer bone marrow device. The 11 gauge coaxial bone biopsy needle was re-advanced into a slightly different location within the lytic/destructive mass, appropriate positioning was confirmed and an additional bone biopsy was obtained. Samples were prepared with the cytotechnologist and deemed adequate. The needle was removed intact. Hemostasis was obtained with compression and a dressing was placed. The patient tolerated the procedure well without immediate post procedural complication.  IMPRESSION Technically successful CT guided biopsy of lytic / destructive mass within the posterior aspect of the left iliac crest.  SIGNATURE  Electronically Signed   By: Sandi Mariscal M.D.   On: 08/17/2013 10:10    Scheduled Meds: .  docusate sodium  100 mg Oral BID  . feeding supplement (ENSURE COMPLETE)  237 mL Oral BID BM  . fentaNYL  100 mcg Transdermal Q72H  . OxyCODONE  15 mg Oral Q12H  . pantoprazole  40 mg Oral Daily  . polyethylene glycol  17 g Oral Daily  . QUEtiapine  300 mg Oral QHS   Continuous Infusions: . sodium chloride 75 mL/hr at 08/18/13 1201    Principal Problem:   Metastatic cancer Active Problems:   Lung mass   Tachycardia   COPD (chronic obstructive pulmonary disease)    Time spent: 25 minutes. Greater than 50% of  this time was spent in direct contact with the patient coordinating care.    Lelon Frohlich  [facility] Pager [phone]  If 7PM-7AM, please contact night-coverage at [email or web address], password [facility] 08/18/2013, 2:53 PM  LOS: 2 days

## 2013-08-19 ENCOUNTER — Ambulatory Visit
Admit: 2013-08-19 | Discharge: 2013-08-19 | Disposition: A | Discharge status: Home / Self Care | Payer: Medicare Other | Attending: Radiation Oncology | Admitting: Radiation Oncology

## 2013-08-19 DIAGNOSIS — C801 Malignant (primary) neoplasm, unspecified: Secondary | ICD-10-CM

## 2013-08-19 NOTE — Progress Notes (Signed)
[facility] PROGRESS NOTE  [name] [name] HCW:[id number] DOB: 09-29-1965 DOA: 08/16/2013 PCP: [name], MD  Assessment/Plan: Bone metastasis from presumed lung cancer -s/p biopsy: metastatic lung adenocarcinoma: will discuss with Dr. Julien Nordmann. - radiation today. -Continues to have issues with pain control, altho definitively improved since addition of oxycontin. -Likely DC home in am  ?PNA -No evidence of such on CXR. -After discussion with patient, he states he never had any complaints of SOB, cough. -Will DC antibiotics at this venue.  Metastatic Adenocarcinoma of the Lung  -Biopsy has confirmed. -Will discuss with Heme/Onc.  Uncontrolled Back Pain -Please see above for details. -Much improved. -Suspect might be ready for DC home in am.  Code Status: Full Code Family Communication: Mother and father at bedside updated on plan of care.  Disposition Plan: Home; likely 24-48 hours.   Consultants:  Rad onc  onc  IR   Antibiotics:  None   Subjective: Complains of uncontrolled pain. His demeanor appears much calmer today.  Objective: Filed Vitals:   08/18/13 1339 08/18/13 2050 08/19/13 0456 08/19/13 1350  BP: 137/82 130/72 118/76 111/82  Pulse: 94 99 102 88  Temp: 98.3 F (36.8 C) 98.3 F (36.8 C) 97.9 F (36.6 C) 98.3 F (36.8 C)  TempSrc: Oral Oral Oral Oral  Resp: 18 16 16 18   Height:      Weight:      SpO2: 95% 94% 94% 94%    Intake/Output Summary (Last 24 hours) at 08/19/13 1445 Last data filed at 08/19/13 0600  Gross per 24 hour  Intake   2685 ml  Output    300 ml  Net   2385 ml   Filed Weights   08/16/13 1506 08/16/13 1807  Weight: 93 kg (205 lb 0.4 oz) 92.3 kg (203 lb 7.8 oz)    Exam:   General:  AA Ox3  Cardiovascular: RRR  Respiratory: CTA B  Abdomen: S/NT/ND/+BS   Extremities: no C/C/E   Neurologic:  Intact, non-focal.  Data Reviewed: Basic Metabolic Panel:  Recent Labs Lab 08/16/13 0920 08/17/13 0345  NA  133* 137  K 4.9 3.6*  CL 91* 98  CO2 26 26  GLUCOSE 128* 107*  BUN 11 10  CREATININE 0.72 0.68  CALCIUM 10.4 9.0   Liver Function Tests: No results found for this basename: AST, ALT, ALKPHOS, BILITOT, PROT, ALBUMIN,  in the last 168 hours No results found for this basename: LIPASE, AMYLASE,  in the last 168 hours No results found for this basename: AMMONIA,  in the last 168 hours CBC:  Recent Labs Lab 08/15/13 1523 08/16/13 0920 08/17/13 0345  WBC 18.7 Repeated and verified X2.* 22.3* 13.4*  HGB 12.3* 12.8* 10.4*  HCT 38.2* 39.5 33.8*  MCV 88.8 89.8 91.4  PLT 406.0 Repeated and verified X2.* 476* 307   Cardiac Enzymes: No results found for this basename: CKTOTAL, CKMB, CKMBINDEX, TROPONINI,  in the last 168 hours BNP (last 3 results) No results found for this basename: PROBNP,  in the last 8760 hours CBG: No results found for this basename: GLUCAP,  in the last 168 hours  Recent Results (from the past 240 hour(s))  CULTURE, BLOOD (ROUTINE X 2)     Status: None   Collection Time    08/16/13 11:35 AM      Result Value Ref Range Status   Specimen Description BLOOD LEFT ARM   Final   Special Requests BOTTLES DRAWN AEROBIC ONLY [place]   Final   Culture  Setup  Time     Final   Value: 08/16/2013 14:00     Performed at [facility]   Culture     Final   Value:        BLOOD CULTURE RECEIVED NO GROWTH TO DATE CULTURE WILL BE HELD FOR 5 DAYS BEFORE ISSUING A FINAL NEGATIVE REPORT     Performed at [facility]   Report Status PENDING   Incomplete  CULTURE, BLOOD (ROUTINE X 2)     Status: None   Collection Time    08/16/13 11:40 AM      Result Value Ref Range Status   Specimen Description BLOOD LEFT ARM   Final   Special Requests BOTTLES DRAWN AEROBIC AND ANAEROBIC [facility] EACH   Final   Culture  Setup Time     Final   Value: 08/16/2013 14:00     Performed at [facility]   Culture     Final   Value:        BLOOD CULTURE RECEIVED NO GROWTH TO DATE CULTURE  WILL BE HELD FOR 5 DAYS BEFORE ISSUING A FINAL NEGATIVE REPORT     Performed at [facility]   Report Status PENDING   Incomplete     Studies: No results found.  Scheduled Meds: . docusate sodium  100 mg Oral BID  . feeding supplement (ENSURE COMPLETE)  237 mL Oral BID BM  . fentaNYL  100 mcg Transdermal Q72H  .  HYDROmorphone (DILAUDID) injection  1 mg Intravenous Once  . OxyCODONE  15 mg Oral Q12H  . pantoprazole  40 mg Oral Daily  . polyethylene glycol  17 g Oral Daily  . QUEtiapine  300 mg Oral QHS   Continuous Infusions: . sodium chloride 75 mL/hr at 08/19/13 0110    Principal Problem:   Metastatic cancer Active Problems:   Lung mass   Tachycardia   COPD (chronic obstructive pulmonary disease)    Time spent: 25 minutes. Greater than 50% of this time was spent in direct contact with the patient coordinating care.    Lelon Frohlich  [facility] Pager [phone]  If 7PM-7AM, please contact night-coverage at [email or web address], password [facility] 08/19/2013, 2:45 PM  LOS: 3 days

## 2013-08-20 MED ORDER — HYDROMORPHONE HCL PF 1 MG/ML IJ SOLN
INTRAMUSCULAR | Status: AC
  Filled 2013-08-20: qty 1

## 2013-08-20 MED ORDER — DSS 100 MG PO CAPS: 100.0000 mg | ORAL_CAPSULE | Freq: Two times a day (BID) | ORAL | Status: DC

## 2013-08-20 MED ORDER — OXYCODONE HCL ER 15 MG PO T12A: 15.0000 mg | EXTENDED_RELEASE_TABLET | Freq: Two times a day (BID) | ORAL | Status: DC

## 2013-08-20 MED ORDER — OXYCODONE-ACETAMINOPHEN 5-325 MG PO TABS: 2.0000 | ORAL_TABLET | ORAL | Status: DC | PRN

## 2013-08-20 MED ORDER — HYDROMORPHONE HCL PF 1 MG/ML IJ SOLN
1.0000 mg | Freq: Once | INTRAMUSCULAR | Status: AC
  Administered 2013-08-20: 1 mg via INTRAVENOUS
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
## 2013-08-20 NOTE — Progress Notes (Signed)
Discharge instructions explained to patient and mother, and prescriptions given. Stable on discharge.

## 2013-08-22 ENCOUNTER — Ambulatory Visit: Payer: Medicare Other

## 2013-08-22 ENCOUNTER — Encounter (HOSPITAL_COMMUNITY): Payer: Self-pay | Admitting: Emergency Medicine

## 2013-08-22 ENCOUNTER — Inpatient Hospital Stay (HOSPITAL_COMMUNITY)
Admission: EM | Admit: 2013-08-22 | Discharge: 2013-08-29 | DRG: 543 | Disposition: A | Discharge status: Home / Self Care | Payer: Medicare Other | Attending: Internal Medicine | Admitting: Internal Medicine

## 2013-08-22 ENCOUNTER — Emergency Department (HOSPITAL_COMMUNITY): Payer: Medicare Other

## 2013-08-22 ENCOUNTER — Encounter: Payer: Medicare Other | Admitting: Radiation Oncology

## 2013-08-22 ENCOUNTER — Telehealth: Payer: Self-pay | Admitting: *Deleted

## 2013-08-22 DIAGNOSIS — D72829 Elevated white blood cell count, unspecified: Secondary | ICD-10-CM

## 2013-08-22 DIAGNOSIS — Z6829 Body mass index (BMI) 29.0-29.9, adult: Secondary | ICD-10-CM

## 2013-08-22 DIAGNOSIS — G893 Neoplasm related pain (acute) (chronic): Secondary | ICD-10-CM

## 2013-08-22 DIAGNOSIS — M545 Low back pain, unspecified: Secondary | ICD-10-CM | POA: Diagnosis present

## 2013-08-22 DIAGNOSIS — C7951 Secondary malignant neoplasm of bone: Principal | ICD-10-CM

## 2013-08-22 DIAGNOSIS — Z515 Encounter for palliative care: Secondary | ICD-10-CM

## 2013-08-22 DIAGNOSIS — D649 Anemia, unspecified: Secondary | ICD-10-CM

## 2013-08-22 DIAGNOSIS — R197 Diarrhea, unspecified: Secondary | ICD-10-CM | POA: Diagnosis present

## 2013-08-22 DIAGNOSIS — C799 Secondary malignant neoplasm of unspecified site: Secondary | ICD-10-CM

## 2013-08-22 DIAGNOSIS — F319 Bipolar disorder, unspecified: Secondary | ICD-10-CM

## 2013-08-22 DIAGNOSIS — F3130 Bipolar disorder, current episode depressed, mild or moderate severity, unspecified: Secondary | ICD-10-CM | POA: Diagnosis present

## 2013-08-22 DIAGNOSIS — K219 Gastro-esophageal reflux disease without esophagitis: Secondary | ICD-10-CM | POA: Diagnosis present

## 2013-08-22 DIAGNOSIS — C7952 Secondary malignant neoplasm of bone marrow: Principal | ICD-10-CM

## 2013-08-22 DIAGNOSIS — C3490 Malignant neoplasm of unspecified part of unspecified bronchus or lung: Secondary | ICD-10-CM

## 2013-08-22 DIAGNOSIS — J449 Chronic obstructive pulmonary disease, unspecified: Secondary | ICD-10-CM

## 2013-08-22 DIAGNOSIS — M549 Dorsalgia, unspecified: Secondary | ICD-10-CM

## 2013-08-22 DIAGNOSIS — R634 Abnormal weight loss: Secondary | ICD-10-CM | POA: Diagnosis present

## 2013-08-22 DIAGNOSIS — C7949 Secondary malignant neoplasm of other parts of nervous system: Secondary | ICD-10-CM

## 2013-08-22 DIAGNOSIS — R110 Nausea: Secondary | ICD-10-CM | POA: Diagnosis present

## 2013-08-22 DIAGNOSIS — F17200 Nicotine dependence, unspecified, uncomplicated: Secondary | ICD-10-CM | POA: Diagnosis present

## 2013-08-22 DIAGNOSIS — F1010 Alcohol abuse, uncomplicated: Secondary | ICD-10-CM | POA: Diagnosis present

## 2013-08-22 DIAGNOSIS — T380X5A Adverse effect of glucocorticoids and synthetic analogues, initial encounter: Secondary | ICD-10-CM | POA: Diagnosis present

## 2013-08-22 DIAGNOSIS — C342 Malignant neoplasm of middle lobe, bronchus or lung: Secondary | ICD-10-CM

## 2013-08-22 DIAGNOSIS — E785 Hyperlipidemia, unspecified: Secondary | ICD-10-CM | POA: Diagnosis present

## 2013-08-22 DIAGNOSIS — K2270 Barrett's esophagus without dysplasia: Secondary | ICD-10-CM

## 2013-08-22 DIAGNOSIS — R9389 Abnormal findings on diagnostic imaging of other specified body structures: Secondary | ICD-10-CM

## 2013-08-22 DIAGNOSIS — C7931 Secondary malignant neoplasm of brain: Secondary | ICD-10-CM

## 2013-08-22 DIAGNOSIS — J4489 Other specified chronic obstructive pulmonary disease: Secondary | ICD-10-CM | POA: Diagnosis present

## 2013-08-22 DIAGNOSIS — E440 Moderate protein-calorie malnutrition: Secondary | ICD-10-CM | POA: Diagnosis present

## 2013-08-22 DIAGNOSIS — G8929 Other chronic pain: Secondary | ICD-10-CM

## 2013-08-22 HISTORY — DX: Malignant neoplasm of middle lobe, bronchus or lung: C34.2

## 2013-08-22 HISTORY — DX: Malignant neoplasm of unspecified part of unspecified bronchus or lung: C34.90

## 2013-08-22 HISTORY — DX: Neoplasm related pain (acute) (chronic): G89.3

## 2013-08-22 LAB — CULTURE, BLOOD (ROUTINE X 2)
CULTURE: NO GROWTH
Culture: NO GROWTH

## 2013-08-22 LAB — CBC WITH DIFFERENTIAL/PLATELET
BASOS ABS: 0.1 10*3/uL (ref 0.0–0.1)
Basophils Relative: 0 % (ref 0–1)
EOS PCT: 10 % — AB (ref 0–5)
Eosinophils Absolute: 2 10*3/uL — ABNORMAL HIGH (ref 0.0–0.7)
HCT: 42 % (ref 39.0–52.0)
Hemoglobin: 13.3 g/dL (ref 13.0–17.0)
LYMPHS PCT: 6 % — AB (ref 12–46)
Lymphs Abs: 1.2 10*3/uL (ref 0.7–4.0)
MCH: 28.7 pg (ref 26.0–34.0)
MCHC: 31.7 g/dL (ref 30.0–36.0)
MCV: 90.7 fL (ref 78.0–100.0)
Monocytes Absolute: 1.1 10*3/uL — ABNORMAL HIGH (ref 0.1–1.0)
Monocytes Relative: 5 % (ref 3–12)
NEUTROS ABS: 15.9 10*3/uL — AB (ref 1.7–7.7)
Neutrophils Relative %: 79 % — ABNORMAL HIGH (ref 43–77)
PLATELETS: 427 10*3/uL — AB (ref 150–400)
RBC: 4.63 MIL/uL (ref 4.22–5.81)
RDW: 16.5 % — AB (ref 11.5–15.5)
WBC: 20.2 10*3/uL — ABNORMAL HIGH (ref 4.0–10.5)

## 2013-08-22 LAB — COMPREHENSIVE METABOLIC PANEL
ALBUMIN: 4.2 g/dL (ref 3.5–5.2)
ALT: 21 U/L (ref 0–53)
AST: 13 U/L (ref 0–37)
Alkaline Phosphatase: 201 U/L — ABNORMAL HIGH (ref 39–117)
BUN: 11 mg/dL (ref 6–23)
CO2: 25 meq/L (ref 19–32)
Calcium: 10.4 mg/dL (ref 8.4–10.5)
Chloride: 97 mEq/L (ref 96–112)
Creatinine, Ser: 0.67 mg/dL (ref 0.50–1.35)
GFR calc Af Amer: >90 mL/min (ref >90)
Glucose, Bld: 128 mg/dL — ABNORMAL HIGH (ref 70–99)
Potassium: 4.5 mEq/L (ref 3.7–5.3)
Sodium: 139 mEq/L (ref 137–147)
Total Bilirubin: 0.4 mg/dL (ref 0.3–1.2)
Total Protein: 8.9 g/dL — ABNORMAL HIGH (ref 6.0–8.3)

## 2013-08-22 LAB — TROPONIN I: Troponin I: <0.3 ng/mL (ref <0.30)

## 2013-08-22 MED ORDER — ONDANSETRON HCL 4 MG/2ML IJ SOLN: 4.0000 mg | Freq: Four times a day (QID) | INTRAMUSCULAR | Status: DC | PRN

## 2013-08-22 MED ORDER — CYCLOBENZAPRINE HCL 10 MG PO TABS
10.0000 mg | ORAL_TABLET | Freq: Three times a day (TID) | ORAL | Status: DC | PRN
  Administered 2013-08-23: 10 mg via ORAL
  Filled 2013-08-22: qty 1

## 2013-08-22 MED ORDER — HYDROMORPHONE 0.3 MG/ML IV SOLN
INTRAVENOUS | Status: DC
  Administered 2013-08-23 (×2): via INTRAVENOUS
  Administered 2013-08-23: 6.23 mg via INTRAVENOUS
  Administered 2013-08-23: 3.49 mg via INTRAVENOUS
  Administered 2013-08-23 (×2): via INTRAVENOUS
  Filled 2013-08-22 (×4): qty 25

## 2013-08-22 MED ORDER — HYDROMORPHONE HCL PF 2 MG/ML IJ SOLN
2.0000 mg | Freq: Once | INTRAMUSCULAR | Status: AC
Start: 2013-08-22 — End: 2013-08-22
  Administered 2013-08-22: 2 mg via INTRAVENOUS
  Filled 2013-08-22: qty 1

## 2013-08-22 MED ORDER — DIPHENHYDRAMINE HCL 12.5 MG/5ML PO ELIX: 12.5000 mg | ORAL_SOLUTION | Freq: Four times a day (QID) | ORAL | Status: DC | PRN

## 2013-08-22 MED ORDER — PANTOPRAZOLE SODIUM 40 MG PO TBEC
40.0000 mg | DELAYED_RELEASE_TABLET | Freq: Every day | ORAL | Status: DC
  Administered 2013-08-22 – 2013-08-29 (×8): 40 mg via ORAL
  Filled 2013-08-22 (×8): qty 1

## 2013-08-22 MED ORDER — DIPHENHYDRAMINE HCL 50 MG/ML IJ SOLN: 12.5000 mg | Freq: Four times a day (QID) | INTRAMUSCULAR | Status: DC | PRN

## 2013-08-22 MED ORDER — HYDROMORPHONE 0.3 MG/ML IV SOLN
INTRAVENOUS | Status: DC
  Administered 2013-08-22: 21:00:00 via INTRAVENOUS
  Filled 2013-08-22: qty 25

## 2013-08-22 MED ORDER — HYDROMORPHONE HCL PF 2 MG/ML IJ SOLN
2.0000 mg | Freq: Once | INTRAMUSCULAR | Status: AC
  Administered 2013-08-22: 2 mg via INTRAVENOUS
  Filled 2013-08-22: qty 1

## 2013-08-22 MED ORDER — FENTANYL 100 MCG/HR TD PT72: 100.0000 ug | MEDICATED_PATCH | TRANSDERMAL | Status: DC

## 2013-08-22 MED ORDER — SODIUM CHLORIDE 0.9 % IJ SOLN: 9.0000 mL | INTRAMUSCULAR | Status: DC | PRN

## 2013-08-22 MED ORDER — DEXAMETHASONE 4 MG PO TABS
4.0000 mg | ORAL_TABLET | Freq: Three times a day (TID) | ORAL | Status: DC
  Administered 2013-08-22 – 2013-08-29 (×19): 4 mg via ORAL
  Filled 2013-08-22 (×23): qty 1

## 2013-08-22 MED ORDER — OXYCODONE HCL ER 15 MG PO T12A
15.0000 mg | EXTENDED_RELEASE_TABLET | Freq: Two times a day (BID) | ORAL | Status: DC
  Administered 2013-08-22: 15 mg via ORAL
  Filled 2013-08-22: qty 1

## 2013-08-22 MED ORDER — HEPARIN SODIUM (PORCINE) 5000 UNIT/ML IJ SOLN
5000.0000 [IU] | Freq: Three times a day (TID) | INTRAMUSCULAR | Status: AC
  Administered 2013-08-22 – 2013-08-25 (×9): 5000 [IU] via SUBCUTANEOUS
  Filled 2013-08-22 (×13): qty 1

## 2013-08-22 MED ORDER — SODIUM CHLORIDE 0.9 % IJ SOLN
3.0000 mL | Freq: Two times a day (BID) | INTRAMUSCULAR | Status: DC
  Administered 2013-08-24 – 2013-08-28 (×4): 3 mL via INTRAVENOUS

## 2013-08-22 MED ORDER — OXYCODONE-ACETAMINOPHEN 5-325 MG PO TABS
2.0000 | ORAL_TABLET | Freq: Once | ORAL | Status: AC
  Administered 2013-08-22: 2 via ORAL
  Filled 2013-08-22: qty 2

## 2013-08-22 MED ORDER — NALOXONE HCL 0.4 MG/ML IJ SOLN: 0.4000 mg | INTRAMUSCULAR | Status: DC | PRN

## 2013-08-22 MED ORDER — QUETIAPINE FUMARATE 300 MG PO TABS
600.0000 mg | ORAL_TABLET | Freq: Every day | ORAL | Status: DC
  Administered 2013-08-22 – 2013-08-28 (×6): 600 mg via ORAL
  Filled 2013-08-22 (×8): qty 2

## 2013-08-22 MED ORDER — HYDROMORPHONE HCL PF 1 MG/ML IJ SOLN
1.0000 mg | Freq: Once | INTRAMUSCULAR | Status: AC
  Administered 2013-08-22: 1 mg via INTRAVENOUS
  Filled 2013-08-22: qty 1

## 2013-08-22 MED ORDER — DOCUSATE SODIUM 100 MG PO CAPS
100.0000 mg | ORAL_CAPSULE | Freq: Two times a day (BID) | ORAL | Status: DC
  Administered 2013-08-22 – 2013-08-23 (×2): 100 mg via ORAL
  Filled 2013-08-22 (×3): qty 1

## 2013-08-22 MED ORDER — PREDNISONE 10 MG PO TABS
30.0000 mg | ORAL_TABLET | Freq: Every day | ORAL | Status: DC
  Filled 2013-08-22: qty 1

## 2013-08-22 NOTE — ED Provider Notes (Signed)
Medical screening examination/treatment/procedure(s) were conducted as a shared visit with non-physician practitioner(s) and myself.  I personally evaluated the patient during the encounter.   EKG Interpretation   Date/Time:  Monday August 22 2013 15:16:08 EDT Ventricular Rate:  112 PR Interval:  127 QRS Duration: 74 QT Interval:  311 QTC Calculation: 424 R Axis:   50 Text Interpretation:  Sinus tachycardia Probable left atrial enlargement  Baseline wander in lead(s) V1 V2 Otherwise normal ECG Confirmed by [facility]   MD, MICHEAL ([id number]) on 08/22/2013 3:46:53 PM      Pt with chronic back pain from known vertebral mets.  Pt reports acute worsening of known pain.  He has taken his OxyContin of 15 mg adn also percocet for breakthrough pain, but it is not being controlled and he gets heart palpitations and gets diaphoretic when pain gets more intense.  No prior h/o MI, CAD, has known COPD, denies coughing.  No nausea.  Pt denies feeling lightheaded, can't get comfortable.    Lungs clear, heart rate, regular, tachycardic, no murmurs.  I doubt ACS.  Will get troponin and ECG which shows no ischemic changes, and give IV analgesics for pain and monitor for improvement in symptoms.     7:12 PM Pt felt only transiently improved after first dose of IV analgesics, wore off quickly.  Pt was then given another 1 mg.  Finally another 2 mg along with usual home dose of OxyContin and pt reports pain down to 8/10 but wearing off again and going back up to a 9/10.  I spoke to Dr. Beryle Beams as pt still has not actually seen Dr. Earlie Server yet and he recommends admission to hospitalist and be placed on IV morphine drip to control pain and to help determine what kind of oral dosing he requires.  He reports hospitalist can consult him formally for pain control if needed.   Saddie Benders. Dorna Mai, MD 08/22/13 [phone]
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
## 2013-08-22 NOTE — ED Notes (Signed)
Pt has back cancer.  Is under treatments and was supposed to have visit today.  However pain is severe and home meds not relieving it.

## 2013-08-22 NOTE — Consult Note (Signed)
Medical Oncology  Note:  48 year old smoker who presented with a 6 month history of progressive back pain. He was found to have a 2.3x2.8x2.3 cm  right middle lobe lung mass with associated mediastinal and hilar adenopathy and extensive bone mets with extraosseous tumor up against, but not yet compressing, the spinal cord at multiple levels. A biopsy of the left iliac bone done 08/17/13 positive for adenocarcinoma consistent with a lung primary. He was started on palliative spinal radiotherapy by Dr Valere Dross and has had 2 fractions so far. He is on fentanyl transderm 100 mcg every 3 days, oxycontin 15 mg BID, and percocet 5/325 Q 4 hours prn but still not getting relief. He received  A total of 5 mg of IV dilaudid in the ED before pain subsided. He is admitted for pain control while he completes radiation treatment.  He denies any focal motor weakness; no loss of bladder or bowel control.    Vitals: Filed Vitals:   08/22/13 2316  BP: 128/77  Pulse: 86  Temp:   Resp: 19   Wt Readings from Last 3 Encounters:  08/22/13 198 lb 13.7 oz (90.2 kg)  08/16/13 203 lb 7.8 oz (92.3 kg)  08/15/13 205 lb (92.987 kg)     PHYSICAL EXAM:  General alert, oriented, uncomfortable Head:WNL Eyes:WNL Throat:no exudate or erythema, no mass Neck:full ROM Lymph Nodes:no cervical, supraclavicular or axillary adenopathy Lungs:clear to A&P Breasts:  Cardiac:regular rhythm, no murmur Abdominal: soft, distended, non tender, no mass or organomegaly Extremities:no edema, no calf tenderness Vascular:  No carotid bruits, no cyanosis Neurologic: cranial nerves grossly normal; trace motor weakness right hip flexors; hyper-reflexia at right knee 3+ but no clonus; RLE & bilateral upper extremity strength 5/5.  DTRs 2+ except left knee 3+ Skin: no rash  Labs:   Recent Labs  08/22/13 1537  WBC 20.2*  HGB 13.3  HCT 42.0  PLT 427*    Recent Labs  08/22/13 1537  NA 139  K 4.5  CL 97  CO2 25  GLUCOSE 128*   BUN 11  CREATININE 0.67  CALCIUM 10.4      Images Studies/Results:   Dg Chest 2 View  08/22/2013   CLINICAL DATA:  Back pain for several months, smoker  EXAM: CHEST  2 VIEW  COMPARISON:  08/16/2013  FINDINGS: Normal heart size, mediastinal contours, and pulmonary vascularity.  Bibasilar atelectasis.  Lungs otherwise clear.  No pleural effusion or pneumothorax.  Bones unremarkable.  IMPRESSION: Bibasilar atelectasis.   Electronically Signed   By: Lavonia Dana M.D.   On: 08/22/2013 15:57     Patient Active Problem List   Diagnosis Date Noted  . Metastatic cancer to spine 08/22/2013  . Adenocarcinoma of lung 08/22/2013  . Metastatic cancer 08/16/2013  . Tachycardia 08/16/2013  . COPD (chronic obstructive pulmonary disease) 08/16/2013  . Lung mass 08/15/2013  . 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 Chronic back pain 08/07/2013  . Impaired fasting glucose 08/06/2013  . Bronchospasm 08/06/2013  . Drug overdose 08/05/2013  . Hypokalemia 08/05/2013  . Hyperglycemia 08/05/2013  . Normocytic anemia 08/05/2013  . Abnormal chest x-ray 07/24/2013  . Anemia 04/15/2013  . Current smoker 09/04/2011  . ADVERSE DRUG REACTION 01/31/2009  . CONDYLOMA ACUMINATUM 12/18/2008  . HEMORRHOIDS, INTERNAL 12/18/2008  . ABDOMINAL INCISIONAL HERNIA 12/18/2008  . PAROXYSMAL TACHYCARDIA 10/25/2008  . HOARSENESS 10/25/2008  . BARRETTS ESOPHAGUS 08/30/2008  . HYPERTRIGLYCERIDEMIA 10/12/2007  . PLANTAR FASCIITIS, BILATERAL 10/12/2007  . Dysmetabolic syndrome X 47/65/4650  .  BIPOLAR DISORDER UNSPECIFIED 04/13/2007  . GERD 04/13/2007  . VENTRAL HERNIA 04/13/2007    Assessment and Plan:  1. 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 Extensive, symptomatic bone lesions with impending cord compromise 3. Uncontrolled pain due to #2.  Recommend: I have modified his dilaudid PCA pump so he gets a basal rate of 1 mg/hour with up to 10 mg additional bolus dosing  Over a four hour  interval with a 10 minute lockout.  I am stopping the low dose oxycontin - it is best to work with just one long acting narcotic - can make further dose adjustments in the fentanyl  I am starting him on decadron 4 mg TID for anti-inflammatory action around his spinal cord while he is being radiated - this should also help with pain control  Continue Radiation Rxs  Dr Julien Nordmann will follow the patient and offer him chemotherapy when he completes RT Note: processing of iliac bone biopsy does not allow for assessment of molecular markers such as EGFR expression or ALK - he may need another biopsy.  I will leave this decision to Dr Julien Nordmann.     Lezlee Gills M 08/22/2013, 11:31 PM

## 2013-08-22 NOTE — Telephone Encounter (Signed)
Spoke with pt's mother regarding appt 08/25/13 [place] at 2:30 arrive at 2:15.  She verbalized understanding of appt time and place
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
## 2013-08-23 ENCOUNTER — Ambulatory Visit
Admit: 2013-08-23 | Discharge: 2013-08-23 | Disposition: A | Discharge status: Home / Self Care | Payer: Medicare Other | Attending: Radiation Oncology | Admitting: Radiation Oncology

## 2013-08-23 ENCOUNTER — Ambulatory Visit (HOSPITAL_COMMUNITY): Payer: Medicare Other

## 2013-08-23 ENCOUNTER — Encounter (HOSPITAL_COMMUNITY): Payer: Medicare Other

## 2013-08-23 DIAGNOSIS — G893 Neoplasm related pain (acute) (chronic): Secondary | ICD-10-CM

## 2013-08-23 DIAGNOSIS — C801 Malignant (primary) neoplasm, unspecified: Secondary | ICD-10-CM

## 2013-08-23 DIAGNOSIS — Z515 Encounter for palliative care: Secondary | ICD-10-CM

## 2013-08-23 DIAGNOSIS — C7951 Secondary malignant neoplasm of bone: Secondary | ICD-10-CM

## 2013-08-23 DIAGNOSIS — E440 Moderate protein-calorie malnutrition: Secondary | ICD-10-CM | POA: Insufficient documentation

## 2013-08-23 DIAGNOSIS — D72829 Elevated white blood cell count, unspecified: Secondary | ICD-10-CM

## 2013-08-23 MED ORDER — HYDROMORPHONE HCL PF 2 MG/ML IJ SOLN
2.0000 mg | Freq: Once | INTRAMUSCULAR | Status: AC
  Administered 2013-08-23: 2 mg via INTRAVENOUS

## 2013-08-23 MED ORDER — CYCLOBENZAPRINE HCL 10 MG PO TABS
10.0000 mg | ORAL_TABLET | Freq: Three times a day (TID) | ORAL | Status: DC
  Administered 2013-08-23 – 2013-08-29 (×16): 10 mg via ORAL
  Filled 2013-08-23 (×20): qty 1

## 2013-08-23 MED ORDER — SENNOSIDES-DOCUSATE SODIUM 8.6-50 MG PO TABS: 2.0000 | ORAL_TABLET | Freq: Every evening | ORAL | Status: DC | PRN

## 2013-08-23 MED ORDER — HYDROMORPHONE 0.3 MG/ML IV SOLN
INTRAVENOUS | Status: DC
  Administered 2013-08-23 (×2): via INTRAVENOUS
  Administered 2013-08-23: 6.63 mg via INTRAVENOUS
  Administered 2013-08-24: 06:00:00 via INTRAVENOUS
  Administered 2013-08-24: 0.3 mg via INTRAVENOUS
  Administered 2013-08-24: 5.6 mg via INTRAVENOUS
  Administered 2013-08-24: 5.18 mg via INTRAVENOUS
  Administered 2013-08-24: 10.88 mg via INTRAVENOUS
  Administered 2013-08-24: 02:00:00 via INTRAVENOUS
  Administered 2013-08-24: 8.92 mg via INTRAVENOUS
  Administered 2013-08-24: 20:00:00 via INTRAVENOUS
  Administered 2013-08-24: 0.3 mg via INTRAVENOUS
  Administered 2013-08-24: 14.08 mg via INTRAVENOUS
  Administered 2013-08-25: 6.57 mg via INTRAVENOUS
  Administered 2013-08-25: 6.56 mg via INTRAVENOUS
  Administered 2013-08-25: 6.92 mg via INTRAVENOUS
  Administered 2013-08-25: 20:00:00 via INTRAVENOUS
  Administered 2013-08-25: 11.2 mg via INTRAVENOUS
  Administered 2013-08-25: 0.3 mg via INTRAVENOUS
  Administered 2013-08-25: 02:00:00 via INTRAVENOUS
  Administered 2013-08-25: 6.58 mg via INTRAVENOUS
  Administered 2013-08-25: 06:00:00 via INTRAVENOUS
  Administered 2013-08-26: 5.75 mg via INTRAVENOUS
  Administered 2013-08-26 (×2): via INTRAVENOUS
  Administered 2013-08-26: 6.36 mg via INTRAVENOUS
  Administered 2013-08-26: 7.6 mg via INTRAVENOUS
  Filled 2013-08-23 (×17): qty 25

## 2013-08-23 MED ORDER — HYDROMORPHONE BOLUS VIA INFUSION: 2.0000 mg | Freq: Once | INTRAVENOUS | Status: DC

## 2013-08-23 NOTE — Progress Notes (Signed)
Chaplain inquired about providing pastoral care to patient. Chaplain consulted with the patient's nurse and was advised that the patient receiving radiation.  08/23/13 1500  Clinical Encounter Type  Visited With Health care provider  Visit Type Initial

## 2013-08-23 NOTE — Progress Notes (Signed)
Weekly Management Note:  Site: Sacral spine Current Dose:  1200  cGy Projected Dose: 2000  cGy  Narrative: The patient is seen today for routine under treatment assessment. CBCT/MVCT images/port films were reviewed. The chart was reviewed.   He was read mid to the hospital yesterday for severe low back/sacral pain. He is more comfortable today. He has metastatic adenocarcinoma from a lung primary. Unfortunately, his tissue is not sent for markers, and he may require a repeat biopsy per Dr. Murriel Hopper. He'll be with Dr. Julien Nordmann for discussion of systemic therapy after completion of radiation therapy. He'll finish his treatment this Thursday.  Physical Examination: There were no vitals filed for this visit..  Weight:  . No change.  Impression: Tolerating radiation therapy well.  Plan: Continue radiation therapy as planned.

## 2013-08-23 NOTE — Progress Notes (Signed)
[facility] PROGRESS NOTE  [name] [name] VEL:[id number] DOB: 1965/09/01 DOA: 08/22/2013  PCP: [name], MD  Brief HPI: [name] [name] is a 48 y.o. male who presented to the ED with slowly worsening chronic back pain. Unfortunately patients back pain is likely due to his extensive vertebral metastatic disease (adenocarcinoma, lung primary). He was diagnosed recently. He was admitted for pain control.  Past medical history:  Past Medical History  Diagnosis Date  . Depression     bipolar  . Hyperlipidemia   . GERD (gastroesophageal reflux disease)     barrett's  . Bipolar 1 disorder   . Pneumonia   . Impaired fasting glucose 08/06/2013  . Normocytic anemia 08/05/2013  . Drug overdose 08/05/2013  . Cancer   . Lung cancer, middle lobe 08/22/2013  . Primary lung cancer with metastasis from lung to other site 08/22/2013  . Cancer associated pain 08/22/2013    Consultants: Onc, PMT  Procedures: Radiation treatments daily  Antibiotics: None  Subjective: Patient states lower back pain is about 7/10. It is much better than yesterday but still not comfortable. Denies other symptoms.  Objective: Vital Signs  Filed Vitals:   08/23/13 0421 08/23/13 0519 08/23/13 0624 08/23/13 0956  BP:  122/84    Pulse:  109    Temp:  97.6 F (36.4 C)    TempSrc:  Oral    Resp: 13 13 14 20   Height:      Weight:      SpO2: 95% 94% 95% 95%    Intake/Output Summary (Last 24 hours) at 08/23/13 1137 Last data filed at 08/23/13 0700  Gross per 24 hour  Intake  272.4 ml  Output    395 ml  Net -122.6 ml   Filed Weights   08/22/13 2059  Weight: 90.2 kg (198 lb 13.7 oz)    General appearance: alert, cooperative, appears stated age and no distress Head: Normocephalic, without obvious abnormality, atraumatic Resp: clear to auscultation bilaterally Cardio: regular rate and rhythm, S1, S2 normal, no murmur, click, rub or gallop GI: soft, non-tender; bowel sounds normal; no masses,  no  organomegaly Pulses: 2+ and symmetric Neurologic: Slightly weak in LLE but able to lift off bed. No other focal deficits.  Lab Results:  Basic Metabolic Panel:  Recent Labs Lab 08/17/13 0345 08/22/13 1537  NA 137 139  K 3.6* 4.5  CL 98 97  CO2 26 25  GLUCOSE 107* 128*  BUN 10 11  CREATININE 0.68 0.67  CALCIUM 9.0 10.4   Liver Function Tests:  Recent Labs Lab 08/22/13 1537  AST 13  ALT 21  ALKPHOS 201*  BILITOT 0.4  PROT 8.9*  ALBUMIN 4.2   CBC:  Recent Labs Lab 08/17/13 0345 08/22/13 1537  WBC 13.4* 20.2*  NEUTROABS  --  15.9*  HGB 10.4* 13.3  HCT 33.8* 42.0  MCV 91.4 90.7  PLT 307 427*   Cardiac Enzymes:  Recent Labs Lab 08/22/13 1537  TROPONINI <0.30     Recent Results (from the past 240 hour(s))  CULTURE, BLOOD (ROUTINE X 2)     Status: None   Collection Time    08/16/13 11:35 AM      Result Value Ref Range Status   Specimen Description BLOOD LEFT ARM   Final   Special Requests BOTTLES DRAWN AEROBIC ONLY 4CC   Final   Culture  Setup Time     Final   Value: 08/16/2013 14:00     Performed at [facility]   Culture     Final   Value: NO GROWTH 5 DAYS     Performed at [facility]   Report Status 08/22/2013 FINAL   Final  CULTURE, BLOOD (ROUTINE X 2)     Status: None   Collection Time    08/16/13 11:40 AM      Result Value Ref Range Status   Specimen Description BLOOD LEFT ARM   Final   Special Requests BOTTLES DRAWN AEROBIC AND ANAEROBIC [facility] EACH   Final   Culture  Setup Time     Final   Value: 08/16/2013 14:00     Performed at [facility]   Culture     Final   Value: NO GROWTH 5 DAYS     Performed at [facility]   Report Status 08/22/2013 FINAL   Final      Studies/Results: Dg Chest 2 View  08/22/2013   CLINICAL DATA:  Back pain for several months, smoker  EXAM: CHEST  2 VIEW  COMPARISON:  08/16/2013  FINDINGS: Normal heart size, mediastinal contours, and pulmonary vascularity.  Bibasilar  atelectasis.  Lungs otherwise clear.  No pleural effusion or pneumothorax.  Bones unremarkable.  IMPRESSION: Bibasilar atelectasis.   Electronically Signed   By: Lavonia Dana M.D.   On: 08/22/2013 15:57    Medications:  Scheduled: . dexamethasone  4 mg Oral 3 times per day  . docusate sodium  100 mg Oral BID  . [START ON 08/24/2013] fentaNYL  100 mcg Transdermal Q72H  . heparin  5,000 Units Subcutaneous 3 times per day  . HYDROmorphone PCA 0.3 mg/mL   Intravenous 6 times per day  . pantoprazole  40 mg Oral Daily  . QUEtiapine  600 mg Oral QHS  . 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 Patient may benefit from Palliative care input for pain management. Will consult. He is also on Fentanyl Patch. He was on Oxy IR for breakthrough pain.  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 Definitive management after radiation is completed. Also on steroids.  Leukocytosis Most likely due to steroids. No infection identified.   Code Status: Full Code  DVT Prophylaxis: Heparin    Family Communication: Discussed with patient  Disposition Plan: Home when pain is better controlled and on adequate pain management.    LOS: 1 day   [place] [facility] Pager [phone] 08/23/2013, 11:37 AM  If 8PM-8AM, please contact night-coverage at [email or web address], password [facility]

## 2013-08-23 NOTE — Consult Note (Signed)
I have reviewed and discussed the care of this patient in detail with the nurse practitioner including pertinent patient records, physical exam findings and data. I agree with details of this encounter.  

## 2013-08-23 NOTE — Care Management Note (Addendum)
    Page 1 of 1   08/29/2013     1:34:24 PM   CARE MANAGEMENT NOTE 08/29/2013  Patient:  [name] [name], [name] [name]   Account Number:  [id number]  Date Initiated:  08/23/2013  Documentation initiated by:  Dessa Phi  Subjective/Objective Assessment:   48 Y/O M ADMITTED W/METASTATIC ADENO CA LUNG CA W/METS BONE.READMIT 3/10-3/14.     Action/Plan:   FROM HOME.HAS PCP.PHARMACY.   Anticipated DC Date:  08/29/2013   Anticipated DC Plan:  [place]  CM consult      Choice offered to / List presented to:             Status of service:  Completed, signed off Medicare Important Message given?   (If response is "NO", the following Medicare IM given date fields will be blank) Date Medicare IM given:   Date Additional Medicare IM given:    Discharge Disposition:  HOME/SELF CARE  Per UR Regulation:  Reviewed for med. necessity/level of care/duration of stay  If discussed at [place] of Stay Meetings, dates discussed:    Comments:  08/29/13 Eliakim Tendler RN,BSN NCM [place] D/C HOME.  08/25/13 Jeslin Bazinet RN,BSN NCM [place] @ HOME.NO ANTICIPATED D/C NEEDS.  08/23/13 Sway Guttierrez RN,BSN NCM [phone] NO ANTICIPATED D/C NEEDS.
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
## 2013-08-24 ENCOUNTER — Ambulatory Visit: Payer: Medicare Other

## 2013-08-24 ENCOUNTER — Ambulatory Visit
Admit: 2013-08-24 | Discharge: 2013-08-24 | Disposition: A | Discharge status: Home / Self Care | Payer: Medicare Other | Attending: Radiation Oncology | Admitting: Radiation Oncology

## 2013-08-24 DIAGNOSIS — K2270 Barrett's esophagus without dysplasia: Secondary | ICD-10-CM

## 2013-08-24 DIAGNOSIS — M549 Dorsalgia, unspecified: Secondary | ICD-10-CM

## 2013-08-24 DIAGNOSIS — D649 Anemia, unspecified: Secondary | ICD-10-CM

## 2013-08-24 DIAGNOSIS — G8929 Other chronic pain: Secondary | ICD-10-CM

## 2013-08-24 DIAGNOSIS — J449 Chronic obstructive pulmonary disease, unspecified: Secondary | ICD-10-CM

## 2013-08-24 DIAGNOSIS — F319 Bipolar disorder, unspecified: Secondary | ICD-10-CM

## 2013-08-24 LAB — BASIC METABOLIC PANEL
BUN: 13 mg/dL (ref 6–23)
CALCIUM: 9.5 mg/dL (ref 8.4–10.5)
CO2: 26 mEq/L (ref 19–32)
Chloride: 96 mEq/L (ref 96–112)
Creatinine, Ser: 0.51 mg/dL (ref 0.50–1.35)
GLUCOSE: 139 mg/dL — AB (ref 70–99)
POTASSIUM: 4.3 meq/L (ref 3.7–5.3)
Sodium: 135 mEq/L — ABNORMAL LOW (ref 137–147)

## 2013-08-24 LAB — CBC
HCT: 37 % — ABNORMAL LOW (ref 39.0–52.0)
HEMOGLOBIN: 11.4 g/dL — AB (ref 13.0–17.0)
MCH: 27.9 pg (ref 26.0–34.0)
MCHC: 30.8 g/dL (ref 30.0–36.0)
MCV: 90.7 fL (ref 78.0–100.0)
Platelets: 306 10*3/uL (ref 150–400)
RBC: 4.08 MIL/uL — ABNORMAL LOW (ref 4.22–5.81)
RDW: 16.2 % — ABNORMAL HIGH (ref 11.5–15.5)
WBC: 14.3 10*3/uL — ABNORMAL HIGH (ref 4.0–10.5)

## 2013-08-24 MED ORDER — ONDANSETRON HCL 4 MG/2ML IJ SOLN
4.0000 mg | Freq: Once | INTRAMUSCULAR | Status: AC
  Administered 2013-08-24: 4 mg via INTRAVENOUS
  Filled 2013-08-24: qty 2

## 2013-08-24 MED ORDER — PROMETHAZINE HCL 25 MG/ML IJ SOLN
12.5000 mg | Freq: Once | INTRAMUSCULAR | Status: AC
  Administered 2013-08-24: 12.5 mg via INTRAVENOUS
  Filled 2013-08-24: qty 1

## 2013-08-24 MED ORDER — BISMUTH SUBSALICYLATE 262 MG/15ML PO SUSP
30.0000 mL | ORAL | Status: DC | PRN
  Filled 2013-08-24: qty 236

## 2013-08-24 MED ORDER — NICOTINE 14 MG/24HR TD PT24
14.0000 mg | MEDICATED_PATCH | Freq: Every day | TRANSDERMAL | Status: DC
  Administered 2013-08-24 – 2013-08-29 (×6): 14 mg via TRANSDERMAL
  Filled 2013-08-24 (×6): qty 1

## 2013-08-24 MED ORDER — ONDANSETRON HCL 4 MG/2ML IJ SOLN
4.0000 mg | Freq: Four times a day (QID) | INTRAMUSCULAR | Status: DC | PRN
  Administered 2013-08-24: 4 mg via INTRAVENOUS
  Filled 2013-08-24: qty 2

## 2013-08-24 MED ORDER — HYDROMORPHONE BOLUS VIA INFUSION: 2.0000 mg | Freq: Once | INTRAVENOUS | Status: DC

## 2013-08-24 MED ORDER — CALCIUM CARBONATE ANTACID 500 MG PO CHEW
1.0000 | CHEWABLE_TABLET | Freq: Four times a day (QID) | ORAL | Status: DC | PRN
  Filled 2013-08-24: qty 1

## 2013-08-24 MED ORDER — HYDROMORPHONE HCL PF 2 MG/ML IJ SOLN
2.0000 mg | Freq: Once | INTRAMUSCULAR | Status: AC
  Administered 2013-08-24: 2 mg via INTRAVENOUS
  Filled 2013-08-24: qty 1

## 2013-08-24 NOTE — Progress Notes (Signed)
[facility] PROGRESS NOTE  [name] [name] NGE:[id number] DOB: [birth year] DOA: 08/22/2013  PCP: [name], MD  Brief HPI: [name] [name] is a 48 y.o. male who presented to the ED with slowly worsening chronic back pain. Unfortunately patients back pain is likely due to his extensive vertebral metastatic disease (adenocarcinoma, lung primary). He was diagnosed recently. He was admitted for pain control.  Past medical history:  Past Medical History  Diagnosis Date  . Depression     bipolar  . Hyperlipidemia   . GERD (gastroesophageal reflux disease)     barrett's  . Bipolar 1 disorder   . Pneumonia   . Impaired fasting glucose 08/06/2013  . Normocytic anemia 08/05/2013  . Drug overdose 08/05/2013  . Cancer   . Lung cancer, middle lobe 08/22/2013  . Primary lung cancer with metastasis from lung to other site 08/22/2013  . Cancer associated pain 08/22/2013    Consultants: Onc, PMT  Procedures: Radiation treatments daily  Antibiotics: None  Subjective: Pain now much improved at 5/10 with new PCA settings. Complains of GERD/reflux sx and requests peptobismol  Objective: Vital Signs  Filed Vitals:   08/24/13 0532 08/24/13 0802 08/24/13 1130 08/24/13 1215  BP:  128/76  133/80  Pulse:  97  77  Temp:  97.8 F (36.6 C)  98.1 F (36.7 C)  TempSrc:  Oral  Oral  Resp: 13 16 16 18   Height:      Weight:      SpO2: 97% 95%  98%    Intake/Output Summary (Last 24 hours) at 08/24/13 1326 Last data filed at 08/24/13 1100  Gross per 24 hour  Intake    600 ml  Output   1515 ml  Net   -915 ml   Filed Weights   08/22/13 2059  Weight: 90.2 kg (198 lb 13.7 oz)    General appearance: alert, cooperative, appears stated age and no distress Head: Normocephalic, without obvious abnormality, atraumatic Resp: clear to auscultation bilaterally Cardio: regular rate and rhythm, S1, S2 normal, no murmur, click, rub or gallop GI: soft, non-tender; bowel sounds normal; no masses,   no organomegaly Pulses: 2+ and symmetric Neurologic: Slightly weak in LLE but able to lift off bed. No other focal deficits.  Lab Results:  Basic Metabolic Panel:  Recent Labs Lab 08/22/13 1537 08/24/13 0520  NA 139 135*  K 4.5 4.3  CL 97 96  CO2 25 26  GLUCOSE 128* 139*  BUN 11 13  CREATININE 0.67 0.51  CALCIUM 10.4 9.5   Liver Function Tests:  Recent Labs Lab 08/22/13 1537  AST 13  ALT 21  ALKPHOS 201*  BILITOT 0.4  PROT 8.9*  ALBUMIN 4.2   CBC:  Recent Labs Lab 08/22/13 1537 08/24/13 0520  WBC 20.2* 14.3*  NEUTROABS 15.9*  --   HGB 13.3 11.4*  HCT 42.0 37.0*  MCV 90.7 90.7  PLT 427* 306   Cardiac Enzymes:  Recent Labs Lab 08/22/13 1537  TROPONINI <0.30     Recent Results (from the past 240 hour(s))  CULTURE, BLOOD (ROUTINE X 2)     Status: None   Collection Time    08/16/13 11:35 AM      Result Value Ref Range Status   Specimen Description BLOOD LEFT ARM   Final   Special Requests BOTTLES DRAWN AEROBIC ONLY 4CC   Final   Culture  Setup Time     Final   Value: 08/16/2013 14:00     Performed at  [facility]     Final   Value: NO GROWTH 5 DAYS     Performed at [facility]   Report Status 08/22/2013 FINAL   Final  CULTURE, BLOOD (ROUTINE X 2)     Status: None   Collection Time    08/16/13 11:40 AM      Result Value Ref Range Status   Specimen Description BLOOD LEFT ARM   Final   Special Requests BOTTLES DRAWN AEROBIC AND ANAEROBIC [facility] EACH   Final   Culture  Setup Time     Final   Value: 08/16/2013 14:00     Performed at [facility]   Culture     Final   Value: NO GROWTH 5 DAYS     Performed at [facility]   Report Status 08/22/2013 FINAL   Final      Studies/Results: Dg Chest 2 View  08/22/2013   CLINICAL DATA:  Back pain for several months, smoker  EXAM: CHEST  2 VIEW  COMPARISON:  08/16/2013  FINDINGS: Normal heart size, mediastinal contours, and pulmonary vascularity.  Bibasilar  atelectasis.  Lungs otherwise clear.  No pleural effusion or pneumothorax.  Bones unremarkable.  IMPRESSION: Bibasilar atelectasis.   Electronically Signed   By: Lavonia Dana M.D.   On: 08/22/2013 15:57    Medications:  Scheduled: . cyclobenzaprine  10 mg Oral TID  . dexamethasone  4 mg Oral 3 times per day  . heparin  5,000 Units Subcutaneous 3 times per day  .  HYDROmorphone (DILAUDID) injection  2 mg Intravenous Once  . HYDROmorphone PCA 0.3 mg/mL   Intravenous 6 times per day  . nicotine  14 mg Transdermal Daily  . pantoprazole  40 mg Oral Daily  . QUEtiapine  600 mg Oral QHS  . 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 Appreciate Palliative care input for pain management. Dilaudid increased with scheduled flexeril.  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 Definitive management after radiation is completed. Also on steroids. Appreciate Onc input. Pending input form Dr. Inda Merlin.  Leukocytosis Most likely due to steroids. No infection identified.   Code Status: Full Code  DVT Prophylaxis: Heparin    Family Communication: Discussed with patient  Disposition Plan: Home when pain is better controlled and on adequate pain management.    LOS: 2 days   [name] [name]  [facility] Pager [phone] 08/24/2013, 1:26 PM  If 8PM-8AM, please contact night-coverage at [email or web address], password [facility]

## 2013-08-24 NOTE — Progress Notes (Signed)
Progress Note from the Palliative Medicine Team at [facility] (0745 am)  Subjective: patient is OOB to chair, tells me feels much better, pain is at a 5-6 on scale of 10  -c/o of having to go for prolonged periods of time (20-40 min) for nursing to change syringe.  If this happens it quickly set his pain "off the scale".  He is on high doses of IV Opiods and if we can keep him comfortable at these settings without escalating it will make for a smoother transition to oral agents when ready for dc home   -discussed in detail with nursing and patient strategies to ensure consistancy   -patient is cleary telling me his pain is at a tolerable level at the present settings as long as nursing keeps the syringes changed as they empty.   This is important.  At this time patient has not had full work-up and awaits oncology input as it relates to treatment options and overall prognosis    Objective: Allergies  Allergen Reactions  . Codeine Shortness Of Breath and Nausea And Vomiting       . Morphine Shortness Of Breath and Nausea And Vomiting        Scheduled Meds: . cyclobenzaprine  10 mg Oral TID  . dexamethasone  4 mg Oral 3 times per day  . heparin  5,000 Units Subcutaneous 3 times per day  . HYDROmorphone PCA 0.3 mg/mL   Intravenous 6 times per day  . pantoprazole  40 mg Oral Daily  . QUEtiapine  600 mg Oral QHS  . sodium chloride  3 mL Intravenous Q12H   Continuous Infusions:  PRN Meds:.senna-docusate  BP 124/88  Pulse 98  Temp(Src) 98.3 F (36.8 C) (Oral)  Resp 13  Ht 5\' 9"  (1.753 m)  Wt 90.2 kg (198 lb 13.7 oz)  BMI 29.35 kg/m2  SpO2 97%   PPS: 50 %  Pain Score: presently 5/10 Pain Location lower back and legs   Intake/Output Summary (Last 24 hours) at 08/24/13 0751 Last data filed at 08/24/13 0657  Gross per 24 hour  Intake    360 ml  Output   1315 ml  Net   -955 ml      LBM: PTA   Stool Softner: Senna-s  Physical Exam:  General: Chronically ill appearing,  generally comfortable  HEENT: Mm, no exudate  Chest: CTA  CVS: tachycardic  Abdomen: soft NT +BS  Ext/Neuro BLE without edema, equal strength 5/5, no c/o numbness  Labs: CBC    Component Value Date/Time   WBC 14.3* 08/24/2013 0520   RBC 4.08* 08/24/2013 0520   HGB 11.4* 08/24/2013 0520   HCT 37.0* 08/24/2013 0520   PLT 306 08/24/2013 0520   MCV 90.7 08/24/2013 0520   MCH 27.9 08/24/2013 0520   MCHC 30.8 08/24/2013 0520   RDW 16.2* 08/24/2013 0520   LYMPHSABS 1.2 08/22/2013 1537   MONOABS 1.1* 08/22/2013 1537   EOSABS 2.0* 08/22/2013 1537   BASOSABS 0.1 08/22/2013 1537    BMET    Component Value Date/Time   NA 135* 08/24/2013 0520   K 4.3 08/24/2013 0520   CL 96 08/24/2013 0520   CO2 26 08/24/2013 0520   GLUCOSE 139* 08/24/2013 0520   BUN 13 08/24/2013 0520   CREATININE 0.51 08/24/2013 0520   CALCIUM 9.5 08/24/2013 0520   GFRNONAA >90 08/24/2013 0520   GFRAA >90 08/24/2013 0520    CMP     Component Value Date/Time   NA 135*  08/24/2013 0520   K 4.3 08/24/2013 0520   CL 96 08/24/2013 0520   CO2 26 08/24/2013 0520   GLUCOSE 139* 08/24/2013 0520   BUN 13 08/24/2013 0520   CREATININE 0.51 08/24/2013 0520   CALCIUM 9.5 08/24/2013 0520   PROT 8.9* 08/22/2013 1537   ALBUMIN 4.2 08/22/2013 1537   AST 13 08/22/2013 1537   ALT 21 08/22/2013 1537   ALKPHOS 201* 08/22/2013 1537   BILITOT 0.4 08/22/2013 1537   GFRNONAA >90 08/24/2013 0520   GFRAA >90 08/24/2013 0520    Assessment and Plan:  1. Code Status: Full code   2. Scope of Treatment:  Patient is hopeful to get further input from oncology while being hospitalized for pain control.  I spoke to Dr Lew Dawes nurse Hinton Dyer yesterday to discuss the possibility of PET scan inpatient and a visit from Dr Earlie Server while hospitalized. Insurance does not allow for PET inpatient.  It is difficult for any decision to be made (even as far as pain medication strategies) without a full understanding of the extent of his disease and treatment options.  At this  time is would be impossible for him to get to any OP consultation, we are hoping to get his pain under control.  Recommend instead of a PET (inpatient)  -CT of abdomen, chest and pelvis w/wo contrast (this would give similar diagnostic information)  -Hinton Dyer will discuss with Dr Earlie Server an inpatient Visit this week   Pain:  1.-  Settings to remain unchanged, nursing to ensure no lapse in syringe integrity - scheduled Flexeril 10 mg every 8 hrs  -Senna-s- 2 tablets qhs prn  Patient is comfortable with this plan   - discussed role of breathing, meditation, prayer    2. Disposition: Hopefully home with the ability to access OP oncology interventions   3. Psychosocial: Emotional support to both patient and his mother, both are devistated by the diagnosis.  Continued holistic support at this time.        Time In Time Out Total Time Spent with Patient Total Overall Time  0745 0825 40 min 40 min    Greater than 50%  of this time was spent counseling and coordinating care related to the above assessment and plan.  Wadie Lessen NP  Palliative Medicine Team Team Phone # [phone] Pager [phone]  Discussed with Arbie Cookey RN 1

## 2013-08-24 NOTE — Progress Notes (Signed)
Two by two gauze placed under ears and between nasal cannula for comfort.

## 2013-08-24 NOTE — Progress Notes (Signed)
He feels pain control better but still at level 7 I would increase basal rate of diluadid to 2 mg/hr. I would start oxycontin  60 mg BID: he came in on 15 BID plus fentanyl 100 mcg which was discontinued. He completes RT after 2 more doses. He was scheduled for outpatient Med Onc visit for tomorrow. I will ask Dr. Julien Nordmann to see him in the hospital if he is still here.  Dr Tommy Medal has been out of town at a meeting.

## 2013-08-24 NOTE — Progress Notes (Signed)
Pt had another episode of clear emesis with brown sediment. 100cc. On call notified. Phenergan ordered. Will continue to monitor. Taressa Rauh, Bing Neighbors, RN

## 2013-08-24 NOTE — Progress Notes (Signed)
Very little relief from phenergan. Pt still nauseous and vomiting every time he hits the PCA button. Pain rate increasing as a result. On call notified. Additional doses of zofran and phenergan given. Will continue to monitor. Kilee Hedding, Bing Neighbors, RN

## 2013-08-25 ENCOUNTER — Ambulatory Visit: Payer: Medicare Other | Admitting: Physical Therapy

## 2013-08-25 ENCOUNTER — Encounter: Payer: Self-pay | Admitting: Radiation Oncology

## 2013-08-25 ENCOUNTER — Ambulatory Visit
Admit: 2013-08-25 | Discharge: 2013-08-25 | Disposition: A | Discharge status: Home / Self Care | Payer: Medicare Other | Attending: Radiation Oncology | Admitting: Radiation Oncology

## 2013-08-25 ENCOUNTER — Ambulatory Visit: Payer: Medicare Other | Admitting: Internal Medicine

## 2013-08-25 ENCOUNTER — Inpatient Hospital Stay (HOSPITAL_COMMUNITY): Payer: Medicare Other

## 2013-08-25 MED ORDER — CALCIUM CARBONATE ANTACID 500 MG PO CHEW
500.0000 mg | CHEWABLE_TABLET | Freq: Four times a day (QID) | ORAL | Status: DC | PRN
  Administered 2013-08-28: 500 mg via ORAL
  Filled 2013-08-25: qty 1

## 2013-08-25 MED ORDER — IOHEXOL 300 MG/ML  SOLN
25.0000 mL | INTRAMUSCULAR | Status: AC
  Administered 2013-08-25: 25 mL via ORAL

## 2013-08-25 MED ORDER — LIDOCAINE 5 % EX PTCH
2.0000 | MEDICATED_PATCH | CUTANEOUS | Status: DC
  Administered 2013-08-25 – 2013-08-29 (×3): 2 via TRANSDERMAL
  Filled 2013-08-25 (×6): qty 2

## 2013-08-25 MED ORDER — ZOLEDRONIC ACID 4 MG/5ML IV CONC
4.0000 mg | Freq: Once | INTRAVENOUS | Status: AC
  Administered 2013-08-25: 4 mg via INTRAVENOUS
  Filled 2013-08-25: qty 5

## 2013-08-25 MED ORDER — HYDROMORPHONE HCL PF 2 MG/ML IJ SOLN
2.0000 mg | Freq: Once | INTRAMUSCULAR | Status: AC
  Administered 2013-08-25: 2 mg via INTRAVENOUS

## 2013-08-25 MED ORDER — HYDROMORPHONE HCL PF 2 MG/ML IJ SOLN
2.0000 mg | INTRAMUSCULAR | Status: DC | PRN
  Filled 2013-08-25: qty 1

## 2013-08-25 MED ORDER — IOHEXOL 300 MG/ML  SOLN
100.0000 mL | Freq: Once | INTRAMUSCULAR | Status: AC | PRN
  Administered 2013-08-25: 100 mL via INTRAVENOUS
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
## 2013-08-25 NOTE — Progress Notes (Signed)
[facility] PROGRESS NOTE  [name] [name] ZOX:[id number] DOB: August 15, 1965 DOA: 08/22/2013  PCP: [name], MD  Brief HPI: [name] [name] is a 48 y.o. male who presented to the ED with slowly worsening chronic back pain. Unfortunately patients back pain is likely due to his extensive vertebral metastatic disease (adenocarcinoma, lung primary). He was diagnosed recently. He was admitted for pain control.  Past medical history:  Past Medical History  Diagnosis Date  . Depression     bipolar  . Hyperlipidemia   . GERD (gastroesophageal reflux disease)     barrett's  . Bipolar 1 disorder   . Pneumonia   . Impaired fasting glucose 08/06/2013  . Normocytic anemia 08/05/2013  . Drug overdose 08/05/2013  . Cancer   . Lung cancer, middle lobe 08/22/2013  . Primary lung cancer with metastasis from lung to other site 08/22/2013  . Cancer associated pain 08/22/2013    Consultants: Onc, PMT, Pulmonary  Procedures: Radiation treatments daily  Antibiotics: None  Subjective: Reported worse pain and nausea this AM, since improved by the afternoon. No other acute events noted.  Objective: Vital Signs  Filed Vitals:   08/24/13 2340 08/25/13 0419 08/25/13 0624 08/25/13 0911  BP:   120/74   Pulse:   105   Temp:   97.5 F (36.4 C)   TempSrc:   Oral   Resp: 18 15 19 16   Height:      Weight:      SpO2: 97%  97%     Intake/Output Summary (Last 24 hours) at 08/25/13 1015 Last data filed at 08/25/13 0700  Gross per 24 hour  Intake 434.87 ml  Output    725 ml  Net -290.13 ml   Filed Weights   08/22/13 2059  Weight: 90.2 kg (198 lb 13.7 oz)    General appearance: alert, cooperative, appears stated age and no distress Head: Normocephalic, without obvious abnormality, atraumatic Resp: clear to auscultation bilaterally Cardio: regular rate and rhythm, S1, S2 normal, no murmur, click, rub or gallop GI: soft, non-tender; bowel sounds normal; no masses,  no organomegaly Pulses:  2+ and symmetric Neurologic: Slightly weak in LLE but able to lift off bed. No other focal deficits.  Lab Results:  Basic Metabolic Panel:  Recent Labs Lab 08/22/13 1537 08/24/13 0520  NA 139 135*  K 4.5 4.3  CL 97 96  CO2 25 26  GLUCOSE 128* 139*  BUN 11 13  CREATININE 0.67 0.51  CALCIUM 10.4 9.5   Liver Function Tests:  Recent Labs Lab 08/22/13 1537  AST 13  ALT 21  ALKPHOS 201*  BILITOT 0.4  PROT 8.9*  ALBUMIN 4.2   CBC:  Recent Labs Lab 08/22/13 1537 08/24/13 0520  WBC 20.2* 14.3*  NEUTROABS 15.9*  --   HGB 13.3 11.4*  HCT 42.0 37.0*  MCV 90.7 90.7  PLT 427* 306   Cardiac Enzymes:  Recent Labs Lab 08/22/13 1537  TROPONINI <0.30     Recent Results (from the past 240 hour(s))  CULTURE, BLOOD (ROUTINE X 2)     Status: None   Collection Time    08/16/13 11:35 AM      Result Value Ref Range Status   Specimen Description BLOOD LEFT ARM   Final   Special Requests BOTTLES DRAWN AEROBIC ONLY 4CC   Final   Culture  Setup Time     Final   Value: 08/16/2013 14:00     Performed at [facility]  Final   Value: NO GROWTH 5 DAYS     Performed at [facility]   Report Status 08/22/2013 FINAL   Final  CULTURE, BLOOD (ROUTINE X 2)     Status: None   Collection Time    08/16/13 11:40 AM      Result Value Ref Range Status   Specimen Description BLOOD LEFT ARM   Final   Special Requests BOTTLES DRAWN AEROBIC AND ANAEROBIC [facility] EACH   Final   Culture  Setup Time     Final   Value: 08/16/2013 14:00     Performed at [facility]   Culture     Final   Value: NO GROWTH 5 DAYS     Performed at [facility]   Report Status 08/22/2013 FINAL   Final      Studies/Results: No results found.  Medications:  Scheduled: . cyclobenzaprine  10 mg Oral TID  . dexamethasone  4 mg Oral 3 times per day  . heparin  5,000 Units Subcutaneous 3 times per day  . HYDROmorphone PCA 0.3 mg/mL   Intravenous 6 times per day  .  iohexol  25 mL Oral Q1 Hr x 2  . lidocaine  2 patch Transdermal Q24H  . nicotine  14 mg Transdermal Daily  . pantoprazole  40 mg Oral Daily  . QUEtiapine  600 mg Oral QHS  . 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 Appreciate Palliative care input for pain management. Requiring ample amounts of narcotics.   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 Definitive management after radiation is completed. Also on steroids. Appreciate Onc input. Pending input form Dr. Inda Merlin. Will order CT abd/pelvis for metastatic survey per PMT recs. CT head was done in 2/15 and CT chest w/ contrast done 3/5, so did not order these.  Leukocytosis Most likely due to steroids. No infection identified.   Code Status: Full Code  DVT Prophylaxis: Heparin    Family Communication: Discussed with patient  Disposition Plan: Home when pain is better controlled and on adequate pain management.    LOS: 3 days   CHIUOrpah Melter  [facility] Pager [phone]  08/25/2013, 10:15 AM  If 8PM-8AM, please contact night-coverage at [email or web address], password [email or web address]

## 2013-08-25 NOTE — Progress Notes (Signed)
[facility] End of Treatment Note  Name:[name] [name]  Date: 08/25/2013 JGO:[id number] DOB:January 05, 1966   Status:inpatient    CC: [name], MD  Dr. Curt Bears  REFERRING PHYSICIAN:   Dr. 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 tolerated treatment well with improvement of his pain by completion of therapy. He is felt to have an unstable pelvis.                           PLAN: Routine followup in one month. Patient instructed to call if questions or worsening complaints in interim. He'll now see Dr. Julien Nordmann for discussion of systemic therapy.

## 2013-08-25 NOTE — Progress Notes (Signed)
Progress Note from the Palliative Medicine Team at [place]:   -nursing tells me the patient had a very difficult night secondary to nausea and diarrhea  -we need further diagnositics and input from oncology in order to coordinate this mans care    -plan is for biopsy tomorrow, abdominal CT today  -pain control remains less than optimal      Objective: Allergies  Allergen Reactions  . Codeine Shortness Of Breath and Nausea And Vomiting       . Morphine Shortness Of Breath and Nausea And Vomiting        Scheduled Meds: . cyclobenzaprine  10 mg Oral TID  . dexamethasone  4 mg Oral 3 times per day  . heparin  5,000 Units Subcutaneous 3 times per day  . HYDROmorphone PCA 0.3 mg/mL   Intravenous 6 times per day  . nicotine  14 mg Transdermal Daily  . pantoprazole  40 mg Oral Daily  . QUEtiapine  600 mg Oral QHS  . sodium chloride  3 mL Intravenous Q12H   Continuous Infusions:  PRN Meds:.bismuth subsalicylate, calcium carbonate, ondansetron (ZOFRAN) IV, senna-docusate  BP 120/74  Pulse 105  Temp(Src) 97.5 F (36.4 C) (Oral)  Resp 19  Ht 5\' 9"  (1.753 m)  Wt 90.2 kg (198 lb 13.7 oz)  BMI 29.35 kg/m2  SpO2 97%   PPS: 50 %  Pain Score: presently 5/10 Pain Location lower back and legs   Intake/Output Summary (Last 24 hours) at 08/25/13 0826 Last data filed at 08/25/13 0700  Gross per 24 hour  Intake 434.87 ml  Output    775 ml  Net -340.13 ml      LBM: PTA   Stool Softner: Senna-s  Physical Exam:  General: Appears comfortable this morning, tells me his pain is HEENT: Mm, no exudate  Chest: CTA  CVS: tachycardic  Abdomen: soft NT +BS  Ext/Neuro:  BLE without edema, equal strength 5/5, no c/o numbness  Labs: CBC    Component Value Date/Time   WBC 14.3* 08/24/2013 0520   RBC 4.08* 08/24/2013 0520   HGB 11.4* 08/24/2013 0520   HCT 37.0* 08/24/2013 0520   PLT 306 08/24/2013 0520   MCV 90.7 08/24/2013 0520   MCH 27.9 08/24/2013 0520   MCHC 30.8  08/24/2013 0520   RDW 16.2* 08/24/2013 0520   LYMPHSABS 1.2 08/22/2013 1537   MONOABS 1.1* 08/22/2013 1537   EOSABS 2.0* 08/22/2013 1537   BASOSABS 0.1 08/22/2013 1537    BMET    Component Value Date/Time   NA 135* 08/24/2013 0520   K 4.3 08/24/2013 0520   CL 96 08/24/2013 0520   CO2 26 08/24/2013 0520   GLUCOSE 139* 08/24/2013 0520   BUN 13 08/24/2013 0520   CREATININE 0.51 08/24/2013 0520   CALCIUM 9.5 08/24/2013 0520   GFRNONAA >90 08/24/2013 0520   GFRAA >90 08/24/2013 0520    CMP     Component Value Date/Time   NA 135* 08/24/2013 0520   K 4.3 08/24/2013 0520   CL 96 08/24/2013 0520   CO2 26 08/24/2013 0520   GLUCOSE 139* 08/24/2013 0520   BUN 13 08/24/2013 0520   CREATININE 0.51 08/24/2013 0520   CALCIUM 9.5 08/24/2013 0520   PROT 8.9* 08/22/2013 1537   ALBUMIN 4.2 08/22/2013 1537   AST 13 08/22/2013 1537   ALT 21 08/22/2013 1537   ALKPHOS 201* 08/22/2013 1537   BILITOT 0.4 08/22/2013 1537   GFRNONAA >90 08/24/2013 0520   GFRAA >  90 08/24/2013 0520    Assessment and Plan:  1. Code Status: Full code   2. Scope of Treatment:  At this time patient is open to all available and offered medical interventions to treat his cancer diagnosis and prolong life   Pain:   1.-  Settings to remain unchanged, nursing to ensure no lapse in syringe integrity - scheduled Flexeril 10 mg every 8 hrs  -Senna-s- 2 tablets qhs prn - add Lidoderm patch Patient is comfortable with this plan   - discussed role of breathing, meditation, prayer    2. Disposition: Hopefully home with the ability to access OP oncology interventions   3. Psychosocial: Emotional support to  patient at bedside                            Continued holistic support at this time.    Time In Time Out Total Time Spent with Patient Total Overall Time  0800 0825  min min    Greater than 50%  of this time was spent counseling and coordinating care related to the above assessment and plan.  Wadie Lessen NP  Palliative Medicine  Team Team Phone # [phone] Pager [phone]  Discussed with Dr Wyline Copas 1

## 2013-08-25 NOTE — Progress Notes (Addendum)
Assumed care of patient at 23:00.  I agree with previous assessment.  Patient had a difficult night with continued pain, nausea, and frequent stools.  Was unable to take bedtime pills due to nausea and vomiting but able to take pill this am. Will continue to monitor patient. Owens Shark, Clois Montavon Cherie

## 2013-08-26 ENCOUNTER — Encounter (HOSPITAL_COMMUNITY)
Admission: EM | Disposition: A | Discharge status: Home / Self Care | Payer: Self-pay | Source: Home / Self Care | Attending: Internal Medicine

## 2013-08-26 ENCOUNTER — Inpatient Hospital Stay (HOSPITAL_COMMUNITY): Payer: Medicare Other

## 2013-08-26 ENCOUNTER — Encounter (HOSPITAL_COMMUNITY): Payer: Self-pay | Admitting: Respiratory Therapy

## 2013-08-26 DIAGNOSIS — C801 Malignant (primary) neoplasm, unspecified: Secondary | ICD-10-CM | POA: Diagnosis not present

## 2013-08-26 DIAGNOSIS — R918 Other nonspecific abnormal finding of lung field: Secondary | ICD-10-CM

## 2013-08-26 DIAGNOSIS — C342 Malignant neoplasm of middle lobe, bronchus or lung: Secondary | ICD-10-CM

## 2013-08-26 DIAGNOSIS — C3490 Malignant neoplasm of unspecified part of unspecified bronchus or lung: Secondary | ICD-10-CM | POA: Diagnosis not present

## 2013-08-26 HISTORY — PX: VIDEO BRONCHOSCOPY: SHX5072

## 2013-08-26 SURGERY — BRONCHOSCOPY, WITH FLUOROSCOPY
Anesthesia: Moderate Sedation | Laterality: Bilateral

## 2013-08-26 SURGERY — BRONCHOSCOPY, WITH FLUOROSCOPY
Anesthesia: Moderate Sedation

## 2013-08-26 MED ORDER — MIDAZOLAM HCL 10 MG/2ML IJ SOLN
INTRAMUSCULAR | Status: DC | PRN
  Administered 2013-08-26 (×3): 2 mg via INTRAVENOUS

## 2013-08-26 MED ORDER — MIDAZOLAM HCL 10 MG/2ML IJ SOLN
INTRAMUSCULAR | Status: AC
  Filled 2013-08-26: qty 4

## 2013-08-26 MED ORDER — FENTANYL CITRATE 0.05 MG/ML IJ SOLN
INTRAMUSCULAR | Status: AC
  Filled 2013-08-26: qty 4

## 2013-08-26 MED ORDER — FENTANYL CITRATE 0.05 MG/ML IJ SOLN
INTRAMUSCULAR | Status: DC | PRN
  Administered 2013-08-26 (×4): 50 ug via INTRAVENOUS

## 2013-08-26 MED ORDER — LIDOCAINE HCL 2 % EX GEL
Freq: Once | CUTANEOUS | Status: DC
  Filled 2013-08-26: qty 5

## 2013-08-26 MED ORDER — BUTAMBEN-TETRACAINE-BENZOCAINE 2-2-14 % EX AERO
1.0000 | INHALATION_SPRAY | Freq: Once | CUTANEOUS | Status: DC
  Filled 2013-08-26: qty 56

## 2013-08-26 MED ORDER — PHENYLEPHRINE HCL 0.25 % NA SOLN
NASAL | Status: DC | PRN
  Administered 2013-08-26: 2

## 2013-08-26 MED ORDER — SODIUM CHLORIDE 0.9 % IV SOLN
INTRAVENOUS | Status: DC
  Administered 2013-08-26: 15:00:00 via INTRAVENOUS

## 2013-08-26 MED ORDER — HYDROMORPHONE 0.3 MG/ML IV SOLN
INTRAVENOUS | Status: DC
  Administered 2013-08-26: 4.39 mg via INTRAVENOUS
  Administered 2013-08-26: 3.32 mg via INTRAVENOUS
  Administered 2013-08-27: 1.67 mg via INTRAVENOUS
  Administered 2013-08-27: 18:00:00 via INTRAVENOUS
  Administered 2013-08-27: 0.844 mg via INTRAVENOUS
  Administered 2013-08-27: 4.66 mg via INTRAVENOUS
  Administered 2013-08-27: 4.25 mg via INTRAVENOUS
  Filled 2013-08-26 (×5): qty 25

## 2013-08-26 MED ORDER — LIDOCAINE HCL 2 % EX GEL
CUTANEOUS | Status: DC | PRN
  Administered 2013-08-26: 2

## 2013-08-26 MED ORDER — HYDROMORPHONE 0.3 MG/ML IV SOLN
INTRAVENOUS | Status: AC
  Administered 2013-08-26: 11:00:00 via INTRAVENOUS
  Administered 2013-08-26: 3.43 mg via INTRAVENOUS
  Administered 2013-08-26: 19:00:00 via INTRAVENOUS
  Filled 2013-08-26 (×2): qty 25

## 2013-08-26 MED ORDER — PHENYLEPHRINE HCL 0.25 % NA SOLN
1.0000 | Freq: Four times a day (QID) | NASAL | Status: DC | PRN
  Filled 2013-08-26: qty 15

## 2013-08-26 MED ORDER — HYDROMORPHONE 0.3 MG/ML IV SOLN: INTRAVENOUS | Status: DC

## 2013-08-26 MED ORDER — OXYCODONE HCL ER 15 MG PO T12A
30.0000 mg | EXTENDED_RELEASE_TABLET | Freq: Two times a day (BID) | ORAL | Status: DC
  Administered 2013-08-26 – 2013-08-28 (×4): 30 mg via ORAL
  Filled 2013-08-26 (×4): qty 2

## 2013-08-26 MED ORDER — LIDOCAINE HCL 1 % IJ SOLN
INTRAMUSCULAR | Status: DC | PRN
  Administered 2013-08-26: 2 mL

## 2013-08-26 NOTE — Progress Notes (Signed)
[facility] PROGRESS NOTE  ULES MARSALA OZD:[id number] DOB: [birth year] DOA: 08/22/2013  PCP: [name], MD  Brief HPI: [name] [name] is a 48 y.o. male who presented to the ED with slowly worsening chronic back pain. Unfortunately patients back pain is likely due to his extensive vertebral metastatic disease (adenocarcinoma, lung primary). He was diagnosed recently. He was admitted for pain control.  Past medical history:  Past Medical History  Diagnosis Date  . Depression     bipolar  . Hyperlipidemia   . GERD (gastroesophageal reflux disease)     barrett's  . Bipolar 1 disorder   . Pneumonia   . Impaired fasting glucose 08/06/2013  . Normocytic anemia 08/05/2013  . Drug overdose 08/05/2013  . Cancer   . Lung cancer, middle lobe 08/22/2013  . Primary lung cancer with metastasis from lung to other site 08/22/2013  . Cancer associated pain 08/22/2013    Consultants: Onc, PMT, Pulmonary  Procedures: Radiation treatments daily  Antibiotics: None  Subjective: No acute events noted overnight. Pain controlled currently  Objective: Vital Signs  Filed Vitals:   08/25/13 2006 08/25/13 2249 08/26/13 0457 08/26/13 0839  BP:  120/67 123/73   Pulse:  103 106   Temp:  98.9 F (37.2 C) 98.2 F (36.8 C)   TempSrc:  Oral Oral   Resp: 16 18 18 12   Height:      Weight:   88 kg (194 lb 0.1 oz)   SpO2: 98% 97% 96% 98%    Intake/Output Summary (Last 24 hours) at 08/26/13 1138 Last data filed at 08/26/13 0502  Gross per 24 hour  Intake    360 ml  Output    775 ml  Net   -415 ml   Filed Weights   08/22/13 2059 08/26/13 0457  Weight: 90.2 kg (198 lb 13.7 oz) 88 kg (194 lb 0.1 oz)    General appearance: alert, cooperative, appears stated age and no distress Head: Normocephalic, without obvious abnormality, atraumatic Resp: clear to auscultation bilaterally Cardio: regular rate and rhythm, S1, S2 normal, no murmur, click, rub or gallop GI: soft, non-tender; bowel  sounds normal; no masses,  no organomegaly Pulses: 2+ and symmetric Neurologic: Slightly weak in LLE but able to lift off bed. No other focal deficits.  Lab Results:  Basic Metabolic Panel:  Recent Labs Lab 08/22/13 1537 08/24/13 0520  NA 139 135*  K 4.5 4.3  CL 97 96  CO2 25 26  GLUCOSE 128* 139*  BUN 11 13  CREATININE 0.67 0.51  CALCIUM 10.4 9.5   Liver Function Tests:  Recent Labs Lab 08/22/13 1537  AST 13  ALT 21  ALKPHOS 201*  BILITOT 0.4  PROT 8.9*  ALBUMIN 4.2   CBC:  Recent Labs Lab 08/22/13 1537 08/24/13 0520  WBC 20.2* 14.3*  NEUTROABS 15.9*  --   HGB 13.3 11.4*  HCT 42.0 37.0*  MCV 90.7 90.7  PLT 427* 306   Cardiac Enzymes:  Recent Labs Lab 08/22/13 1537  TROPONINI <0.30     Recent Results (from the past 240 hour(s))  CULTURE, BLOOD (ROUTINE X 2)     Status: None   Collection Time    08/16/13 11:40 AM      Result Value Ref Range Status   Specimen Description BLOOD LEFT ARM   Final   Special Requests BOTTLES DRAWN AEROBIC AND ANAEROBIC [facility] EACH   Final   Culture  Setup Time     Final   Value:  08/16/2013 14:00     Performed at [facility]   Culture     Final   Value: NO GROWTH 5 DAYS     Performed at [facility]   Report Status 08/22/2013 FINAL   Final      Studies/Results: Ct Abdomen Pelvis W Contrast  08/25/2013   CLINICAL DATA:  Metastatic adenocarcinoma lung.  EXAM: CT ABDOMEN AND PELVIS WITH CONTRAST  TECHNIQUE: Multidetector CT imaging of the abdomen and pelvis was performed using the standard protocol following bolus administration of intravenous contrast.  CONTRAST:  174mL OMNIPAQUE IOHEXOL 300 MG/ML  SOLN  COMPARISON:  CT CHEST W/CM dated 08/11/2013; CT L SPINE W/CM dated 08/16/2013  FINDINGS: Right lower lobe mass is again demonstrated. Metastatic lymph node is noted in the right infrahilar location (image number 3). No pericardial fluid.  No focal hepatic lesion. Gallbladder, pancreas, spleen, adrenal glands,  and kidneys are normal.  The stomach, small bowel, and cecum are normal. Appendix is normal. The colon and rectosigmoid colon are normal.  Abdominal aorta is normal caliber. No retroperitoneal or periportal lymphadenopathy.  No free fluid the pelvis. Prostate gland and bladder normal. No pelvic lymphadenopathy.  There is extensive lytic skeletal metastasis with soft tissue expansion with heavy involvement of the left iliac bone and sacrum. Additional lesions noted throughout the lumbar spine. There is encroachment spinal canal at T12 (image 25, series 2).  IMPRESSION: 1. No evidence of metastasis within the soft tissues of the abdomen or pelvis. 2. 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 3. There is encroachment central canal at T12.   Electronically Signed   By: Suzy Bouchard M.D.   On: 08/25/2013 15:30    Medications:  Scheduled: . cyclobenzaprine  10 mg Oral TID  . dexamethasone  4 mg Oral 3 times per day  . HYDROmorphone PCA 0.3 mg/mL   Intravenous 6 times per day  . lidocaine  2 patch Transdermal Q24H  . nicotine  14 mg Transdermal Daily  . pantoprazole  40 mg Oral Daily  . QUEtiapine  600 mg Oral QHS  . 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 Appreciate Palliative care input for pain management. Requiring ample amounts of  narcotics.   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 Definitive management after radiation is completed. Also on steroids. Appreciate Onc input. Pending input form Dr. Inda Merlin. Will order CT abd/pelvis for metastatic survey per PMT recs. CT head was done in 2/15 and CT chest w/ contrast done 3/5, so did not order these.  Pulm recs noted and appreciated. Plan for bronch later today to biopsy RML lesion  Leukocytosis Most likely due to steroids. No infection identified.   Code Status: Full Code  DVT Prophylaxis: Heparin    Family Communication: Discussed with patient  Disposition Plan: Home when pain is better  controlled and on adequate pain management.    LOS: 4 days   CHIUOrpah Melter  [facility] Pager [phone]  08/26/2013, 11:38 AM  If 8PM-8AM, please contact night-coverage at [email or web address], password [email or web address]

## 2013-08-26 NOTE — Op Note (Signed)
PCCM Bronchoscopy Procedure Note  The patient was informed of the risks (including but not limited to bleeding, infection, respiratory failure, lung injury, tooth/oral injury) and benefits of the procedure and gave consent, see chart.  Indication: Lung adenocarcinoma, need more tissue for molecular testing  Location: [facility] endoscopy suite  Condition pre procedure: stable  Medications for procedure: 6mg  versed IV, fentanyl 245mcg, lidocaine 1% 15 cc  Procedure description: The bronchoscope was introduced through the nose and passed to the bilateral lungs to the level of the subsegmental bronchi throughout the tracheobronchial tree.  Airway exam revealed external compression of the lateral segment of the right middle lobe.  The scope would not enter the lateral segment of the RML.    Procedures performed:  1) Transbronchial needle aspiration of the RML mass for cell block 2) transbronchial biopsies of the mass (visible on fluoro)  Specimens sent:  1) FNA sent for cytology  2) Transbronchial biopsy for histology  Condition post procedure: stable  Patient instructions: we will call you with results  Complications: none  EBL: < 10cc  Jillyn Hidden PCCM Pager: [phone] Cell: [phone] If no response, call [phone]

## 2013-08-26 NOTE — Progress Notes (Addendum)
Video bronchoscopy  Good patient tolerance  Intervention transbronchial biopsy  Intervention wang needle biopsy

## 2013-08-26 NOTE — H&P (Signed)
  [facility] PCCM  I have seen and examined Mr. Delaine today and there have been no major changes to his history and physical exam since I first met him last week and examined him yesterday and today.  We plan a flexible bronchoscopy to obtain tissue from the RML for mutation analysis as he has adenocarcinoma.  Jillyn Hidden PCCM Pager: [phone] Cell: [phone] If no response, call [phone]

## 2013-08-26 NOTE — Progress Notes (Signed)
[facility] PCCM  S: Feeling OK, pain OK  O:  Filed Vitals:   08/25/13 2006 08/25/13 2249 08/26/13 0457 08/26/13 0839  BP:  120/67 123/73   Pulse:  103 106   Temp:  98.9 F (37.2 C) 98.2 F (36.8 C)   TempSrc:  Oral Oral   Resp: 16 18 18 12   Height:      Weight:   88 kg (194 lb 0.1 oz)   SpO2: 98% 97% 96% 98%   Gen: no acute distress HEENT: NCAT, EOMi PULM: CTA B CV: RRR, no mgr, no JVD AB: BS+, soft, nontender Ext: warm, no edema, no clubbing, no cyanosis   Impression:  1) Metastatic adenocarcinoma of the lung, need to get more tissue for mutation analysis to determine optimal chemotherapy regimen 2) Presumed COPD, no PFTs 3) Back pain, on dilaudid PCA  Plan: 1) plan flexible bronchoscopy today to biopsy RML mass 2) maintain NPO 3) Will pause dilaudid PCA for procedure  Jillyn Hidden PCCM Pager: [phone] Cell: [phone] If no response, call [phone]

## 2013-08-26 NOTE — Progress Notes (Signed)
Progress Note from the Palliative Medicine Team at [facility]:    -mother and father at bedsdie  -patient is alert and oriented, tells me he is utilizing PCA less--detailed conversation/eduction regarding the importance of converting a to oral agents for pain managemtn  in anticiaption of dc home as soon as medically stable, in hopes of OP f/u with oncology    Objective: Allergies  Allergen Reactions  . Codeine Shortness Of Breath and Nausea And Vomiting       . Morphine Shortness Of Breath and Nausea And Vomiting        Scheduled Meds: . cyclobenzaprine  10 mg Oral TID  . dexamethasone  4 mg Oral 3 times per day  . HYDROmorphone PCA 0.3 mg/mL   Intravenous 6 times per day  . lidocaine  2 patch Transdermal Q24H  . nicotine  14 mg Transdermal Daily  . pantoprazole  40 mg Oral Daily  . QUEtiapine  600 mg Oral QHS  . sodium chloride  3 mL Intravenous Q12H   Continuous Infusions:  PRN Meds:.bismuth subsalicylate, calcium carbonate, HYDROmorphone (DILAUDID) injection, ondansetron (ZOFRAN) IV, senna-docusate  BP 123/73  Pulse 106  Temp(Src) 98.2 F (36.8 C) (Oral)  Resp 12  Ht 5\' 9"  (1.753 m)  Wt 88 kg (194 lb 0.1 oz)  BMI 28.64 kg/m2  SpO2 98%   PPS: 50 %  Pain Score: presently 5/10 Pain Location lower back and legs   Intake/Output Summary (Last 24 hours) at 08/26/13 0931 Last data filed at 08/26/13 0502  Gross per 24 hour  Intake    360 ml  Output    775 ml  Net   -415 ml      LBM: PTA   Stool Softner: Senna-s  Physical Exam:  General: Appears comfortable this morning, tells me his pain is HEENT: Mm, no exudate  Chest: CTA  CVS: tachycardic  Abdomen: soft NT +BS, appears out of proprotion  Ext/Neuro:  BLE without edema, equal strength 5/5, no c/o numbness  Labs: CBC    Component Value Date/Time   WBC 14.3* 08/24/2013 0520   RBC 4.08* 08/24/2013 0520   HGB 11.4* 08/24/2013 0520   HCT 37.0* 08/24/2013 0520   PLT 306 08/24/2013 0520   MCV 90.7  08/24/2013 0520   MCH 27.9 08/24/2013 0520   MCHC 30.8 08/24/2013 0520   RDW 16.2* 08/24/2013 0520   LYMPHSABS 1.2 08/22/2013 1537   MONOABS 1.1* 08/22/2013 1537   EOSABS 2.0* 08/22/2013 1537   BASOSABS 0.1 08/22/2013 1537    BMET    Component Value Date/Time   NA 135* 08/24/2013 0520   K 4.3 08/24/2013 0520   CL 96 08/24/2013 0520   CO2 26 08/24/2013 0520   GLUCOSE 139* 08/24/2013 0520   BUN 13 08/24/2013 0520   CREATININE 0.51 08/24/2013 0520   CALCIUM 9.5 08/24/2013 0520   GFRNONAA >90 08/24/2013 0520   GFRAA >90 08/24/2013 0520    CMP     Component Value Date/Time   NA 135* 08/24/2013 0520   K 4.3 08/24/2013 0520   CL 96 08/24/2013 0520   CO2 26 08/24/2013 0520   GLUCOSE 139* 08/24/2013 0520   BUN 13 08/24/2013 0520   CREATININE 0.51 08/24/2013 0520   CALCIUM 9.5 08/24/2013 0520   PROT 8.9* 08/22/2013 1537   ALBUMIN 4.2 08/22/2013 1537   AST 13 08/22/2013 1537   ALT 21 08/22/2013 1537   ALKPHOS 201* 08/22/2013 1537   BILITOT 0.4 08/22/2013 1537  GFRNONAA >90 08/24/2013 0520   GFRAA >90 08/24/2013 0520    Assessment and Plan:  1. Code Status: Full code   2. Scope of Treatment:  At this time patient is open to all available and offered medical interventions to treat his cancer diagnosis and prolong life   Pain:   1.-  Plan discussed with Pharmacy  Tonight at 2000: Start OxyContin 30 mg every 12 hrs po prn Stop basal rate on Dilaudid pump and increase lockout interval to every 15 min Continue - scheduled Flexeril 10 mg every 8 hrs  - Lidoderm patch  Patient is comfortable with this plan  - discussed role of breathing, meditation, prayer and relaxation (he utilizes games on phone to relax)   2. Disposition: Hopefully home with the ability to access OP oncology interventions as soon as pain controled on oralagents  3. Psychosocial: Emotional support to  patient and his parents at bedside.  They are willing to help him in any way possible.  Continued holistic support at this  time.    Time In Time Out Total Time Spent with Patient Total Overall Time  0800 0900  60 min 60 min    Greater than 50%  of this time was spent counseling and coordinating care related to the above assessment and plan.  Wadie Lessen NP  Palliative Medicine Team Team Phone # [phone] Pager [phone]  Discussed with Dr Wyline Copas 1

## 2013-08-27 MED ORDER — HYDROMORPHONE 0.3 MG/ML IV SOLN
INTRAVENOUS | Status: DC
  Administered 2013-08-28: 1.99 mg via INTRAVENOUS
  Administered 2013-08-28: 1.78 mg via INTRAVENOUS
  Administered 2013-08-28: 2.58 mL via INTRAVENOUS
  Administered 2013-08-28: 0.3 mL via INTRAVENOUS
  Filled 2013-08-27: qty 25

## 2013-08-27 NOTE — Progress Notes (Signed)
See Op note Jillyn Hidden PCCM Pager: [phone] Cell: [phone] If no response, call [phone]

## 2013-08-27 NOTE — Progress Notes (Signed)
Patient NO:MVEH CADON RACZKA      DOB: 1965/06/15      MCN:[id number]   Palliative Medicine Team at [facility] Progress Note    Subjective:  Nesbit was very comfortable, watching Tv and playing video games.  His basilar rate was not stopped over night but he did start his oxycontin and feels very well.  He could not accurately rate his pain but states the "green button is working well for him".  He denies nausea, vomiting or oversedation.  We discontinued his basilar rate this afternoon and will monitor for need to titrate.     Filed Vitals:   08/27/13 1200  BP:   Pulse:   Temp:   Resp: 18   Physical exam:  General : no acute distress, talkative PERRL, EOMI, anicteric, mmm Chest: decreased but clear, no RRW Abd: soft not tender Ext: warm,  Neuro: awake alert and oriented   Lab Results  Component Value Date   CREATININE 0.51 08/24/2013   BUN 13 08/24/2013   NA 135* 08/24/2013   K 4.3 08/24/2013   CL 96 08/24/2013   CO2 26 08/24/2013    Lab Results  Component Value Date   WBC 14.3* 08/24/2013   HGB 11.4* 08/24/2013   HCT 37.0* 08/24/2013   MCV 90.7 08/24/2013   PLT 306 08/24/2013    Assessment and plan: 48 yr old with metastatic lung cancer involving his spine.  Pain had been serious problem. He was started last night on long acting opiate to try to decrease his IV needs and get him ready to go home.  He underwent bronch biopsy yesterday to try to give better diagnostic markers to aid in offering treatment  1.  Full code  2.  Pain in back:  Improved today but patient currently using PCA .3 q 15 , oxycontin 30 mg q 12 and had a basilar rate of dilaudid running at .5 mg/hr.  Plan to stop basilar rate and monitor pain. Total Time: 15 min  Elgie Maziarz L. Lovena Le, MD MBA The Palliative Medicine Team at [facility] Phone: [phone] Pager: [phone]  Addendum: 0000  Checked in on patient's pain. Has begun to flair over the course of the evening since stopping basilar rate.  Pain now  at an 8. Will attempt resume low dose base rate with PCA dosing at 0.3 mg q 15 min and reassess in am.  Resume Dilaudid 0.2 mg/hr with pca hits and oxycontin.   Leonell Lobdell L. Lovena Le, MD MBA The Palliative Medicine Team at [facility] Phone: [phone] Pager: [phone]

## 2013-08-27 NOTE — Progress Notes (Signed)
[facility] PROGRESS NOTE  [name] [name] OXB:[id number] DOB: 01/05/1966 DOA: 08/22/2013  PCP: [name], MD  Brief HPI: [name] [name] is a 48 y.o. male who presented to the ED with slowly worsening chronic back pain. Unfortunately patients back pain is likely due to his extensive vertebral metastatic disease (adenocarcinoma, lung primary). He was diagnosed recently. He was admitted for pain control.  Past medical history:  Past Medical History  Diagnosis Date  . Depression     bipolar  . Hyperlipidemia   . GERD (gastroesophageal reflux disease)     barrett's  . Bipolar 1 disorder   . Pneumonia   . Impaired fasting glucose 08/06/2013  . Normocytic anemia 08/05/2013  . Drug overdose 08/05/2013  . Cancer   . Lung cancer, middle lobe 08/22/2013  . Primary lung cancer with metastasis from lung to other site 08/22/2013  . Cancer associated pain 08/22/2013    Consultants: Onc, PMT, Pulmonary  Procedures: Radiation treatments daily  Antibiotics: None  Subjective: No acute events noted overnight. Reports pain controlled with current regimen  Objective: Vital Signs  Filed Vitals:   08/26/13 2316 08/27/13 0416 08/27/13 0420 08/27/13 0451  BP:  114/71    Pulse:  92    Temp:  98.1 F (36.7 C)    TempSrc:  Oral    Resp: 12 18 13 16   Height:      Weight:      SpO2: 100% 100% 100% 99%    Intake/Output Summary (Last 24 hours) at 08/27/13 1008 Last data filed at 08/27/13 0908  Gross per 24 hour  Intake 293.67 ml  Output    675 ml  Net -381.33 ml   Filed Weights   08/22/13 2059 08/26/13 0457  Weight: 90.2 kg (198 lb 13.7 oz) 88 kg (194 lb 0.1 oz)    General appearance: alert, cooperative, appears stated age and no distress Head: Normocephalic, without obvious abnormality, atraumatic Resp: clear to auscultation bilaterally Cardio: regular rate and rhythm, S1, S2 normal, no murmur, click, rub or gallop GI: soft, non-tender; bowel sounds normal; no masses,  no  organomegaly Pulses: 2+ and symmetric Neurologic: Slightly weak in LLE but able to lift off bed. No other focal deficits.  Lab Results:  Basic Metabolic Panel:  Recent Labs Lab 08/22/13 1537 08/24/13 0520  NA 139 135*  K 4.5 4.3  CL 97 96  CO2 25 26  GLUCOSE 128* 139*  BUN 11 13  CREATININE 0.67 0.51  CALCIUM 10.4 9.5   Liver Function Tests:  Recent Labs Lab 08/22/13 1537  AST 13  ALT 21  ALKPHOS 201*  BILITOT 0.4  PROT 8.9*  ALBUMIN 4.2   CBC:  Recent Labs Lab 08/22/13 1537 08/24/13 0520  WBC 20.2* 14.3*  NEUTROABS 15.9*  --   HGB 13.3 11.4*  HCT 42.0 37.0*  MCV 90.7 90.7  PLT 427* 306   Cardiac Enzymes:  Recent Labs Lab 08/22/13 1537  TROPONINI <0.30     No results found for this or any previous visit (from the past 240 hour(s)).    Studies/Results: Ct Abdomen Pelvis W Contrast  08/25/2013   CLINICAL DATA:  Metastatic adenocarcinoma lung.  EXAM: CT ABDOMEN AND PELVIS WITH CONTRAST  TECHNIQUE: Multidetector CT imaging of the abdomen and pelvis was performed using the standard protocol following bolus administration of intravenous contrast.  CONTRAST:  121m OMNIPAQUE IOHEXOL 300 MG/ML  SOLN  COMPARISON:  CT CHEST W/CM dated 08/11/2013; CT L SPINE W/CM dated 08/16/2013  FINDINGS: Right lower lobe mass is again demonstrated. Metastatic lymph node is noted in the right infrahilar location (image number 3). No pericardial fluid.  No focal hepatic lesion. Gallbladder, pancreas, spleen, adrenal glands, and kidneys are normal.  The stomach, small bowel, and cecum are normal. Appendix is normal. The colon and rectosigmoid colon are normal.  Abdominal aorta is normal caliber. No retroperitoneal or periportal lymphadenopathy.  No free fluid the pelvis. Prostate gland and bladder normal. No pelvic lymphadenopathy.  There is extensive lytic skeletal metastasis with soft tissue expansion with heavy involvement of the left iliac bone and sacrum. Additional lesions noted  throughout the lumbar spine. There is encroachment spinal canal at T12 (image 25, series 2).  IMPRESSION: 1. No evidence of metastasis within the soft tissues of the abdomen or pelvis. 2. 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 3. There is encroachment central canal at T12.   Electronically Signed   By: Suzy Bouchard M.D.   On: 08/25/2013 15:30   Dg Chest Port 1 View  08/26/2013   CLINICAL DATA:  Status post bronchoscopy  EXAM: PORTABLE CHEST - 1 VIEW  COMPARISON:  DG CHEST 2 VIEW dated 08/22/2013  FINDINGS: The lungs are adequately inflated. There is persistent atelectasis in the right infrahilar region. Minimal subsegmental atelectasis on the left lateral to the cardiac border is stable. There is no pleural effusion or pneumothorax or pneumomediastinum. The cardiopericardial silhouette is normal in size. The trachea is midline. The pulmonary vascularity is not engorged. The observed portions of the bony thorax exhibit no acute abnormalities.  IMPRESSION: 1. There is no evidence of a post procedure complication. 2. Persistent areas of atelectasis bilaterally are demonstrated.   Electronically Signed   By: David  Martinique   On: 08/26/2013 16:42   Dg C-arm Bronchoscopy  08/26/2013   CLINICAL DATA: lung mass   C-ARM BRONCHOSCOPY  Fluoroscopy was utilized by the requesting physician.  No radiographic  interpretation.     Medications:  Scheduled: . butamben-tetracaine-benzocaine  1 spray Topical Once  . cyclobenzaprine  10 mg Oral TID  . dexamethasone  4 mg Oral 3 times per day  . HYDROmorphone PCA 0.3 mg/mL   Intravenous 6 times per day  . lidocaine  2 patch Transdermal Q24H  . lidocaine   Topical Once  . nicotine  14 mg Transdermal Daily  . OxyCODONE  30 mg Oral Q12H  . pantoprazole  40 mg Oral Daily  . QUEtiapine  600 mg Oral QHS  . sodium chloride  3 mL Intravenous Q12H   Continuous: . 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 Appreciate Palliative care input for pain management. Requiring ample amounts of narcotics.   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 Definitive management after radiation is completed. Also on steroids. Appreciate Onc input. Pending  input form Dr. Inda Merlin. CT abd/pelvis done on 3/19 for metastatic survey per PMT recs. No intra-abd met lesions but with known spinal mets as noted before. CT head was done in 2/15 and CT chest w/ contrast done 3/5, so did not order these.  Pulm recs noted and appreciated. Pt is s/p bronch with biopsy of RML lesion. Await results.  Leukocytosis Most likely due to steroids. No infection identified. Stable  Code Status: Full Code  DVT Prophylaxis: Heparin    Family Communication: Discussed with patient  Disposition Plan: Home when pain is better controlled and on adequate pain management.    LOS: 5 days   Penn Grissett, Orpah Melter  [facility] Pager [phone]  08/27/2013, 10:08 AM  If 8PM-8AM, please contact night-coverage at [email or web address], password [facility]

## 2013-08-28 MED ORDER — NALOXONE HCL 0.4 MG/ML IJ SOLN: 0.4000 mg | INTRAMUSCULAR | Status: DC | PRN

## 2013-08-28 MED ORDER — HYDROMORPHONE 0.3 MG/ML IV SOLN
INTRAVENOUS | Status: DC
  Administered 2013-08-28: 1.8 mg via INTRAVENOUS

## 2013-08-28 MED ORDER — SODIUM CHLORIDE 0.9 % IJ SOLN: 9.0000 mL | INTRAMUSCULAR | Status: DC | PRN

## 2013-08-28 MED ORDER — ONDANSETRON HCL 4 MG/2ML IJ SOLN: 4.0000 mg | Freq: Four times a day (QID) | INTRAMUSCULAR | Status: DC | PRN

## 2013-08-28 MED ORDER — OXYCODONE HCL ER 15 MG PO T12A
45.0000 mg | EXTENDED_RELEASE_TABLET | Freq: Two times a day (BID) | ORAL | Status: DC
  Administered 2013-08-28: 45 mg via ORAL
  Filled 2013-08-28: qty 3

## 2013-08-28 MED ORDER — DIPHENHYDRAMINE HCL 50 MG/ML IJ SOLN: 12.5000 mg | Freq: Four times a day (QID) | INTRAMUSCULAR | Status: DC | PRN

## 2013-08-28 MED ORDER — HYDROMORPHONE 0.3 MG/ML IV SOLN: INTRAVENOUS | Status: DC

## 2013-08-28 MED ORDER — DIPHENHYDRAMINE HCL 12.5 MG/5ML PO ELIX: 12.5000 mg | ORAL_SOLUTION | Freq: Four times a day (QID) | ORAL | Status: DC | PRN

## 2013-08-28 MED ORDER — HYDROMORPHONE 0.3 MG/ML IV SOLN
INTRAVENOUS | Status: DC
  Administered 2013-08-28: 1.8 mg via INTRAVENOUS
  Administered 2013-08-28: 0.3 mL via INTRAVENOUS
  Administered 2013-08-29: 2.7 mL via INTRAVENOUS
  Administered 2013-08-29: 1.2 mL via INTRAVENOUS
  Filled 2013-08-28: qty 25

## 2013-08-28 NOTE — Progress Notes (Signed)
[facility] PROGRESS NOTE  [name] [name]:[id number] DOB: [birth year] DOA: 08/22/2013  PCP: [name], MD  Brief HPI: [name] [name] is a 48 y.o. male who presented to the ED with slowly worsening chronic back pain. Unfortunately patients back pain is likely due to his extensive vertebral metastatic disease (adenocarcinoma, lung primary). He was diagnosed recently. He was admitted for pain control.  Past medical history:  Past Medical History  Diagnosis Date  . Depression     bipolar  . Hyperlipidemia   . GERD (gastroesophageal reflux disease)     barrett's  . Bipolar 1 disorder   . Pneumonia   . Impaired fasting glucose 08/06/2013  . Normocytic anemia 08/05/2013  . Drug overdose 08/05/2013  . Cancer   . Lung cancer, middle lobe 08/22/2013  . Primary lung cancer with metastasis from lung to other site 08/22/2013  . Cancer associated pain 08/22/2013    Consultants: Onc, PMT, Pulmonary  Procedures: Radiation treatments daily  Antibiotics: None  Subjective: No acute events noted overnight. Reports pain controlled with current regimen  Objective: Vital Signs  Filed Vitals:   08/28/13 0515 08/28/13 1021 08/28/13 1200 08/28/13 1424  BP: 122/68   114/60  Pulse: 92   88  Temp: 97.9 F (36.6 C)   98.1 F (36.7 C)  TempSrc: Oral   Oral  Resp: _0 Height:      Weight:      SpO2: 99% 98% 98% 100%    Intake/Output Summary (Last 24 hours) at 08/28/13 1611 Last data filed at 08/28/13 1300  Gross per 24 hour  Intake 606.33 ml  Output   1625 ml  Net -1018.67 ml   Filed Weights   08/22/13 2059 08/26/13 0457  Weight: 90.2 kg (198 lb 13.7 oz) 88 kg (194 lb 0.1 oz)    General appearance: alert, cooperative, appears stated age and no distress Head: Normocephalic, without obvious abnormality, atraumatic Resp: clear to auscultation bilaterally Cardio: regular rate and rhythm, S1, S2 normal, no murmur, click, rub or gallop GI: soft, non-tender; bowel  sounds normal; no masses,  no organomegaly Pulses: 2+ and symmetric Neurologic: Slightly weak in LLE but able to lift off bed. No other focal deficits.  Lab Results:  Basic Metabolic Panel:  Recent Labs Lab 08/22/13 1537 08/24/13 0520  NA 139 135*  K 4.5 4.3  CL 97 96  CO2 25 26  GLUCOSE 128* 139*  BUN 11 13  CREATININE 0.67 0.51  CALCIUM 10.4 9.5   Liver Function Tests:  Recent Labs Lab 08/22/13 1537  AST 13  ALT 21  ALKPHOS 201*  BILITOT 0.4  PROT 8.9*  ALBUMIN 4.2   CBC:  Recent Labs Lab 08/22/13 1537 08/24/13 0520  WBC 20.2* 14.3*  NEUTROABS 15.9*  --   HGB 13.3 11.4*  HCT 42.0 37.0*  MCV 90.7 90.7  PLT 427* 306   Cardiac Enzymes:  Recent Labs Lab 08/22/13 1537  TROPONINI <0.30     No results found for this or any previous visit (from the past 240 hour(s)).    Studies/Results: Dg Chest Port 1 View  08/26/2013   CLINICAL DATA:  Status post bronchoscopy  EXAM: PORTABLE CHEST - 1 VIEW  COMPARISON:  DG CHEST 2 VIEW dated 08/22/2013  FINDINGS: The lungs are adequately inflated. There is persistent atelectasis in the right infrahilar region. Minimal subsegmental atelectasis on the left lateral to the cardiac border is stable. There is no pleural effusion or pneumothorax  or pneumomediastinum. The cardiopericardial silhouette is normal in size. The trachea is midline. The pulmonary vascularity is not engorged. The observed portions of the bony thorax exhibit no acute abnormalities.  IMPRESSION: 1. There is no evidence of a post procedure complication. 2. Persistent areas of atelectasis bilaterally are demonstrated.   Electronically Signed   By: David  Martinique   On: 08/26/2013 16:42    Medications:  Scheduled: . butamben-tetracaine-benzocaine  1 spray Topical Once  . cyclobenzaprine  10 mg Oral TID  . dexamethasone  4 mg Oral 3 times per day  . HYDROmorphone PCA 0.3 mg/mL   Intravenous 6 times per day  . lidocaine  2 patch Transdermal Q24H  . lidocaine    Topical Once  . nicotine  14 mg Transdermal Daily  . OxyCODONE  45 mg Oral Q12H  . pantoprazole  40 mg Oral Daily  . QUEtiapine  600 mg Oral QHS  . sodium chloride  3 mL Intravenous Q12H   Continuous: . 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 Appreciate Palliative care input for pain management. Requiring ample amounts of narcotics, currently attempting to transition to orals   Metastasis to vertebral column, adenocarcinoma, lung primary Patient receiving radiation treatment which will be continued here. Definitive management after radiation is completed. Also on steroids. Appreciate Onc input. Pending input form Dr. Inda Merlin. CT abd/pelvis done on 3/19 for metastatic survey per PMT recs. No intra-abd met lesions but with known spinal mets as noted before. CT head was done in 2/15 and CT chest w/ contrast done 3/5, so did not order these.  Pulm recs noted and appreciated. Pt is s/p bronch with biopsy of RML lesion. Await results. Pt to follow up with Oncology as outpt.  Leukocytosis Most likely due to steroids. No infection identified. Stable  Code Status: Full Code  DVT Prophylaxis: Heparin    Family Communication: Discussed with patient  Disposition Plan: Home when pain is better controlled and on  adequate pain management.    LOS: 6 days   [name] [name]  [facility] Pager [phone]  08/28/2013, 4:11 PM  If 8PM-8AM, please contact night-coverage at [email or web address], password [facility]

## 2013-08-28 NOTE — Progress Notes (Signed)
Progress Note from the Palliative Medicine Team at [facility]:    -patient is alert and oriented, tells me his pain is better and rates it 6/10, continued  conversation/eduction regarding the importance of converting a to oral agents for pain management  in anticiaption of dc home as soon as medically stable, in hopes of OP f/u with oncology    Objective: Allergies  Allergen Reactions  . Codeine Shortness Of Breath and Nausea And Vomiting       . Morphine Shortness Of Breath and Nausea And Vomiting        Scheduled Meds: . butamben-tetracaine-benzocaine  1 spray Topical Once  . cyclobenzaprine  10 mg Oral TID  . dexamethasone  4 mg Oral 3 times per day  . HYDROmorphone PCA 0.3 mg/mL   Intravenous 6 times per day  . lidocaine  2 patch Transdermal Q24H  . lidocaine   Topical Once  . nicotine  14 mg Transdermal Daily  . OxyCODONE  45 mg Oral Q12H  . pantoprazole  40 mg Oral Daily  . QUEtiapine  600 mg Oral QHS  . sodium chloride  3 mL Intravenous Q12H   Continuous Infusions: . sodium chloride 20 mL/hr at 08/26/13 1515   PRN Meds:.bismuth subsalicylate, calcium carbonate, diphenhydrAMINE, diphenhydrAMINE, lidocaine, lidocaine, midazolam, naloxone, ondansetron (ZOFRAN) IV, ondansetron (ZOFRAN) IV, phenylephrine, phenylephrine, senna-docusate, sodium chloride  BP 114/60  Pulse 88  Temp(Src) 98.1 F (36.7 C) (Oral)  Resp 16  Ht 5\' 9"  (1.753 m)  Wt 88 kg (194 lb 0.1 oz)  BMI 28.64 kg/m2  SpO2 100%   PPS: 50 %  Pain Score: presently 5/10 Pain Location lower back and legs   Intake/Output Summary (Last 24 hours) at 08/28/13 1509 Last data filed at 08/28/13 1300  Gross per 24 hour  Intake 606.33 ml  Output   1000 ml  Net -393.67 ml      LBM: PTA   Stool Softner: Senna-s  Physical Exam:  General: Appears comfortable this morning, tells me his pain is 6/10 HEENT: Mm, no exudate  Chest: CTA  CVS: tachycardic  Abdomen: soft NT +BS, appears out of proprotion   Ext/Neuro:  BLE without edema, equal strength 5/5, no c/o numbness  Labs: CBC    Component Value Date/Time   WBC 14.3* 08/24/2013 0520   RBC 4.08* 08/24/2013 0520   HGB 11.4* 08/24/2013 0520   HCT 37.0* 08/24/2013 0520   PLT 306 08/24/2013 0520   MCV 90.7 08/24/2013 0520   MCH 27.9 08/24/2013 0520   MCHC 30.8 08/24/2013 0520   RDW 16.2* 08/24/2013 0520   LYMPHSABS 1.2 08/22/2013 1537   MONOABS 1.1* 08/22/2013 1537   EOSABS 2.0* 08/22/2013 1537   BASOSABS 0.1 08/22/2013 1537    BMET    Component Value Date/Time   NA 135* 08/24/2013 0520   K 4.3 08/24/2013 0520   CL 96 08/24/2013 0520   CO2 26 08/24/2013 0520   GLUCOSE 139* 08/24/2013 0520   BUN 13 08/24/2013 0520   CREATININE 0.51 08/24/2013 0520   CALCIUM 9.5 08/24/2013 0520   GFRNONAA >90 08/24/2013 0520   GFRAA >90 08/24/2013 0520    CMP     Component Value Date/Time   NA 135* 08/24/2013 0520   K 4.3 08/24/2013 0520   CL 96 08/24/2013 0520   CO2 26 08/24/2013 0520   GLUCOSE 139* 08/24/2013 0520   BUN 13 08/24/2013 0520   CREATININE 0.51 08/24/2013 0520   CALCIUM 9.5 08/24/2013 0520  PROT 8.9* 08/22/2013 1537   ALBUMIN 4.2 08/22/2013 1537   AST 13 08/22/2013 1537   ALT 21 08/22/2013 1537   ALKPHOS 201* 08/22/2013 1537   BILITOT 0.4 08/22/2013 1537   GFRNONAA >90 08/24/2013 0520   GFRAA >90 08/24/2013 0520    Assessment and Plan:  1. Code Status: Full code   2. Scope of Treatment:  At this time patient is open to all available and offered medical interventions to treat his cancer diagnosis and prolong life  1.-  Pain  Increase  OxyContin 45 mg every 12 hrs po prn Decrease PCA Dilaudid prn dose at 0.1 mg every 15 min   Hope is to convert to oral agents for dc home  Continue - scheduled Flexeril 10 mg every 8 hrs  - Lidoderm patch  Patient is comfortable with this plan  - discussed role of breathing, meditation, prayer and relaxation (he utilizes games on phone to relax)   2. Disposition: Hopefully home with the ability to  access OP oncology interventions as soon as pain controled on oral agents  3. Psychosocial: Emotional support to  patient  at bedside.  PMT will continue to support holistically     Wadie Lessen NP  Palliative Medicine Team Team Phone # [phone] Pager [phone]  Discussed with Dr Wyline Copas 1

## 2013-08-29 ENCOUNTER — Encounter (HOSPITAL_COMMUNITY): Payer: Self-pay | Admitting: Pulmonary Disease

## 2013-08-29 MED ORDER — OXYCODONE HCL 5 MG PO TABS
10.0000 mg | ORAL_TABLET | ORAL | Status: DC | PRN
  Administered 2013-08-29: 10 mg via ORAL
  Filled 2013-08-29: qty 2

## 2013-08-29 MED ORDER — DEXAMETHASONE 4 MG PO TABS: 4.0000 mg | ORAL_TABLET | Freq: Three times a day (TID) | ORAL | Status: DC

## 2013-08-29 MED ORDER — OXYCODONE HCL ER 60 MG PO T12A: 60.0000 mg | EXTENDED_RELEASE_TABLET | Freq: Two times a day (BID) | ORAL | Status: DC

## 2013-08-29 MED ORDER — OXYCODONE HCL ER 40 MG PO T12A
60.0000 mg | EXTENDED_RELEASE_TABLET | Freq: Two times a day (BID) | ORAL | Status: DC
  Administered 2013-08-29: 60 mg via ORAL
  Filled 2013-08-29 (×2): qty 1

## 2013-08-29 MED ORDER — SENNOSIDES-DOCUSATE SODIUM 8.6-50 MG PO TABS: 2.0000 | ORAL_TABLET | Freq: Every evening | ORAL | Status: DC | PRN

## 2013-08-29 MED ORDER — LIDOCAINE 5 % EX PTCH: 2.0000 | MEDICATED_PATCH | CUTANEOUS | Status: DC

## 2013-08-29 MED ORDER — OXYCODONE HCL 10 MG PO TABS
10.0000 mg | ORAL_TABLET | ORAL | Status: DC | PRN
Start: 2013-08-29 — End: 2013-09-02

## 2013-08-29 NOTE — Progress Notes (Signed)
Progress Note from the Palliative Medicine Team at [facility]:    -patient is alert and oriented, tells me his pain is better and rates it 4/10, and "ready to go home"  Objective: Allergies  Allergen Reactions  . Codeine Shortness Of Breath and Nausea And Vomiting       . Morphine Shortness Of Breath and Nausea And Vomiting        Scheduled Meds: . butamben-tetracaine-benzocaine  1 spray Topical Once  . cyclobenzaprine  10 mg Oral TID  . dexamethasone  4 mg Oral 3 times per day  . lidocaine  2 patch Transdermal Q24H  . lidocaine   Topical Once  . nicotine  14 mg Transdermal Daily  . OxyCODONE  60 mg Oral Q12H  . pantoprazole  40 mg Oral Daily  . QUEtiapine  600 mg Oral QHS  . sodium chloride  3 mL Intravenous Q12H   Continuous Infusions: . sodium chloride 20 mL/hr at 08/26/13 1515   PRN Meds:.bismuth subsalicylate, calcium carbonate, lidocaine, lidocaine, midazolam, ondansetron (ZOFRAN) IV, oxyCODONE, phenylephrine, phenylephrine, senna-docusate  BP 108/67  Pulse 87  Temp(Src) 98.4 F (36.9 C) (Oral)  Resp 15  Ht 5\' 9"  (1.753 m)  Wt 88 kg (194 lb 0.1 oz)  BMI 28.64 kg/m2  SpO2 100%   PPS: 50 %  Pain Score: presently 4/10 Pain Location lower back and legs   Intake/Output Summary (Last 24 hours) at 08/29/13 0759 Last data filed at 08/29/13 0700  Gross per 24 hour  Intake    600 ml  Output   2200 ml  Net  -1600 ml      LBM: PTA   Stool Softner: Senna-s  Physical Exam:  General: Appears comfortable this morning, tells me his pain is 4/10 HEENT: Mm, no exudate  Chest: CTA  CVS: tachycardic Skin: warm and dry  Abdomen: soft NT +BS, appears out of proprotion  Ext/Neuro:  BLE without edema, equal strength 5/5, no c/o numbness  Labs: CBC    Component Value Date/Time   WBC 14.3* 08/24/2013 0520   RBC 4.08* 08/24/2013 0520   HGB 11.4* 08/24/2013 0520   HCT 37.0* 08/24/2013 0520   PLT 306 08/24/2013 0520   MCV 90.7 08/24/2013 0520   MCH 27.9  08/24/2013 0520   MCHC 30.8 08/24/2013 0520   RDW 16.2* 08/24/2013 0520   LYMPHSABS 1.2 08/22/2013 1537   MONOABS 1.1* 08/22/2013 1537   EOSABS 2.0* 08/22/2013 1537   BASOSABS 0.1 08/22/2013 1537    BMET    Component Value Date/Time   NA 135* 08/24/2013 0520   K 4.3 08/24/2013 0520   CL 96 08/24/2013 0520   CO2 26 08/24/2013 0520   GLUCOSE 139* 08/24/2013 0520   BUN 13 08/24/2013 0520   CREATININE 0.51 08/24/2013 0520   CALCIUM 9.5 08/24/2013 0520   GFRNONAA >90 08/24/2013 0520   GFRAA >90 08/24/2013 0520    CMP     Component Value Date/Time   NA 135* 08/24/2013 0520   K 4.3 08/24/2013 0520   CL 96 08/24/2013 0520   CO2 26 08/24/2013 0520   GLUCOSE 139* 08/24/2013 0520   BUN 13 08/24/2013 0520   CREATININE 0.51 08/24/2013 0520   CALCIUM 9.5 08/24/2013 0520   PROT 8.9* 08/22/2013 1537   ALBUMIN 4.2 08/22/2013 1537   AST 13 08/22/2013 1537   ALT 21 08/22/2013 1537   ALKPHOS 201* 08/22/2013 1537   BILITOT 0.4 08/22/2013 1537   GFRNONAA >90 08/24/2013 0520  GFRAA >90 08/24/2013 0520    Assessment and Plan:  1. Code Status: Full code   2. Scope of Treatment:  At this time patient is open to all available and offered medical interventions to treat his cancer diagnosis and prolong life  Symptom management  1.-  Pain  Increase  OxyContin 60 mg every 12 hrs po prn                   Oxycodone IR 10 mg every 4 hrs prn D/C PCA Dilaudid PCA  Continue - scheduled Flexeril 10 mg every 8 hrs  - D/C Lidoderm patch  Patient is comfortable with this plan, discussed role of breathing, meditation, prayer and relaxation (he utilizes games on phone to relax)   2. Disposition: Hopefully home with the ability to access OP oncology interventions as soon as pain controled on oral agents  3. Psychosocial: Emotional support to  patient  at bedside.  PMT will continue to support holistically     Wadie Lessen NP  Palliative Medicine Team Team Phone # [phone] Pager [phone]  Discussed with Dr  Wyline Copas 1
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
## 2013-08-30 ENCOUNTER — Telehealth: Payer: Self-pay | Admitting: Internal Medicine

## 2013-08-30 ENCOUNTER — Encounter: Payer: Self-pay | Admitting: Internal Medicine

## 2013-08-30 NOTE — Telephone Encounter (Signed)
C/D 08/30/13 for appt, 09/07/13

## 2013-08-31 ENCOUNTER — Telehealth: Payer: Self-pay | Admitting: Gastroenterology

## 2013-08-31 MED ORDER — DEXLANSOPRAZOLE 60 MG PO CPDR: 60.0000 mg | DELAYED_RELEASE_CAPSULE | Freq: Every day | ORAL | Status: DC

## 2013-08-31 NOTE — Telephone Encounter (Signed)
Appt cancelled and Dr. Deatra Ina notified.

## 2013-09-02 ENCOUNTER — Ambulatory Visit: Payer: Medicare Other | Admitting: Internal Medicine

## 2013-09-02 ENCOUNTER — Encounter: Payer: Self-pay | Admitting: Internal Medicine

## 2013-09-02 ENCOUNTER — Encounter (HOSPITAL_COMMUNITY): Payer: Self-pay

## 2013-09-02 ENCOUNTER — Telehealth: Payer: Self-pay | Admitting: Medical Oncology

## 2013-09-02 DIAGNOSIS — C3490 Malignant neoplasm of unspecified part of unspecified bronchus or lung: Secondary | ICD-10-CM

## 2013-09-02 MED ORDER — OXYCODONE HCL 10 MG PO TABS: 10.0000 mg | ORAL_TABLET | ORAL | Status: DC | PRN

## 2013-09-02 NOTE — Telephone Encounter (Signed)
Mother called and said pt will not have enough breakthrough med over the weekend. Has appointment Monday with Dr Julien Nordmann. Refill authorized and locked in injection room. Mother notified to pick up rx today.

## 2013-09-06 ENCOUNTER — Other Ambulatory Visit: Payer: Self-pay | Admitting: *Deleted

## 2013-09-06 ENCOUNTER — Encounter: Payer: Medicare Other | Admitting: Gastroenterology

## 2013-09-06 DIAGNOSIS — C7949 Secondary malignant neoplasm of other parts of nervous system: Secondary | ICD-10-CM

## 2013-09-07 ENCOUNTER — Other Ambulatory Visit (HOSPITAL_BASED_OUTPATIENT_CLINIC_OR_DEPARTMENT_OTHER): Payer: Medicare Other

## 2013-09-07 ENCOUNTER — Ambulatory Visit: Payer: Medicare Other

## 2013-09-07 ENCOUNTER — Encounter: Payer: Self-pay | Admitting: Internal Medicine

## 2013-09-07 ENCOUNTER — Ambulatory Visit (HOSPITAL_BASED_OUTPATIENT_CLINIC_OR_DEPARTMENT_OTHER): Payer: Medicare Other | Admitting: Internal Medicine

## 2013-09-07 ENCOUNTER — Telehealth: Payer: Self-pay | Admitting: Internal Medicine

## 2013-09-07 ENCOUNTER — Other Ambulatory Visit: Payer: Self-pay | Admitting: Internal Medicine

## 2013-09-07 VITALS — BP 132/67 | HR 103 | Temp 97.0°F | Resp 18 | Ht 69.0 in | Wt 196.3 lb

## 2013-09-07 DIAGNOSIS — C7952 Secondary malignant neoplasm of bone marrow: Secondary | ICD-10-CM

## 2013-09-07 DIAGNOSIS — R059 Cough, unspecified: Secondary | ICD-10-CM

## 2013-09-07 DIAGNOSIS — C7951 Secondary malignant neoplasm of bone: Secondary | ICD-10-CM

## 2013-09-07 DIAGNOSIS — C7949 Secondary malignant neoplasm of other parts of nervous system: Secondary | ICD-10-CM

## 2013-09-07 DIAGNOSIS — F17200 Nicotine dependence, unspecified, uncomplicated: Secondary | ICD-10-CM

## 2013-09-07 DIAGNOSIS — G9389 Other specified disorders of brain: Secondary | ICD-10-CM

## 2013-09-07 DIAGNOSIS — C3490 Malignant neoplasm of unspecified part of unspecified bronchus or lung: Secondary | ICD-10-CM

## 2013-09-07 DIAGNOSIS — C342 Malignant neoplasm of middle lobe, bronchus or lung: Secondary | ICD-10-CM

## 2013-09-07 DIAGNOSIS — R05 Cough: Secondary | ICD-10-CM

## 2013-09-07 DIAGNOSIS — C799 Secondary malignant neoplasm of unspecified site: Secondary | ICD-10-CM

## 2013-09-07 LAB — CBC WITH DIFFERENTIAL/PLATELET
BASO%: 0.2 % (ref 0.0–2.0)
Basophils Absolute: 0 10*3/uL (ref 0.0–0.1)
EOS%: 3.7 % (ref 0.0–7.0)
Eosinophils Absolute: 0.9 10*3/uL — ABNORMAL HIGH (ref 0.0–0.5)
HCT: 40.4 % (ref 38.4–49.9)
HGB: 12.9 g/dL — ABNORMAL LOW (ref 13.0–17.1)
LYMPH%: 2.1 % — AB (ref 14.0–49.0)
MCH: 28.2 pg (ref 27.2–33.4)
MCHC: 32 g/dL (ref 32.0–36.0)
MCV: 88.2 fL (ref 79.3–98.0)
MONO#: 1.7 10*3/uL — ABNORMAL HIGH (ref 0.1–0.9)
MONO%: 6.9 % (ref 0.0–14.0)
NEUT#: 21.8 10*3/uL — ABNORMAL HIGH (ref 1.5–6.5)
NEUT%: 87.1 % — ABNORMAL HIGH (ref 39.0–75.0)
PLATELETS: 182 10*3/uL (ref 140–400)
RBC: 4.58 10*6/uL (ref 4.20–5.82)
RDW: 19.2 % — ABNORMAL HIGH (ref 11.0–14.6)
WBC: 25 10*3/uL — ABNORMAL HIGH (ref 4.0–10.3)
lymph#: 0.5 10*3/uL — ABNORMAL LOW (ref 0.9–3.3)

## 2013-09-07 LAB — COMPREHENSIVE METABOLIC PANEL (CC13)
ALK PHOS: 204 U/L — AB (ref 40–150)
ALT: 181 U/L — AB (ref 0–55)
ANION GAP: 9 meq/L (ref 3–11)
AST: 28 U/L (ref 5–34)
Albumin: 3.4 g/dL — ABNORMAL LOW (ref 3.5–5.0)
BILIRUBIN TOTAL: 0.4 mg/dL (ref 0.20–1.20)
BUN: 15.7 mg/dL (ref 7.0–26.0)
CO2: 26 meq/L (ref 22–29)
Calcium: 8.9 mg/dL (ref 8.4–10.4)
Chloride: 102 mEq/L (ref 98–109)
Creatinine: 0.7 mg/dL (ref 0.7–1.3)
Glucose: 125 mg/dl (ref 70–140)
POTASSIUM: 5 meq/L (ref 3.5–5.1)
SODIUM: 137 meq/L (ref 136–145)
Total Protein: 6.6 g/dL (ref 6.4–8.3)

## 2013-09-07 LAB — TECHNOLOGIST REVIEW

## 2013-09-07 MED ORDER — PROCHLORPERAZINE MALEATE 10 MG PO TABS: 10.0000 mg | ORAL_TABLET | Freq: Four times a day (QID) | ORAL | Status: DC | PRN

## 2013-09-07 MED ORDER — FOLIC ACID 1 MG PO TABS: 1.0000 mg | ORAL_TABLET | Freq: Every day | ORAL | Status: DC

## 2013-09-07 MED ORDER — DEXAMETHASONE 4 MG PO TABS: 4.0000 mg | ORAL_TABLET | Freq: Two times a day (BID) | ORAL | Status: DC

## 2013-09-07 MED ORDER — OXYCODONE HCL 10 MG PO TABS: 10.0000 mg | ORAL_TABLET | ORAL | Status: DC | PRN

## 2013-09-07 MED ORDER — CYANOCOBALAMIN 1000 MCG/ML IJ SOLN
INTRAMUSCULAR | Status: AC
  Filled 2013-09-07: qty 1

## 2013-09-07 MED ORDER — CYANOCOBALAMIN 1000 MCG/ML IJ SOLN
1000.0000 ug | Freq: Once | INTRAMUSCULAR | Status: AC
  Administered 2013-09-07: 1000 ug via INTRAMUSCULAR

## 2013-09-07 NOTE — Progress Notes (Signed)
Faxed oxycontin 60mg  pa form to [facility]

## 2013-09-07 NOTE — Progress Notes (Signed)
Checked in new pt with no financial concerns. °

## 2013-09-07 NOTE — Telephone Encounter (Signed)
gv and printed aptp sched and avs for pt for April....sed added tx. °

## 2013-09-07 NOTE — Patient Instructions (Signed)
Smoking Cessation, Tips for Success If you are ready to quit smoking, congratulations! You have chosen to help yourself be healthier. Cigarettes bring nicotine, tar, carbon monoxide, and other irritants into your body. Your lungs, heart, and blood vessels will be able to work better without these poisons. There are many different ways to quit smoking. Nicotine gum, nicotine patches, a nicotine inhaler, or nicotine nasal spray can help with physical craving. Hypnosis, support groups, and medicines help break the habit of smoking. WHAT THINGS CAN I DO TO MAKE QUITTING EASIER?  Here are some tips to help you quit for good:  Pick a date when you will quit smoking completely. Tell all of your friends and family about your plan to quit on that date.  Do not try to slowly cut down on the number of cigarettes you are smoking. Pick a quit date and quit smoking completely starting on that day.  Throw away all cigarettes.   Clean and remove all ashtrays from your home, work, and car.   On a card, write down your reasons for quitting. Carry the card with you and read it when you get the urge to smoke.   Cleanse your body of nicotine. Drink enough water and fluids to keep your urine clear or pale yellow. Do this after quitting to flush the nicotine from your body.   Learn to predict your moods. Do not let a bad situation be your excuse to have a cigarette. Some situations in your life might tempt you into wanting a cigarette.   Never have "just one" cigarette. It leads to wanting another and another. Remind yourself of your decision to quit.   Change habits associated with smoking. If you smoked while driving or when feeling stressed, try other activities to replace smoking. Stand up when drinking your coffee. Brush your teeth after eating. Sit in a different chair when you read the paper. Avoid alcohol while trying to quit, and try to drink fewer caffeinated beverages. Alcohol and caffeine may urge  you to smoke.   Avoid foods and drinks that can trigger a desire to smoke, such as sugary or spicy foods and alcohol.   Ask people who smoke not to smoke around you.   Have something planned to do right after eating or having a cup of coffee. For example, plan to take a walk or exercise.   Try a relaxation exercise to calm you down and decrease your stress. Remember, you may be tense and nervous for the first 2 weeks after you quit, but this will pass.   Find new activities to keep your hands busy. Play with a pen, coin, or rubber band. Doodle or draw things on paper.   Brush your teeth right after eating. This will help cut down on the craving for the taste of tobacco after meals. You can also try mouthwash.   Use oral substitutes in place of cigarettes. Try using lemon drops, carrots, cinnamon sticks, or chewing gum. Keep them handy so they are available when you have the urge to smoke.   When you have the urge to smoke, try deep breathing.   Designate your home as a nonsmoking area.   If you are a heavy smoker, ask your health care provider about a prescription for nicotine chewing gum. It can ease your withdrawal from nicotine.   Reward yourself. Set aside the cigarette money you save and buy yourself something nice.   Look for support from others. Join a support group or   smoking cessation program. Ask someone at home or at work to help you with your plan to quit smoking.   Always ask yourself, "Do I need this cigarette or is this just a reflex?" Tell yourself, "Today, I choose not to smoke," or "I do not want to smoke." You are reminding yourself of your decision to quit.  Do not replace cigarette smoking with electronic cigarettes (commonly called e-cigarettes). The safety of e-cigarettes is unknown, and some may contain harmful chemicals.  If you relapse, do not give up! Plan ahead and think about what you will do the next time you get the urge to smoke.  HOW WILL  I FEEL WHEN I QUIT SMOKING? You may have symptoms of withdrawal because your body is used to nicotine (the addictive substance in cigarettes). You may crave cigarettes, be irritable, feel very hungry, cough often, get headaches, or have difficulty concentrating. The withdrawal symptoms are only temporary. They are strongest when you first quit but will go away within 10 14 days. When withdrawal symptoms occur, stay in control. Think about your reasons for quitting. Remind yourself that these are signs that your body is healing and getting used to being without cigarettes. Remember that withdrawal symptoms are easier to treat than the major diseases that smoking can cause.  Even after the withdrawal is over, expect periodic urges to smoke. However, these cravings are generally short lived and will go away whether you smoke or not. Do not smoke!  WHAT RESOURCES ARE AVAILABLE TO HELP ME QUIT SMOKING? Your health care provider can direct you to community resources or hospitals for support, which may include:  Group support.  Education.  Hypnosis.  Therapy. Document Released: 02/22/2004 Document Revised: 03/16/2013 Document Reviewed: 11/11/2012 [employer] Patient Information 2014 [employer]  
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
## 2013-09-08 ENCOUNTER — Telehealth: Payer: Self-pay | Admitting: *Deleted

## 2013-09-08 ENCOUNTER — Encounter: Payer: Self-pay | Admitting: Internal Medicine

## 2013-09-08 ENCOUNTER — Other Ambulatory Visit: Payer: Self-pay | Admitting: *Deleted

## 2013-09-08 NOTE — Telephone Encounter (Signed)
Pt's mother Judson Roch called wanting to know whether patient needed to have his teeth cleaned for his dental clearance appt.  Pt does not like getting his teeth cleaned and has not been to the dentist in years.  Per dr Vista Mink, okay to just have evaluation, does not need teeth cleaned.  SLJ

## 2013-09-08 NOTE — Progress Notes (Signed)
[employer] approved oxycontin 60 mg from 09/07/13-06/08/14

## 2013-09-09 ENCOUNTER — Other Ambulatory Visit: Payer: Self-pay | Admitting: Internal Medicine

## 2013-09-09 ENCOUNTER — Ambulatory Visit (HOSPITAL_COMMUNITY)
Admission: RE | Admit: 2013-09-09 | Discharge: 2013-09-09 | Disposition: A | Discharge status: Home / Self Care | Payer: Medicare Other | Source: Ambulatory Visit | Attending: Internal Medicine | Admitting: Internal Medicine

## 2013-09-09 ENCOUNTER — Encounter (HOSPITAL_COMMUNITY): Payer: Self-pay

## 2013-09-09 ENCOUNTER — Telehealth: Payer: Self-pay | Admitting: Dietician

## 2013-09-09 DIAGNOSIS — G319 Degenerative disease of nervous system, unspecified: Secondary | ICD-10-CM | POA: Insufficient documentation

## 2013-09-09 DIAGNOSIS — J323 Chronic sphenoidal sinusitis: Secondary | ICD-10-CM | POA: Insufficient documentation

## 2013-09-09 DIAGNOSIS — C3490 Malignant neoplasm of unspecified part of unspecified bronchus or lung: Secondary | ICD-10-CM

## 2013-09-09 MED ORDER — IOHEXOL 300 MG/ML  SOLN
100.0000 mL | Freq: Once | INTRAMUSCULAR | Status: AC | PRN
  Administered 2013-09-09: 100 mL via INTRAVENOUS

## 2013-09-09 NOTE — Telephone Encounter (Signed)
Brief Outpatient Oncology Nutrition Note  Patient has been identified to be at risk on malnutrition screen.  Wt Readings from Last 10 Encounters:  09/07/13 196 lb 4.8 oz (89.041 kg)  08/26/13 194 lb 0.1 oz (88 kg)  08/26/13 194 lb 0.1 oz (88 kg)  08/26/13 194 lb 0.1 oz (88 kg)  08/16/13 203 lb 7.8 oz (92.3 kg)  08/15/13 205 lb (92.987 kg)  08/12/13 209 lb 6.4 oz (94.983 kg)  08/09/13 210 lb 6.4 oz (95.437 kg)  08/08/13 209 lb 1.9 oz (94.856 kg)  08/05/13 180 lb 1.9 oz (81.7 kg)    Dx:  Metastatic lung cancer.  Called patient due to weight loss.  He was unavailable.  Outpatient cancer center RD contact information provided.  Antonieta Iba, RD, LDN

## 2013-09-12 ENCOUNTER — Ambulatory Visit: Payer: Medicare Other

## 2013-09-12 NOTE — Progress Notes (Signed)
Met with patient and his mother in chemo class today.  Patient came in angry and initially stated to chaplain who was speaking that he doesn't want to hear this "bullcrap" he just needs the important information.  Mother attempted to settle him down.  He settled down and sat for the rest of the session and actively participated and listened with little disruption until the end when the mother asked if patient was allowed to use marijuana.  I told her that I could not answer that question that she would have to ask Dr. Rogue Jury.  Patient became angry and stated his mother took his marijuana away from hm and he would get it back.  Discussed this with Dr. Stanford Breed nurse. PAtient also stated that he would like a portacath and would like smoking cessation medication.  Gave this information to Dr. Cecil Cranker nurse. PAtient and mother wanted to sign POA paperwork and living well.  Called Lauren the Education officer, [employer] and she will meet with patient tomorrow during his chemo appt.

## 2013-09-12 NOTE — Patient Instructions (Signed)
Carboplatin injection What is this medicine? CARBOPLATIN (KAR boe pla tin) is a chemotherapy drug. It targets fast dividing cells, like cancer cells, and causes these cells to die. This medicine is used to treat ovarian cancer and many other cancers. This medicine may be used for other purposes; ask your health care provider or pharmacist if you have questions. COMMON BRAND NAME(S): Paraplatin What should I tell my health care provider before I take this medicine? They need to know if you have any of these conditions: -blood disorders -hearing problems -kidney disease -recent or ongoing radiation therapy -an unusual or allergic reaction to carboplatin, cisplatin, other chemotherapy, other medicines, foods, dyes, or preservatives -pregnant or trying to get pregnant -breast-feeding How should I use this medicine? This drug is usually given as an infusion into a vein. It is administered in a hospital or clinic by a specially trained health care professional. Talk to your pediatrician regarding the use of this medicine in children. Special care may be needed. Overdosage: If you think you have taken too much of this medicine contact a poison control center or emergency room at once. NOTE: This medicine is only for you. Do not share this medicine with others. What if I miss a dose? It is important not to miss a dose. Call your doctor or health care professional if you are unable to keep an appointment. What may interact with this medicine? -medicines for seizures -medicines to increase blood counts like filgrastim, pegfilgrastim, sargramostim -some antibiotics like amikacin, gentamicin, neomycin, streptomycin, tobramycin -vaccines Talk to your doctor or health care professional before taking any of these medicines: -acetaminophen -aspirin -ibuprofen -ketoprofen -naproxen This list may not describe all possible interactions. Give your health care provider a list of all the medicines, herbs,  non-prescription drugs, or dietary supplements you use. Also tell them if you smoke, drink alcohol, or use illegal drugs. Some items may interact with your medicine. What should I watch for while using this medicine? Your condition will be monitored carefully while you are receiving this medicine. You will need important blood work done while you are taking this medicine. This drug may make you feel generally unwell. This is not uncommon, as chemotherapy can affect healthy cells as well as cancer cells. Report any side effects. Continue your course of treatment even though you feel ill unless your doctor tells you to stop. In some cases, you may be given additional medicines to help with side effects. Follow all directions for their use. Call your doctor or health care professional for advice if you get a fever, chills or sore throat, or other symptoms of a cold or flu. Do not treat yourself. This drug decreases your body's ability to fight infections. Try to avoid being around people who are sick. This medicine may increase your risk to bruise or bleed. Call your doctor or health care professional if you notice any unusual bleeding. Be careful brushing and flossing your teeth or using a toothpick because you may get an infection or bleed more easily. If you have any dental work done, tell your dentist you are receiving this medicine. Avoid taking products that contain aspirin, acetaminophen, ibuprofen, naproxen, or ketoprofen unless instructed by your doctor. These medicines may hide a fever. Do not become pregnant while taking this medicine. Women should inform their doctor if they wish to become pregnant or think they might be pregnant. There is a potential for serious side effects to an unborn child. Talk to your health care professional or  pharmacist for more information. Do not breast-feed an infant while taking this medicine. What side effects may I notice from receiving this medicine? Side effects  that you should report to your doctor or health care professional as soon as possible: -allergic reactions like skin rash, itching or hives, swelling of the face, lips, or tongue -signs of infection - fever or chills, cough, sore throat, pain or difficulty passing urine -signs of decreased platelets or bleeding - bruising, pinpoint red spots on the skin, black, tarry stools, nosebleeds -signs of decreased red blood cells - unusually weak or tired, fainting spells, lightheadedness -breathing problems -changes in hearing -changes in vision -chest pain -high blood pressure -low blood counts - This drug may decrease the number of white blood cells, red blood cells and platelets. You may be at increased risk for infections and bleeding. -nausea and vomiting -pain, swelling, redness or irritation at the injection site -pain, tingling, numbness in the hands or feet -problems with balance, talking, walking -trouble passing urine or change in the amount of urine Side effects that usually do not require medical attention (report to your doctor or health care professional if they continue or are bothersome): -hair loss -loss of appetite -metallic taste in the mouth or changes in taste This list may not describe all possible side effects. Call your doctor for medical advice about side effects. You may report side effects to FDA at 1-800-FDA-1088. Where should I keep my medicine? This drug is given in a hospital or clinic and will not be stored at home. NOTE: This sheet is a summary. It may not cover all possible information. If you have questions about this medicine, talk to your doctor, pharmacist, or health care provider.  2014, [employer]. (2007-08-31 14:38:05) Pemetrexed injection What is this medicine? PEMETREXED (PEM e TREX ed) is a chemotherapy drug. This medicine affects cells that are rapidly growing, such as cancer cells and cells in your mouth and stomach. It is usually used to  treat lung cancers like non-small cell lung cancer and mesothelioma. It may also be used to treat other cancers. This medicine may be used for other purposes; ask your health care provider or pharmacist if you have questions. COMMON BRAND NAME(S): Alimta What should I tell my health care provider before I take this medicine? They need to know if you have any of these conditions: -if you frequently drink alcohol containing beverages -infection (especially a virus infection such as chickenpox, cold sores, or herpes) -kidney disease -liver disease -low blood counts, like low platelets, red bloods, or white blood cells -an unusual or allergic reaction to pemetrexed, mannitol, other medicines, foods, dyes, or preservatives -pregnant or trying to get pregnant -breast-feeding How should I use this medicine? This drug is given as an infusion into a vein. It is administered in a hospital or clinic by a specially trained health care professional. Talk to your pediatrician regarding the use of this medicine in children. Special care may be needed. Overdosage: If you think you have taken too much of this medicine contact a poison control center or emergency room at once. NOTE: This medicine is only for you. Do not share this medicine with others. What if I miss a dose? It is important not to miss your dose. Call your doctor or health care professional if you are unable to keep an appointment. What may interact with this medicine? -aspirin and aspirin-like medicines -medicines to increase blood counts like filgrastim, pegfilgrastim, sargramostim -methotrexate -NSAIDS, medicines for pain  and inflammation, like ibuprofen or naproxen -probenecid -pyrimethamine -vaccines Talk to your doctor or health care professional before taking any of these medicines: -acetaminophen -aspirin -ibuprofen -ketoprofen -naproxen This list may not describe all possible interactions. Give your health care provider a  list of all the medicines, herbs, non-prescription drugs, or dietary supplements you use. Also tell them if you smoke, drink alcohol, or use illegal drugs. Some items may interact with your medicine. What should I watch for while using this medicine? Visit your doctor for checks on your progress. This drug may make you feel generally unwell. This is not uncommon, as chemotherapy can affect healthy cells as well as cancer cells. Report any side effects. Continue your course of treatment even though you feel ill unless your doctor tells you to stop. In some cases, you may be given additional medicines to help with side effects. Follow all directions for their use. Call your doctor or health care professional for advice if you get a fever, chills or sore throat, or other symptoms of a cold or flu. Do not treat yourself. This drug decreases your body's ability to fight infections. Try to avoid being around people who are sick. This medicine may increase your risk to bruise or bleed. Call your doctor or health care professional if you notice any unusual bleeding. Be careful brushing and flossing your teeth or using a toothpick because you may get an infection or bleed more easily. If you have any dental work done, tell your dentist you are receiving this medicine. Avoid taking products that contain aspirin, acetaminophen, ibuprofen, naproxen, or ketoprofen unless instructed by your doctor. These medicines may hide a fever. Call your doctor or health care professional if you get diarrhea or mouth sores. Do not treat yourself. To protect your kidneys, drink water or other fluids as directed while you are taking this medicine. Men and women must use effective birth control while taking this medicine. You may also need to continue using effective birth control for a time after stopping this medicine. Do not become pregnant while taking this medicine. Tell your doctor right away if you think that you or your partner  might be pregnant. There is a potential for serious side effects to an unborn child. Talk to your health care professional or pharmacist for more information. Do not breast-feed an infant while taking this medicine. This medicine may lower sperm counts. What side effects may I notice from receiving this medicine? Side effects that you should report to your doctor or health care professional as soon as possible: -allergic reactions like skin rash, itching or hives, swelling of the face, lips, or tongue -low blood counts - this medicine may decrease the number of white blood cells, red blood cells and platelets. You may be at increased risk for infections and bleeding. -signs of infection - fever or chills, cough, sore throat, pain or difficulty passing urine -signs of decreased platelets or bleeding - bruising, pinpoint red spots on the skin, black, tarry stools, blood in the urine -signs of decreased red blood cells - unusually weak or tired, fainting spells, lightheadedness -breathing problems, like a dry cough -changes in emotions or moods -chest pain -confusion -diarrhea -high blood pressure -mouth or throat sores or ulcers -pain, swelling, warmth in the leg -pain on swallowing -swelling of the ankles, feet, hands -trouble passing urine or change in the amount of urine -vomiting -yellowing of the eyes or skin Side effects that usually do not require medical attention (report to  your doctor or health care professional if they continue or are bothersome): -hair loss -loss of appetite -nausea -stomach upset This list may not describe all possible side effects. Call your doctor for medical advice about side effects. You may report side effects to FDA at 1-800-FDA-1088. Where should I keep my medicine? This drug is given in a hospital or clinic and will not be stored at home. NOTE: This sheet is a summary. It may not cover all possible information. If you have questions about this  medicine, talk to your doctor, pharmacist, or health care provider.  2014, [employer]. (2007-12-28 13:24:03)

## 2013-09-13 ENCOUNTER — Telehealth: Payer: Self-pay | Admitting: *Deleted

## 2013-09-13 ENCOUNTER — Ambulatory Visit (HOSPITAL_BASED_OUTPATIENT_CLINIC_OR_DEPARTMENT_OTHER): Payer: Medicare Other

## 2013-09-13 ENCOUNTER — Encounter: Payer: Self-pay | Admitting: Specialist

## 2013-09-13 ENCOUNTER — Encounter: Payer: Self-pay | Admitting: *Deleted

## 2013-09-13 ENCOUNTER — Other Ambulatory Visit: Payer: Self-pay

## 2013-09-13 ENCOUNTER — Other Ambulatory Visit (HOSPITAL_BASED_OUTPATIENT_CLINIC_OR_DEPARTMENT_OTHER): Payer: Medicare Other

## 2013-09-13 VITALS — BP 107/58 | HR 101 | Temp 98.3°F

## 2013-09-13 DIAGNOSIS — C342 Malignant neoplasm of middle lobe, bronchus or lung: Secondary | ICD-10-CM

## 2013-09-13 DIAGNOSIS — C7951 Secondary malignant neoplasm of bone: Secondary | ICD-10-CM

## 2013-09-13 DIAGNOSIS — C7952 Secondary malignant neoplasm of bone marrow: Secondary | ICD-10-CM

## 2013-09-13 DIAGNOSIS — C3490 Malignant neoplasm of unspecified part of unspecified bronchus or lung: Secondary | ICD-10-CM

## 2013-09-13 DIAGNOSIS — Z5111 Encounter for antineoplastic chemotherapy: Secondary | ICD-10-CM

## 2013-09-13 LAB — CBC WITH DIFFERENTIAL/PLATELET
BASO%: 0.3 % (ref 0.0–2.0)
BASOS ABS: 0 10*3/uL (ref 0.0–0.1)
EOS%: 10.9 % — ABNORMAL HIGH (ref 0.0–7.0)
Eosinophils Absolute: 1.6 10*3/uL — ABNORMAL HIGH (ref 0.0–0.5)
HEMATOCRIT: 38.9 % (ref 38.4–49.9)
HEMOGLOBIN: 12 g/dL — AB (ref 13.0–17.1)
LYMPH#: 0.7 10*3/uL — AB (ref 0.9–3.3)
LYMPH%: 5 % — ABNORMAL LOW (ref 14.0–49.0)
MCH: 28.4 pg (ref 27.2–33.4)
MCHC: 30.8 g/dL — ABNORMAL LOW (ref 32.0–36.0)
MCV: 92 fL (ref 79.3–98.0)
MONO#: 0.9 10*3/uL (ref 0.1–0.9)
MONO%: 6.1 % (ref 0.0–14.0)
NEUT%: 77.7 % — ABNORMAL HIGH (ref 39.0–75.0)
NEUTROS ABS: 11.2 10*3/uL — AB (ref 1.5–6.5)
Platelets: 140 10*3/uL (ref 140–400)
RBC: 4.23 10*6/uL (ref 4.20–5.82)
RDW: 20.2 % — ABNORMAL HIGH (ref 11.0–14.6)
WBC: 14.4 10*3/uL — AB (ref 4.0–10.3)
nRBC: 0 % (ref 0–0)

## 2013-09-13 LAB — COMPREHENSIVE METABOLIC PANEL (CC13)
ALK PHOS: 200 U/L — AB (ref 40–150)
ALT: 148 U/L — ABNORMAL HIGH (ref 0–55)
AST: 17 U/L (ref 5–34)
Albumin: 3.3 g/dL — ABNORMAL LOW (ref 3.5–5.0)
Anion Gap: 11 mEq/L (ref 3–11)
BUN: 14 mg/dL (ref 7.0–26.0)
CO2: 28 mEq/L (ref 22–29)
Calcium: 8.8 mg/dL (ref 8.4–10.4)
Chloride: 101 mEq/L (ref 98–109)
Creatinine: 0.6 mg/dL — ABNORMAL LOW (ref 0.7–1.3)
GLUCOSE: 123 mg/dL (ref 70–140)
POTASSIUM: 4.7 meq/L (ref 3.5–5.1)
Sodium: 140 mEq/L (ref 136–145)
TOTAL PROTEIN: 6.6 g/dL (ref 6.4–8.3)
Total Bilirubin: 0.4 mg/dL (ref 0.20–1.20)

## 2013-09-13 LAB — TECHNOLOGIST REVIEW

## 2013-09-13 MED ORDER — LIDOCAINE-PRILOCAINE 2.5-2.5 % EX CREA: 1.0000 "application " | TOPICAL_CREAM | CUTANEOUS | Status: DC | PRN

## 2013-09-13 MED ORDER — ONDANSETRON 16 MG/50ML IVPB (CHCC)
INTRAVENOUS | Status: AC
  Filled 2013-09-13: qty 16

## 2013-09-13 MED ORDER — DEXAMETHASONE SODIUM PHOSPHATE 20 MG/5ML IJ SOLN
20.0000 mg | Freq: Once | INTRAMUSCULAR | Status: AC
  Administered 2013-09-13: 20 mg via INTRAVENOUS

## 2013-09-13 MED ORDER — SODIUM CHLORIDE 0.9 % IV SOLN
507.0000 mg/m2 | Freq: Once | INTRAVENOUS | Status: AC
  Administered 2013-09-13: 1000 mg via INTRAVENOUS
  Filled 2013-09-13: qty 40

## 2013-09-13 MED ORDER — DEXAMETHASONE SODIUM PHOSPHATE 20 MG/5ML IJ SOLN
INTRAMUSCULAR | Status: AC
  Filled 2013-09-13: qty 5

## 2013-09-13 MED ORDER — SODIUM CHLORIDE 0.9 % IV SOLN
Freq: Once | INTRAVENOUS | Status: AC
  Administered 2013-09-13: 12:00:00 via INTRAVENOUS

## 2013-09-13 MED ORDER — SODIUM CHLORIDE 0.9 % IV SOLN
750.0000 mg | Freq: Once | INTRAVENOUS | Status: AC
  Administered 2013-09-13: 750 mg via INTRAVENOUS
  Filled 2013-09-13: qty 75

## 2013-09-13 MED ORDER — ONDANSETRON 16 MG/50ML IVPB (CHCC)
16.0000 mg | Freq: Once | INTRAVENOUS | Status: AC
  Administered 2013-09-13: 16 mg via INTRAVENOUS

## 2013-09-13 NOTE — Patient Instructions (Signed)
[facility] Discharge Instructions for Patients Receiving Chemotherapy  Today you received the following chemotherapy agents Alimta/Carboplatin  To help prevent nausea and vomiting after your treatment, we encourage you to take your nausea medication as prescribed.  If you develop nausea and vomiting that is not controlled by your nausea medication, call the clinic.   BELOW ARE SYMPTOMS THAT SHOULD BE REPORTED IMMEDIATELY:  *FEVER GREATER THAN 100.5 F  *CHILLS WITH OR WITHOUT FEVER  NAUSEA AND VOMITING THAT IS NOT CONTROLLED WITH YOUR NAUSEA MEDICATION  *UNUSUAL SHORTNESS OF BREATH  *UNUSUAL BRUISING OR BLEEDING  TENDERNESS IN MOUTH AND THROAT WITH OR WITHOUT PRESENCE OF ULCERS  *URINARY PROBLEMS  *BOWEL PROBLEMS  UNUSUAL RASH Items with * indicate a potential emergency and should be followed up as soon as possible.  Feel free to call the clinic you have any questions or concerns. The clinic phone number is [phone].    Pemetrexed injection (Alimta) What is this medicine? PEMETREXED (PEM e TREX ed) is a chemotherapy drug. This medicine affects cells that are rapidly growing, such as cancer cells and cells in your mouth and stomach. It is usually used to treat lung cancers like non-small cell lung cancer and mesothelioma. It may also be used to treat other cancers. This medicine may be used for other purposes; ask your health care provider or pharmacist if you have questions. COMMON BRAND NAME(S): Alimta What should I tell my health care provider before I take this medicine? They need to know if you have any of these conditions: -if you frequently drink alcohol containing beverages -infection (especially a virus infection such as chickenpox, cold sores, or herpes) -kidney disease -liver disease -low blood counts, like low platelets, red bloods, or white blood cells -an unusual or allergic reaction to pemetrexed, mannitol, other medicines, foods, dyes, or  preservatives -pregnant or trying to get pregnant -breast-feeding How should I use this medicine? This drug is given as an infusion into a vein. It is administered in a hospital or clinic by a specially trained health care professional. Talk to your pediatrician regarding the use of this medicine in children. Special care may be needed. Overdosage: If you think you have taken too much of this medicine contact a poison control center or emergency room at once. NOTE: This medicine is only for you. Do not share this medicine with others. What if I miss a dose? It is important not to miss your dose. Call your doctor or health care professional if you are unable to keep an appointment. What may interact with this medicine? -aspirin and aspirin-like medicines -medicines to increase blood counts like filgrastim, pegfilgrastim, sargramostim -methotrexate -NSAIDS, medicines for pain and inflammation, like ibuprofen or naproxen -probenecid -pyrimethamine -vaccines Talk to your doctor or health care professional before taking any of these medicines: -acetaminophen -aspirin -ibuprofen -ketoprofen -naproxen This list may not describe all possible interactions. Give your health care provider a list of all the medicines, herbs, non-prescription drugs, or dietary supplements you use. Also tell them if you smoke, drink alcohol, or use illegal drugs. Some items may interact with your medicine. What should I watch for while using this medicine? Visit your doctor for checks on your progress. This drug may make you feel generally unwell. This is not uncommon, as chemotherapy can affect healthy cells as well as cancer cells. Report any side effects. Continue your course of treatment even though you feel ill unless your doctor tells you to stop. In some cases, you  may be given additional medicines to help with side effects. Follow all directions for their use. Call your doctor or health care professional for  advice if you get a fever, chills or sore throat, or other symptoms of a cold or flu. Do not treat yourself. This drug decreases your body's ability to fight infections. Try to avoid being around people who are sick. This medicine may increase your risk to bruise or bleed. Call your doctor or health care professional if you notice any unusual bleeding. Be careful brushing and flossing your teeth or using a toothpick because you may get an infection or bleed more easily. If you have any dental work done, tell your dentist you are receiving this medicine. Avoid taking products that contain aspirin, acetaminophen, ibuprofen, naproxen, or ketoprofen unless instructed by your doctor. These medicines may hide a fever. Call your doctor or health care professional if you get diarrhea or mouth sores. Do not treat yourself. To protect your kidneys, drink water or other fluids as directed while you are taking this medicine. Men and women must use effective birth control while taking this medicine. You may also need to continue using effective birth control for a time after stopping this medicine. Do not become pregnant while taking this medicine. Tell your doctor right away if you think that you or your partner might be pregnant. There is a potential for serious side effects to an unborn child. Talk to your health care professional or pharmacist for more information. Do not breast-feed an infant while taking this medicine. This medicine may lower sperm counts. What side effects may I notice from receiving this medicine? Side effects that you should report to your doctor or health care professional as soon as possible: -allergic reactions like skin rash, itching or hives, swelling of the face, lips, or tongue -low blood counts - this medicine may decrease the number of white blood cells, red blood cells and platelets. You may be at increased risk for infections and bleeding. -signs of infection - fever or chills,  cough, sore throat, pain or difficulty passing urine -signs of decreased platelets or bleeding - bruising, pinpoint red spots on the skin, black, tarry stools, blood in the urine -signs of decreased red blood cells - unusually weak or tired, fainting spells, lightheadedness -breathing problems, like a dry cough -changes in emotions or moods -chest pain -confusion -diarrhea -high blood pressure -mouth or throat sores or ulcers -pain, swelling, warmth in the leg -pain on swallowing -swelling of the ankles, feet, hands -trouble passing urine or change in the amount of urine -vomiting -yellowing of the eyes or skin Side effects that usually do not require medical attention (report to your doctor or health care professional if they continue or are bothersome): -hair loss -loss of appetite -nausea -stomach upset This list may not describe all possible side effects. Call your doctor for medical advice about side effects. You may report side effects to FDA at 1-800-FDA-1088. Where should I keep my medicine? This drug is given in a hospital or clinic and will not be stored at home. NOTE: This sheet is a summary. It may not cover all possible information. If you have questions about this medicine, talk to your doctor, pharmacist, or health care provider.  2014, Elsevier/Gold Standard. (2007-12-28 13:24:03)   Carboplatin injection What is this medicine? CARBOPLATIN (KAR boe pla tin) is a chemotherapy drug. It targets fast dividing cells, like cancer cells, and causes these cells to die. This medicine is used  to treat ovarian cancer and many other cancers. This medicine may be used for other purposes; ask your health care provider or pharmacist if you have questions. COMMON BRAND NAME(S): Paraplatin What should I tell my health care provider before I take this medicine? They need to know if you have any of these conditions: -blood disorders -hearing problems -kidney disease -recent or  ongoing radiation therapy -an unusual or allergic reaction to carboplatin, cisplatin, other chemotherapy, other medicines, foods, dyes, or preservatives -pregnant or trying to get pregnant -breast-feeding How should I use this medicine? This drug is usually given as an infusion into a vein. It is administered in a hospital or clinic by a specially trained health care professional. Talk to your pediatrician regarding the use of this medicine in children. Special care may be needed. Overdosage: If you think you have taken too much of this medicine contact a poison control center or emergency room at once. NOTE: This medicine is only for you. Do not share this medicine with others. What if I miss a dose? It is important not to miss a dose. Call your doctor or health care professional if you are unable to keep an appointment. What may interact with this medicine? -medicines for seizures -medicines to increase blood counts like filgrastim, pegfilgrastim, sargramostim -some antibiotics like amikacin, gentamicin, neomycin, streptomycin, tobramycin -vaccines Talk to your doctor or health care professional before taking any of these medicines: -acetaminophen -aspirin -ibuprofen -ketoprofen -naproxen This list may not describe all possible interactions. Give your health care provider a list of all the medicines, herbs, non-prescription drugs, or dietary supplements you use. Also tell them if you smoke, drink alcohol, or use illegal drugs. Some items may interact with your medicine. What should I watch for while using this medicine? Your condition will be monitored carefully while you are receiving this medicine. You will need important blood work done while you are taking this medicine. This drug may make you feel generally unwell. This is not uncommon, as chemotherapy can affect healthy cells as well as cancer cells. Report any side effects. Continue your course of treatment even though you feel ill  unless your doctor tells you to stop. In some cases, you may be given additional medicines to help with side effects. Follow all directions for their use. Call your doctor or health care professional for advice if you get a fever, chills or sore throat, or other symptoms of a cold or flu. Do not treat yourself. This drug decreases your body's ability to fight infections. Try to avoid being around people who are sick. This medicine may increase your risk to bruise or bleed. Call your doctor or health care professional if you notice any unusual bleeding. Be careful brushing and flossing your teeth or using a toothpick because you may get an infection or bleed more easily. If you have any dental work done, tell your dentist you are receiving this medicine. Avoid taking products that contain aspirin, acetaminophen, ibuprofen, naproxen, or ketoprofen unless instructed by your doctor. These medicines may hide a fever. Do not become pregnant while taking this medicine. Women should inform their doctor if they wish to become pregnant or think they might be pregnant. There is a potential for serious side effects to an unborn child. Talk to your health care professional or pharmacist for more information. Do not breast-feed an infant while taking this medicine. What side effects may I notice from receiving this medicine? Side effects that you should report to your doctor  or health care professional as soon as possible: -allergic reactions like skin rash, itching or hives, swelling of the face, lips, or tongue -signs of infection - fever or chills, cough, sore throat, pain or difficulty passing urine -signs of decreased platelets or bleeding - bruising, pinpoint red spots on the skin, black, tarry stools, nosebleeds -signs of decreased red blood cells - unusually weak or tired, fainting spells, lightheadedness -breathing problems -changes in hearing -changes in vision -chest pain -high blood pressure -low  blood counts - This drug may decrease the number of white blood cells, red blood cells and platelets. You may be at increased risk for infections and bleeding. -nausea and vomiting -pain, swelling, redness or irritation at the injection site -pain, tingling, numbness in the hands or feet -problems with balance, talking, walking -trouble passing urine or change in the amount of urine Side effects that usually do not require medical attention (report to your doctor or health care professional if they continue or are bothersome): -hair loss -loss of appetite -metallic taste in the mouth or changes in taste This list may not describe all possible side effects. Call your doctor for medical advice about side effects. You may report side effects to FDA at 1-800-FDA-1088. Where should I keep my medicine? This drug is given in a hospital or clinic and will not be stored at home. NOTE: This sheet is a summary. It may not cover all possible information. If you have questions about this medicine, talk to your doctor, pharmacist, or health care provider.  2014, Elsevier/Gold Standard. (2007-08-31 14:38:05)

## 2013-09-13 NOTE — Progress Notes (Signed)
Ok to treat with Creatinine 0.6 per Dr. Julien Nordmann.

## 2013-09-13 NOTE — Telephone Encounter (Signed)
Late Entry 09/12/13:  Pt's mother and patient were in pt education 09/12/13 with several questions.  Pt has marijuana at home and pt's mother took it away.  Pt was very upset about this.  They want to know if Dr Vista Mink feels it is okay for pt to keep using marijuana.  Per dr Vista Mink, he should not continue to use it.  Pt is interested in a port.  Per dr Vista Mink, okay to have port ordered with IR before 2nd cycle of chemo.  Port order placed along with EMLA cream.  Pt's mother is also wanting to know CT results of the head.  Per Dr Vista Mink, it was negative.  SLJ

## 2013-09-13 NOTE — Progress Notes (Signed)
In chemo orientation during the support center focus time, patient denied needing emotional support and said he only wanted to "do the treatment" and be done with the [place]. His mother with him showed some concern. Chaplain de-escalated the situation and assured patient that he would get the information that he needed. His mother showed interest in coming to the caregiver support workshop and support group.   Epifania Gore, Bonney Roussel, [facility], PhD

## 2013-09-14 ENCOUNTER — Telehealth: Payer: Self-pay | Admitting: *Deleted

## 2013-09-14 ENCOUNTER — Other Ambulatory Visit: Payer: Self-pay | Admitting: *Deleted

## 2013-09-14 DIAGNOSIS — C3490 Malignant neoplasm of unspecified part of unspecified bronchus or lung: Secondary | ICD-10-CM

## 2013-09-14 MED ORDER — OXYCODONE HCL 10 MG PO TABS: 10.0000 mg | ORAL_TABLET | ORAL | Status: DC | PRN

## 2013-09-14 MED ORDER — NICOTINE 21 MG/24HR TD PT24
21.0000 mg | MEDICATED_PATCH | Freq: Every day | TRANSDERMAL | Status: DC
Start: 2013-09-14 — End: 2013-09-22

## 2013-09-14 NOTE — Telephone Encounter (Signed)
Called [name] for chemotherapy F/U.  Patient is doing well, "I'm just having pain".  Noted a phone note from collaborative nurse in reference to pain.  [name] [name] denies sleeping well last night.  Can't say if it is or osn't related to pain.  Has taken the oxycodone as he always does.  Denies n/v.  Denies any new side effects or symptoms.  Bowel and bladder is functioning well.  Eating and drinking well and I instructed to drink 64 oz minimum daily or at least the day before, of and after treatment.  Denies questions at this time and encouraged to call if needed.  Reviewed how to call after hours in the case of an emergency.

## 2013-09-14 NOTE — Telephone Encounter (Signed)
Pt's mother called stating that pt is having very severe pain in his shoulder and back.  He is also not sleeping well.  Per Dr Vista Mink, pt needs to contact radiation due to increased pain in his back and shoulder.  Pt recently completed radiation to his back.  For sleep, per Dr Vista Mink, pt can take benadryl OTC.  Pt also requested nicotene patch.  Order sent to pt's pharmacy.  Pt's mother verbalized understanding.  SLJ

## 2013-09-14 NOTE — Telephone Encounter (Signed)
Message copied by Cherylynn Ridges on Wed Sep 14, 2013 11:39 AM ------      Message from: Perlie Gold      Created: Tue Sep 13, 2013  1:15 PM      Regarding: Chemo Follow up call      Contact: [phone]       First time Alimta/Carboplatin. Dr. Julien Nordmann patient. Please call.            Thanks,      Catherine,RN ------

## 2013-09-15 ENCOUNTER — Ambulatory Visit (HOSPITAL_COMMUNITY)
Admission: RE | Admit: 2013-09-15 | Discharge: 2013-09-15 | Disposition: A | Discharge status: Home / Self Care | Payer: Medicare Other | Source: Ambulatory Visit | Attending: Internal Medicine | Admitting: Internal Medicine

## 2013-09-15 ENCOUNTER — Other Ambulatory Visit: Payer: Self-pay | Admitting: *Deleted

## 2013-09-15 ENCOUNTER — Telehealth: Payer: Self-pay | Admitting: *Deleted

## 2013-09-15 ENCOUNTER — Encounter: Payer: Self-pay | Admitting: *Deleted

## 2013-09-15 DIAGNOSIS — M7989 Other specified soft tissue disorders: Secondary | ICD-10-CM

## 2013-09-15 DIAGNOSIS — J449 Chronic obstructive pulmonary disease, unspecified: Secondary | ICD-10-CM | POA: Insufficient documentation

## 2013-09-15 DIAGNOSIS — C342 Malignant neoplasm of middle lobe, bronchus or lung: Secondary | ICD-10-CM | POA: Insufficient documentation

## 2013-09-15 DIAGNOSIS — F17200 Nicotine dependence, unspecified, uncomplicated: Secondary | ICD-10-CM

## 2013-09-15 DIAGNOSIS — J4489 Other specified chronic obstructive pulmonary disease: Secondary | ICD-10-CM | POA: Insufficient documentation

## 2013-09-15 DIAGNOSIS — M79609 Pain in unspecified limb: Secondary | ICD-10-CM | POA: Insufficient documentation

## 2013-09-15 NOTE — Telephone Encounter (Signed)
Pt called on call MD last night regarding LE swelling.  Pt called this morning to speak to desk RN regarding his LE swelling.  He stated the right is worse then the left.  When he elevated his legs last night the swelling did decrease.  Per AJ, bilateral doppler studies to r/o DVT.  Pt is aware of time to arrive today.  SLJ

## 2013-09-15 NOTE — Telephone Encounter (Signed)
Called pt, left vm informing pt Dr Valere Dross will be glad to see him for early FU next Tues, April 14th to assess his pain. Per Dr Valere Dross, for pain management, pt needs to contact Dr Worthy Flank office. Advised pt of this in voice mail. Left call back name, number for pt to schedule a FU w/Dr Valere Dross on 09/20/13.

## 2013-09-15 NOTE — Progress Notes (Signed)
Bilateral lower extremity venous duplex:  No evidence of DVT, superficial thrombosis, or Baker's Cyst.

## 2013-09-15 NOTE — Telephone Encounter (Addendum)
Received vm from pt's mother [name] [name]. She states pt received radiation treatment for back pain per Dr Valere Dross. She states he continues to have lower back pain. Spoke w/pt who states he has right shoulder pain that "began yesterday after chemotherapy". He states he continues to have lower back pain "maybe slightly above his sacrum" where he received radiation treatments last month. Pt states he is taking Oxycontin 60 mg every 12 hours, Oxy-IR 10 mg every 3 hours. He receives good relief from shoulder pain but states lower back pain "never goes away". He states repositioning does not give relief. He states he "is wondering if he needs stronger pain medication". Pt has reported this pain to Dr Julien Nordmann who recommended he discuss w/Dr Valere Dross. Informed pt will relay this information to Dr Valere Dross and call him back today w/Dr Murray's response. Pt verbalized understanding. Pt scheduled for doppler study today at [facility], 12:45 pm for lower leg swelling, per Awilda Metro, PA, med onc.

## 2013-09-15 NOTE — Telephone Encounter (Signed)
Received call from vascular lab.  His doppler was negative for DVT.  SLJ

## 2013-09-15 NOTE — Telephone Encounter (Signed)
I can see patient for follow-up, but may need to have patient see pain clinic unless he responds to chemo in near future.

## 2013-09-15 NOTE — Progress Notes (Signed)
[place] Social Work  Clinical Social Work was referred by patient/family to review and complete healthcare advance directives.  Clinical Social Worker met with patient and patient's mother in infusion room.  The patient designated mother [name] [name] as their primary healthcare agent and [name] [name] as their secondary agent.  Patient also completed healthcare living will.  The patient had no questions regarding his advance directives.  Clinical Social Worker notarized documents and made copies for patient/family. Clinical Social Worker will send documents to medical records to be scanned into patient's medical chart. Clinical Social Worker encouraged patient/family to contact with any additional questions or concerns.  [name] [name], MSW, [place] Worker [facility] [phone]

## 2013-09-16 ENCOUNTER — Telehealth: Payer: Self-pay | Admitting: *Deleted

## 2013-09-16 NOTE — Telephone Encounter (Signed)
error 

## 2013-09-19 ENCOUNTER — Encounter: Payer: Self-pay | Admitting: Internal Medicine

## 2013-09-20 ENCOUNTER — Ambulatory Visit (HOSPITAL_BASED_OUTPATIENT_CLINIC_OR_DEPARTMENT_OTHER): Payer: Medicare Other | Admitting: Physician Assistant

## 2013-09-20 ENCOUNTER — Encounter: Payer: Self-pay | Admitting: Physician Assistant

## 2013-09-20 ENCOUNTER — Telehealth: Payer: Self-pay | Admitting: Internal Medicine

## 2013-09-20 ENCOUNTER — Other Ambulatory Visit (HOSPITAL_BASED_OUTPATIENT_CLINIC_OR_DEPARTMENT_OTHER): Payer: Medicare Other

## 2013-09-20 VITALS — BP 115/64 | HR 102 | Temp 97.7°F | Resp 17 | Ht 69.0 in | Wt 204.4 lb

## 2013-09-20 DIAGNOSIS — C7951 Secondary malignant neoplasm of bone: Secondary | ICD-10-CM

## 2013-09-20 DIAGNOSIS — C342 Malignant neoplasm of middle lobe, bronchus or lung: Secondary | ICD-10-CM

## 2013-09-20 DIAGNOSIS — C7952 Secondary malignant neoplasm of bone marrow: Secondary | ICD-10-CM

## 2013-09-20 DIAGNOSIS — C3490 Malignant neoplasm of unspecified part of unspecified bronchus or lung: Secondary | ICD-10-CM

## 2013-09-20 DIAGNOSIS — C799 Secondary malignant neoplasm of unspecified site: Secondary | ICD-10-CM

## 2013-09-20 DIAGNOSIS — G893 Neoplasm related pain (acute) (chronic): Secondary | ICD-10-CM

## 2013-09-20 LAB — COMPREHENSIVE METABOLIC PANEL (CC13)
ALT: 132 U/L — AB (ref 0–55)
ANION GAP: 10 meq/L (ref 3–11)
AST: 17 U/L (ref 5–34)
Albumin: 3.3 g/dL — ABNORMAL LOW (ref 3.5–5.0)
Alkaline Phosphatase: 151 U/L — ABNORMAL HIGH (ref 40–150)
BUN: 11.9 mg/dL (ref 7.0–26.0)
CO2: 26 meq/L (ref 22–29)
CREATININE: 0.6 mg/dL — AB (ref 0.7–1.3)
Calcium: 8.2 mg/dL — ABNORMAL LOW (ref 8.4–10.4)
Chloride: 102 mEq/L (ref 98–109)
Glucose: 152 mg/dl — ABNORMAL HIGH (ref 70–140)
Potassium: 4.3 mEq/L (ref 3.5–5.1)
Sodium: 137 mEq/L (ref 136–145)
Total Bilirubin: 0.47 mg/dL (ref 0.20–1.20)
Total Protein: 6.2 g/dL — ABNORMAL LOW (ref 6.4–8.3)

## 2013-09-20 LAB — CBC WITH DIFFERENTIAL/PLATELET
BASO%: 0 % (ref 0.0–2.0)
Basophils Absolute: 0 10*3/uL (ref 0.0–0.1)
EOS ABS: 1.1 10*3/uL — AB (ref 0.0–0.5)
EOS%: 19.9 % — AB (ref 0.0–7.0)
HCT: 36 % — ABNORMAL LOW (ref 38.4–49.9)
HGB: 11.5 g/dL — ABNORMAL LOW (ref 13.0–17.1)
LYMPH%: 5.4 % — ABNORMAL LOW (ref 14.0–49.0)
MCH: 28.6 pg (ref 27.2–33.4)
MCHC: 31.9 g/dL — ABNORMAL LOW (ref 32.0–36.0)
MCV: 89.6 fL (ref 79.3–98.0)
MONO#: 0.2 10*3/uL (ref 0.1–0.9)
MONO%: 3.3 % (ref 0.0–14.0)
NEUT%: 71.4 % (ref 39.0–75.0)
NEUTROS ABS: 3.9 10*3/uL (ref 1.5–6.5)
NRBC: 0 % (ref 0–0)
PLATELETS: 93 10*3/uL — AB (ref 140–400)
RBC: 4.02 10*6/uL — AB (ref 4.20–5.82)
RDW: 19 % — ABNORMAL HIGH (ref 11.0–14.6)
WBC: 5.4 10*3/uL (ref 4.0–10.3)
lymph#: 0.3 10*3/uL — ABNORMAL LOW (ref 0.9–3.3)

## 2013-09-20 MED ORDER — DEXAMETHASONE 4 MG PO TABS: 4.0000 mg | ORAL_TABLET | Freq: Two times a day (BID) | ORAL | Status: DC

## 2013-09-20 MED ORDER — GABAPENTIN 100 MG PO CAPS: ORAL_CAPSULE | ORAL | Status: DC

## 2013-09-20 MED ORDER — FENTANYL 50 MCG/HR TD PT72: 50.0000 ug | MEDICATED_PATCH | TRANSDERMAL | Status: DC

## 2013-09-20 MED ORDER — OXYCODONE HCL 10 MG PO TABS: ORAL_TABLET | ORAL | Status: DC
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
## 2013-09-20 NOTE — Telephone Encounter (Signed)
gv and printed aptp sched and avs for pt for April adn May;.....sed added tx.

## 2013-09-21 ENCOUNTER — Encounter: Payer: Self-pay | Admitting: *Deleted

## 2013-09-21 NOTE — Telephone Encounter (Signed)
Weight increased.  RD available as needed.  Antonieta Iba, RD, LDN

## 2013-09-22 ENCOUNTER — Encounter (HOSPITAL_COMMUNITY): Payer: Self-pay | Admitting: Pharmacy Technician

## 2013-09-22 ENCOUNTER — Encounter: Payer: Self-pay | Admitting: Physician Assistant

## 2013-09-22 ENCOUNTER — Telehealth: Payer: Self-pay | Admitting: Medical Oncology

## 2013-09-22 NOTE — Patient Instructions (Signed)
For now continue the OxyContin 60 mg by mouth every 12 hours, we are adding a Duragesic 50 mcg patch to be changed every 3 days, you should only take your OxyIR 10 mg, one tablet every 4-6 hours as needed for breakthrough pain. We're going to continue on the dexamethasone to 4 mg by mouth twice daily for another 2 weeks and are adding gabapentin at 200 mg by mouth 3 times daily. Continue weekly labs as scheduled Followup in 2 weeks prior to your next scheduled cycle of chemotherapy

## 2013-09-22 NOTE — Telephone Encounter (Signed)
Dental clearance from Dr Vicente Serene , DDS for "no contraindications to future medical treatment" ,letter to dr Julien Nordmann,.

## 2013-09-26 ENCOUNTER — Other Ambulatory Visit: Payer: Self-pay | Admitting: Radiology

## 2013-09-27 ENCOUNTER — Ambulatory Visit
Admit: 2013-09-27 | Discharge: 2013-09-27 | Disposition: A | Discharge status: Home / Self Care | Payer: Medicare Other | Attending: Radiation Oncology | Admitting: Radiation Oncology

## 2013-09-27 ENCOUNTER — Encounter: Payer: Self-pay | Admitting: Internal Medicine

## 2013-09-27 ENCOUNTER — Other Ambulatory Visit (HOSPITAL_BASED_OUTPATIENT_CLINIC_OR_DEPARTMENT_OTHER): Payer: Medicare Other

## 2013-09-27 ENCOUNTER — Encounter: Payer: Self-pay | Admitting: Radiation Oncology

## 2013-09-27 VITALS — BP 142/79 | HR 112 | Temp 97.6°F | Resp 20 | Wt 213.4 lb

## 2013-09-27 DIAGNOSIS — C342 Malignant neoplasm of middle lobe, bronchus or lung: Secondary | ICD-10-CM

## 2013-09-27 DIAGNOSIS — C7951 Secondary malignant neoplasm of bone: Secondary | ICD-10-CM

## 2013-09-27 DIAGNOSIS — C3490 Malignant neoplasm of unspecified part of unspecified bronchus or lung: Secondary | ICD-10-CM

## 2013-09-27 LAB — COMPREHENSIVE METABOLIC PANEL (CC13)
ALBUMIN: 3.2 g/dL — AB (ref 3.5–5.0)
ALT: 143 U/L — ABNORMAL HIGH (ref 0–55)
ANION GAP: 13 meq/L — AB (ref 3–11)
AST: 21 U/L (ref 5–34)
Alkaline Phosphatase: 172 U/L — ABNORMAL HIGH (ref 40–150)
BUN: 8.4 mg/dL (ref 7.0–26.0)
CALCIUM: 8.3 mg/dL — AB (ref 8.4–10.4)
CHLORIDE: 101 meq/L (ref 98–109)
CO2: 26 meq/L (ref 22–29)
CREATININE: 0.6 mg/dL — AB (ref 0.7–1.3)
GLUCOSE: 122 mg/dL (ref 70–140)
POTASSIUM: 4.3 meq/L (ref 3.5–5.1)
Sodium: 140 mEq/L (ref 136–145)
Total Bilirubin: 0.3 mg/dL (ref 0.20–1.20)
Total Protein: 6.2 g/dL — ABNORMAL LOW (ref 6.4–8.3)

## 2013-09-27 LAB — CBC WITH DIFFERENTIAL/PLATELET
BASO%: 1.7 % (ref 0.0–2.0)
Basophils Absolute: 0.1 10*3/uL (ref 0.0–0.1)
EOS ABS: 0.4 10*3/uL (ref 0.0–0.5)
EOS%: 7.6 % — ABNORMAL HIGH (ref 0.0–7.0)
HCT: 35.9 % — ABNORMAL LOW (ref 38.4–49.9)
HEMOGLOBIN: 11.1 g/dL — AB (ref 13.0–17.1)
LYMPH#: 0.7 10*3/uL — AB (ref 0.9–3.3)
LYMPH%: 15.3 % (ref 14.0–49.0)
MCH: 28.2 pg (ref 27.2–33.4)
MCHC: 30.9 g/dL — ABNORMAL LOW (ref 32.0–36.0)
MCV: 91.3 fL (ref 79.3–98.0)
MONO#: 0.5 10*3/uL (ref 0.1–0.9)
MONO%: 9.5 % (ref 0.0–14.0)
NEUT%: 65.9 % (ref 39.0–75.0)
NEUTROS ABS: 3.2 10*3/uL (ref 1.5–6.5)
Platelets: 138 10*3/uL — ABNORMAL LOW (ref 140–400)
RBC: 3.93 10*6/uL — ABNORMAL LOW (ref 4.20–5.82)
RDW: 20.4 % — AB (ref 11.0–14.6)
WBC: 4.8 10*3/uL (ref 4.0–10.3)

## 2013-09-27 LAB — TECHNOLOGIST REVIEW

## 2013-09-27 NOTE — Progress Notes (Signed)
CC: Dr. Curt Bears  Mr. 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 He states that he still has persistent left pelvis/hip discomfort. His pain is worse with movement. He also reports worsening right shoulder pain as well. He is currently on a 50 mcg Duragesic patch and also OxyContin 60 mg by mouth twice a day in addition to oxycodone 10 mg every 6 hours when necessary breakthrough pain. He was taking the oxycodone every 3 hours, and is now to take this no more often than every 6 hours. He began chemotherapy approximately 2 weeks ago under the direction Dr. Julien Nordmann. He does have occasional productive cough of clear phlegm. He denies dyspnea.  Physical examination: Alert and oriented. Filed Vitals:   09/27/13 1107  BP: 142/79  Pulse: 112  Temp: 97.6 F (36.4 C)  Resp: 20   Head and neck examination: Grossly unremarkable. Nodes: Without palpable cervical or supraclavicular lymphadenopathy.: Lungs clear. Back: There is marked tenderness on palpation along his left SI joint. Extremities: Without edema. Neurologic examination: Grossly nonfocal.  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 I reviewed the patient's CT scans in his x-rays with him in his mother. He has an unstable pelvis from lytic disease along his left SI joint. I do not see evidence for lytic disease along his right shoulder/scapula on CT or plain films.  Plan: I told the patient that he should continue with chemotherapy as scheduled by Dr. Julien Nordmann to treat all of his widespread metastatic disease. He received a reasonable dose of radiation therapy to his sacrum  and left pelvis, and his persistent pain is most likely secondary to the fact that he has an unstable pelvis causing pain with movement. Dr. Julien Nordmann may need to increase his fentanyl patch or OxyContin. I've not scheduled the patient for a formal followup visit, but I would more than happy to see him in the future should the need arise.

## 2013-09-27 NOTE — Progress Notes (Signed)
Pt c/o pain 8/10 in his lower back and right hip. He also c/o pain in his right shoulder. Pt wearuing Duragesic patch 50 mcg, takes Oxycontin 60 mg every 12 hours and Oxycodone 10 mg every 4-6 hours regularly.  He states "ever since I put on the patch and they changed my pain medicine my pain has gotten worse". Pt states he has occasional productive cough w/clear phlegm, denies SOB, states he is very inactive.

## 2013-09-28 ENCOUNTER — Other Ambulatory Visit: Payer: Self-pay | Admitting: Internal Medicine

## 2013-09-28 ENCOUNTER — Telehealth: Payer: Self-pay | Admitting: *Deleted

## 2013-09-28 ENCOUNTER — Ambulatory Visit (HOSPITAL_COMMUNITY)
Admission: RE | Admit: 2013-09-28 | Discharge: 2013-09-28 | Disposition: A | Discharge status: Home / Self Care | Payer: Medicare Other | Source: Ambulatory Visit | Attending: Internal Medicine | Admitting: Internal Medicine

## 2013-09-28 ENCOUNTER — Encounter (HOSPITAL_COMMUNITY): Payer: Self-pay

## 2013-09-28 ENCOUNTER — Other Ambulatory Visit: Payer: Self-pay | Admitting: *Deleted

## 2013-09-28 DIAGNOSIS — C342 Malignant neoplasm of middle lobe, bronchus or lung: Secondary | ICD-10-CM

## 2013-09-28 DIAGNOSIS — C3490 Malignant neoplasm of unspecified part of unspecified bronchus or lung: Secondary | ICD-10-CM | POA: Insufficient documentation

## 2013-09-28 LAB — CBC WITH DIFFERENTIAL/PLATELET
BASOS ABS: 0.2 10*3/uL — AB (ref 0.0–0.1)
Basophils Relative: 3 % — ABNORMAL HIGH (ref 0–1)
EOS PCT: 6 % — AB (ref 0–5)
Eosinophils Absolute: 0.3 10*3/uL (ref 0.0–0.7)
HCT: 35.1 % — ABNORMAL LOW (ref 39.0–52.0)
Hemoglobin: 11.3 g/dL — ABNORMAL LOW (ref 13.0–17.0)
LYMPHS ABS: 0.5 10*3/uL — AB (ref 0.7–4.0)
Lymphocytes Relative: 9 % — ABNORMAL LOW (ref 12–46)
MCH: 29.1 pg (ref 26.0–34.0)
MCHC: 32.2 g/dL (ref 30.0–36.0)
MCV: 90.5 fL (ref 78.0–100.0)
MONO ABS: 1 10*3/uL (ref 0.1–1.0)
MONOS PCT: 17 % — AB (ref 3–12)
NEUTROS PCT: 65 % (ref 43–77)
Neutro Abs: 3.7 10*3/uL (ref 1.7–7.7)
Platelets: 167 10*3/uL (ref 150–400)
RBC: 3.88 MIL/uL — AB (ref 4.22–5.81)
RDW: 20.5 % — ABNORMAL HIGH (ref 11.5–15.5)
WBC: 5.7 10*3/uL (ref 4.0–10.5)

## 2013-09-28 LAB — APTT: aPTT: 22 seconds — ABNORMAL LOW (ref 24–37)

## 2013-09-28 LAB — PROTIME-INR
INR: 0.84 (ref 0.00–1.49)
Prothrombin Time: 11.4 seconds — ABNORMAL LOW (ref 11.6–15.2)

## 2013-09-28 MED ORDER — HEPARIN SOD (PORK) LOCK FLUSH 100 UNIT/ML IV SOLN
INTRAVENOUS | Status: AC
  Filled 2013-09-28: qty 5

## 2013-09-28 MED ORDER — HEPARIN SOD (PORK) LOCK FLUSH 100 UNIT/ML IV SOLN
INTRAVENOUS | Status: AC | PRN
  Administered 2013-09-28: 500 [IU]

## 2013-09-28 MED ORDER — OXYCODONE HCL ER 60 MG PO T12A: 1.0000 | EXTENDED_RELEASE_TABLET | Freq: Two times a day (BID) | ORAL | Status: DC

## 2013-09-28 MED ORDER — FENTANYL CITRATE 0.05 MG/ML IJ SOLN
INTRAMUSCULAR | Status: AC | PRN
  Administered 2013-09-28 (×4): 25 ug via INTRAVENOUS

## 2013-09-28 MED ORDER — MIDAZOLAM HCL 2 MG/2ML IJ SOLN
INTRAMUSCULAR | Status: AC | PRN
  Administered 2013-09-28: 1 mg via INTRAVENOUS
  Administered 2013-09-28 (×6): 0.5 mg via INTRAVENOUS

## 2013-09-28 MED ORDER — CEFAZOLIN SODIUM-DEXTROSE 2-3 GM-% IV SOLR
2.0000 g | Freq: Once | INTRAVENOUS | Status: AC
  Administered 2013-09-28: 2 g via INTRAVENOUS
  Filled 2013-09-28: qty 50

## 2013-09-28 MED ORDER — MIDAZOLAM HCL 2 MG/2ML IJ SOLN
INTRAMUSCULAR | Status: AC
  Filled 2013-09-28: qty 6

## 2013-09-28 MED ORDER — SODIUM CHLORIDE 0.9 % IV SOLN
INTRAVENOUS | Status: DC
  Administered 2013-09-28: 500 mL via INTRAVENOUS

## 2013-09-28 MED ORDER — FENTANYL CITRATE 0.05 MG/ML IJ SOLN
INTRAMUSCULAR | Status: AC
  Filled 2013-09-28: qty 6
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
## 2013-09-28 NOTE — Telephone Encounter (Signed)
Received message from pt's mother, [name] [name] stating pt wants to know what to do to obtain a lift chair. Left voice mail w/[name] [name], SW requesting assistance. Spoke w/[name], Music therapist who stated pt needs to contact [employer] who should be able to help pt fill out necessary Medicare/Medicaid paperwork to obtain lift chair. She stated DME staff should be able to inform pt of any requirements necessary to obtain the chair. Called [name] and informed her of [name]'s information. Gave her name and number of DME in [place], [place] and [facility] [phone]. [name] verbalized understanding, states she knows where this store is located and thanked this RN for the call back and information. Encouraged her to call back if pt has any further needs.

## 2013-09-28 NOTE — Procedures (Signed)
Placement of right jugular portacath.  Tip at superior cavoatrial junction and ready to use.  No immediate complication.

## 2013-09-28 NOTE — Discharge Instructions (Signed)
Implanted Port Home Guide °An implanted port is a type of central line that is placed under the skin. Central lines are used to provide IV access when treatment or nutrition needs to be given through a person's veins. Implanted ports are used for long-term IV access. An implanted port may be placed because:  °· You need IV medicine that would be irritating to the small veins in your hands or arms.   °· You need long-term IV medicines, such as antibiotics.   °· You need IV nutrition for a long period.   °· You need frequent blood draws for lab tests.   °· You need dialysis.   °Implanted ports are usually placed in the chest area, but they can also be placed in the upper arm, the abdomen, or the leg. An implanted port has two main parts:  °· Reservoir. The reservoir is round and will appear as a small, raised area under your skin. The reservoir is the part where a needle is inserted to give medicines or draw blood.   °· Catheter. The catheter is a thin, flexible tube that extends from the reservoir. The catheter is placed into a large vein. Medicine that is inserted into the reservoir goes into the catheter and then into the vein.   °HOW WILL I CARE FOR MY INCISION SITE? °Do not get the incision site wet. Bathe or shower as directed by your health care provider.  °HOW IS MY PORT ACCESSED? °Special steps must be taken to access the port:  °· Before the port is accessed, a numbing cream can be placed on the skin. This helps numb the skin over the port site.   °· Your health care provider uses a sterile technique to access the port. °· Your health care provider must put on a mask and sterile gloves. °· The skin over your port is cleaned carefully with an antiseptic and allowed to dry. °· The port is gently pinched between sterile gloves, and a needle is inserted into the port. °· Only "non-coring" port needles should be used to access the port. Once the port is accessed, a blood return should be checked. This helps  ensure that the port is in the vein and is not clogged.   °· If your port needs to remain accessed for a constant infusion, a clear (transparent) bandage will be placed over the needle site. The bandage and needle will need to be changed every week, or as directed by your health care provider.   °· Keep the bandage covering the needle clean and dry. Do not get it wet. Follow your health care provider's instructions on how to take a shower or bath while the port is accessed.   °· If your port does not need to stay accessed, no bandage is needed over the port.   °WHAT IS FLUSHING? °Flushing helps keep the port from getting clogged. Follow your health care provider's instructions on how and when to flush the port. Ports are usually flushed with saline solution or a medicine called heparin. The need for flushing will depend on how the port is used.  °· If the port is used for intermittent medicines or blood draws, the port will need to be flushed:   °· After medicines have been given.   °· After blood has been drawn.   °· As part of routine maintenance.   °· If a constant infusion is running, the port may not need to be flushed.   °HOW LONG WILL MY PORT STAY IMPLANTED? °The port can stay in for as long as your health care   provider thinks it is needed. When it is time for the port to come out, surgery will be done to remove it. The procedure is similar to the one performed when the port was put in.  °WHEN SHOULD I SEEK IMMEDIATE MEDICAL CARE? °When you have an implanted port, you should seek immediate medical care if:  °· You notice a bad smell coming from the incision site.   °· You have swelling, redness, or drainage at the incision site.   °· You have more swelling or pain at the port site or the surrounding area.   °· You have a fever that is not controlled with medicine. °Document Released: 05/26/2005 Document Revised: 03/16/2013 Document Reviewed: 01/31/2013 °ExitCare® Patient Information ©2014 [employer]  [employer]. °Moderate Sedation, Adult °Moderate sedation is given to help you relax or even sleep through a procedure. You may remain sleepy, be clumsy, or have poor balance for several hours following this procedure. Arrange for a responsible adult, family member, or friend to take you home. A responsible adult should stay with you for at least 24 hours or until the medicines have worn off. °· Do not participate in any activities where you could become injured for the next 24 hours, or until you feel normal again. Do not: °· Drive. °· Swim. °· Ride a bicycle. °· Operate heavy machinery. °· Cook. °· Use power tools. °· Climb ladders. °· Work at heights. °· Do not make important decisions or sign legal documents until you are improved. °· Vomiting may occur if you eat too soon. When you can drink without vomiting, try water, juice, or soup. Try solid foods if you feel little or no nausea. °· Only take over-the-counter or prescription medications for pain, discomfort, or fever as directed by your caregiver.If pain medications have been prescribed for you, ask your caregiver how soon it is safe to take them. °· Make sure you and your family fully understands everything about the medication given to you. Make sure you understand what side effects may occur. °· You should not drink alcohol, take sleeping pills, or medications that cause drowsiness for at least 24 hours. °· If you smoke, do not smoke alone. °· If you are feeling better, you may resume normal activities 24 hours after receiving sedation. °· Keep all appointments as scheduled. Follow all instructions. °· Ask questions if you do not understand. °SEEK MEDICAL CARE IF:  °· Your skin is pale or bluish in color. °· You continue to feel sick to your stomach (nauseous) or throw up (vomit). °· Your pain is getting worse and not helped by medication. °· You have bleeding or swelling. °· You are still sleepy or feeling clumsy after 24 hours. °SEEK IMMEDIATE MEDICAL CARE IF:   °· You develop a rash. °· You have difficulty breathing. °· You develop any type of allergic problem. °· You have a fever. °Document Released: 02/18/2001 Document Revised: 08/18/2011 Document Reviewed: 01/31/2013 °ExitCare® Patient Information ©2014 [employer]. ° °

## 2013-09-29 ENCOUNTER — Telehealth: Payer: Self-pay | Admitting: *Deleted

## 2013-09-29 NOTE — Telephone Encounter (Addendum)
Pt's mother came to pick up rx and wanted to know whether pt needed to change his dose of the nicotine and he is smoking a pack of cigarettes a day.  Per Dr Vista Mink, he would not change the dose of the nicotine 21mg  daily.  Pt is also not sleeping.  Recommended OTC melatonin and he can discuss at next f/u appt.  [name] verbalized understanding.  SLJ

## 2013-10-03 ENCOUNTER — Encounter: Payer: Self-pay | Admitting: Internal Medicine

## 2013-10-03 ENCOUNTER — Telehealth: Payer: Self-pay | Admitting: Medical Oncology

## 2013-10-03 ENCOUNTER — Other Ambulatory Visit: Payer: Self-pay | Admitting: Medical Oncology

## 2013-10-03 DIAGNOSIS — G4700 Insomnia, unspecified: Secondary | ICD-10-CM

## 2013-10-03 MED ORDER — ZOLPIDEM TARTRATE 10 MG PO TABS: 10.0000 mg | ORAL_TABLET | Freq: Every evening | ORAL | Status: DC | PRN

## 2013-10-03 NOTE — Telephone Encounter (Signed)
ambien escribed per Dr Julien Nordmann.

## 2013-10-03 NOTE — Telephone Encounter (Signed)
Clarise Cruz called to report pt having anxiety attacks, insomnia and pain in ribs when he coughs. Anxiety attacks start with sweating then crying and he can't stop. He feels "like I am  going to die when I lie down". He is taking benadryl and melatonin together for sleep . Just recently added in melatonin. He takes oxycodone for sleep which he says he has been on for a while.

## 2013-10-03 NOTE — Telephone Encounter (Signed)
Per Dr Julien Nordmann pt may have ambien. I called pt and told him to discontinue the benadryl and melatonin and called in [place].

## 2013-10-04 ENCOUNTER — Telehealth: Payer: Self-pay | Admitting: Internal Medicine

## 2013-10-04 ENCOUNTER — Encounter: Payer: Self-pay | Admitting: Physician Assistant

## 2013-10-04 ENCOUNTER — Ambulatory Visit (HOSPITAL_BASED_OUTPATIENT_CLINIC_OR_DEPARTMENT_OTHER): Payer: Medicare Other

## 2013-10-04 ENCOUNTER — Other Ambulatory Visit: Payer: Medicare Other

## 2013-10-04 ENCOUNTER — Ambulatory Visit: Payer: Medicare Other | Admitting: Pulmonary Disease

## 2013-10-04 ENCOUNTER — Ambulatory Visit (HOSPITAL_BASED_OUTPATIENT_CLINIC_OR_DEPARTMENT_OTHER): Payer: Medicare Other | Admitting: Physician Assistant

## 2013-10-04 ENCOUNTER — Ambulatory Visit: Payer: Medicare Other

## 2013-10-04 VITALS — BP 134/65 | HR 120 | Temp 99.2°F | Resp 22 | Ht 69.0 in | Wt 219.9 lb

## 2013-10-04 DIAGNOSIS — C7952 Secondary malignant neoplasm of bone marrow: Secondary | ICD-10-CM

## 2013-10-04 DIAGNOSIS — M7989 Other specified soft tissue disorders: Secondary | ICD-10-CM

## 2013-10-04 DIAGNOSIS — C3490 Malignant neoplasm of unspecified part of unspecified bronchus or lung: Secondary | ICD-10-CM

## 2013-10-04 DIAGNOSIS — C342 Malignant neoplasm of middle lobe, bronchus or lung: Secondary | ICD-10-CM

## 2013-10-04 DIAGNOSIS — C7951 Secondary malignant neoplasm of bone: Secondary | ICD-10-CM

## 2013-10-04 DIAGNOSIS — M549 Dorsalgia, unspecified: Secondary | ICD-10-CM

## 2013-10-04 DIAGNOSIS — Z95828 Presence of other vascular implants and grafts: Secondary | ICD-10-CM

## 2013-10-04 DIAGNOSIS — M25519 Pain in unspecified shoulder: Secondary | ICD-10-CM

## 2013-10-04 DIAGNOSIS — Z5111 Encounter for antineoplastic chemotherapy: Secondary | ICD-10-CM

## 2013-10-04 DIAGNOSIS — G9389 Other specified disorders of brain: Secondary | ICD-10-CM

## 2013-10-04 DIAGNOSIS — C7949 Secondary malignant neoplasm of other parts of nervous system: Secondary | ICD-10-CM

## 2013-10-04 DIAGNOSIS — F411 Generalized anxiety disorder: Secondary | ICD-10-CM

## 2013-10-04 LAB — COMPREHENSIVE METABOLIC PANEL (CC13)
ALK PHOS: 204 U/L — AB (ref 40–150)
ALT: 416 U/L — AB (ref 0–55)
ANION GAP: 10 meq/L (ref 3–11)
AST: 100 U/L — AB (ref 5–34)
Albumin: 3 g/dL — ABNORMAL LOW (ref 3.5–5.0)
BILIRUBIN TOTAL: 0.53 mg/dL (ref 0.20–1.20)
BUN: 10.2 mg/dL (ref 7.0–26.0)
CO2: 29 mEq/L (ref 22–29)
Calcium: 9.1 mg/dL (ref 8.4–10.4)
Chloride: 99 mEq/L (ref 98–109)
Creatinine: 0.6 mg/dL — ABNORMAL LOW (ref 0.7–1.3)
Glucose: 143 mg/dl — ABNORMAL HIGH (ref 70–140)
Potassium: 4.3 mEq/L (ref 3.5–5.1)
SODIUM: 138 meq/L (ref 136–145)
TOTAL PROTEIN: 6.4 g/dL (ref 6.4–8.3)

## 2013-10-04 LAB — CBC WITH DIFFERENTIAL/PLATELET
BASO%: 0.2 % (ref 0.0–2.0)
Basophils Absolute: 0 10*3/uL (ref 0.0–0.1)
EOS%: 1.4 % (ref 0.0–7.0)
Eosinophils Absolute: 0.2 10*3/uL (ref 0.0–0.5)
HCT: 33.9 % — ABNORMAL LOW (ref 38.4–49.9)
HGB: 10.8 g/dL — ABNORMAL LOW (ref 13.0–17.1)
LYMPH%: 5.5 % — ABNORMAL LOW (ref 14.0–49.0)
MCH: 29 pg (ref 27.2–33.4)
MCHC: 31.9 g/dL — AB (ref 32.0–36.0)
MCV: 91 fL (ref 79.3–98.0)
MONO#: 1.2 10*3/uL — ABNORMAL HIGH (ref 0.1–0.9)
MONO%: 7.9 % (ref 0.0–14.0)
NEUT#: 12.7 10*3/uL — ABNORMAL HIGH (ref 1.5–6.5)
NEUT%: 85 % — ABNORMAL HIGH (ref 39.0–75.0)
PLATELETS: 170 10*3/uL (ref 140–400)
RBC: 3.73 10*6/uL — AB (ref 4.20–5.82)
RDW: 22 % — ABNORMAL HIGH (ref 11.0–14.6)
WBC: 15 10*3/uL — AB (ref 4.0–10.3)
lymph#: 0.8 10*3/uL — ABNORMAL LOW (ref 0.9–3.3)

## 2013-10-04 LAB — TECHNOLOGIST REVIEW

## 2013-10-04 MED ORDER — HEPARIN SOD (PORK) LOCK FLUSH 100 UNIT/ML IV SOLN
500.0000 [IU] | Freq: Once | INTRAVENOUS | Status: AC | PRN
  Administered 2013-10-04: 500 [IU]
  Filled 2013-10-04: qty 5

## 2013-10-04 MED ORDER — DEXAMETHASONE SODIUM PHOSPHATE 20 MG/5ML IJ SOLN
20.0000 mg | Freq: Once | INTRAMUSCULAR | Status: AC
  Administered 2013-10-04: 20 mg via INTRAVENOUS

## 2013-10-04 MED ORDER — SODIUM CHLORIDE 0.9 % IV SOLN
750.0000 mg | Freq: Once | INTRAVENOUS | Status: AC
  Administered 2013-10-04: 750 mg via INTRAVENOUS
  Filled 2013-10-04: qty 75

## 2013-10-04 MED ORDER — ONDANSETRON 16 MG/50ML IVPB (CHCC)
INTRAVENOUS | Status: AC
  Filled 2013-10-04: qty 16

## 2013-10-04 MED ORDER — SODIUM CHLORIDE 0.9 % IJ SOLN
10.0000 mL | INTRAMUSCULAR | Status: DC | PRN
  Administered 2013-10-04: 10 mL
  Filled 2013-10-04: qty 10

## 2013-10-04 MED ORDER — OXYCODONE HCL 10 MG PO TABS: ORAL_TABLET | ORAL | Status: DC

## 2013-10-04 MED ORDER — ONDANSETRON 16 MG/50ML IVPB (CHCC)
16.0000 mg | Freq: Once | INTRAVENOUS | Status: AC
  Administered 2013-10-04: 16 mg via INTRAVENOUS

## 2013-10-04 MED ORDER — PEMETREXED DISODIUM CHEMO INJECTION 500 MG
825.0000 mg | Freq: Once | INTRAVENOUS | Status: AC
  Administered 2013-10-04: 825 mg via INTRAVENOUS
  Filled 2013-10-04: qty 33

## 2013-10-04 MED ORDER — DEXAMETHASONE SODIUM PHOSPHATE 20 MG/5ML IJ SOLN
INTRAMUSCULAR | Status: AC
  Filled 2013-10-04: qty 5

## 2013-10-04 MED ORDER — SODIUM CHLORIDE 0.9 % IV SOLN
Freq: Once | INTRAVENOUS | Status: AC
  Administered 2013-10-04: 13:00:00 via INTRAVENOUS

## 2013-10-04 MED ORDER — SODIUM CHLORIDE 0.9 % IJ SOLN
10.0000 mL | INTRAMUSCULAR | Status: DC | PRN
  Administered 2013-10-04: 10 mL via INTRAVENOUS
  Filled 2013-10-04: qty 10

## 2013-10-04 NOTE — Progress Notes (Signed)
OK to treat with today's CMET results per Dr Julien Nordmann

## 2013-10-04 NOTE — Telephone Encounter (Signed)
gv adn printed aptps ched adna vs for pt for May and June...sed added tx.

## 2013-10-04 NOTE — Patient Instructions (Signed)
[facility] Discharge Instructions for Patients Receiving Chemotherapy  Today you received the following chemotherapy agents Alimta/Carboplatin To help prevent nausea and vomiting after your treatment, we encourage you to take your nausea medication as needed   If you develop nausea and vomiting that is not controlled by your nausea medication, call the clinic.   BELOW ARE SYMPTOMS THAT SHOULD BE REPORTED IMMEDIATELY:  *FEVER GREATER THAN 100.5 F  *CHILLS WITH OR WITHOUT FEVER  NAUSEA AND VOMITING THAT IS NOT CONTROLLED WITH YOUR NAUSEA MEDICATION  *UNUSUAL SHORTNESS OF BREATH  *UNUSUAL BRUISING OR BLEEDING  TENDERNESS IN MOUTH AND THROAT WITH OR WITHOUT PRESENCE OF ULCERS  *URINARY PROBLEMS  *BOWEL PROBLEMS  UNUSUAL RASH Items with * indicate a potential emergency and should be followed up as soon as possible.  Feel free to call the clinic you have any questions or concerns. The clinic phone number is [phone].

## 2013-10-04 NOTE — Patient Instructions (Signed)
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
## 2013-10-04 NOTE — Patient Instructions (Signed)
Continue current pain medications as prescribed Followup with your mental health provider as scheduled Continue weekly labs as scheduled Followup in 3 weeks prior to her next scheduled cycle of chemotherapy

## 2013-10-05 ENCOUNTER — Encounter: Payer: Self-pay | Admitting: *Deleted

## 2013-10-11 ENCOUNTER — Ambulatory Visit (HOSPITAL_BASED_OUTPATIENT_CLINIC_OR_DEPARTMENT_OTHER): Payer: Medicare Other

## 2013-10-11 ENCOUNTER — Other Ambulatory Visit: Payer: Self-pay

## 2013-10-11 ENCOUNTER — Telehealth: Payer: Self-pay | Admitting: *Deleted

## 2013-10-11 VITALS — BP 139/59 | HR 100 | Temp 97.5°F

## 2013-10-11 DIAGNOSIS — C7949 Secondary malignant neoplasm of other parts of nervous system: Secondary | ICD-10-CM

## 2013-10-11 DIAGNOSIS — C7931 Secondary malignant neoplasm of brain: Secondary | ICD-10-CM

## 2013-10-11 DIAGNOSIS — Z95828 Presence of other vascular implants and grafts: Secondary | ICD-10-CM

## 2013-10-11 DIAGNOSIS — Z452 Encounter for adjustment and management of vascular access device: Secondary | ICD-10-CM

## 2013-10-11 DIAGNOSIS — C3490 Malignant neoplasm of unspecified part of unspecified bronchus or lung: Secondary | ICD-10-CM

## 2013-10-11 LAB — COMPREHENSIVE METABOLIC PANEL (CC13)
ALBUMIN: 2.8 g/dL — AB (ref 3.5–5.0)
ALT: 211 U/L — ABNORMAL HIGH (ref 0–55)
AST: 30 U/L (ref 5–34)
Alkaline Phosphatase: 185 U/L — ABNORMAL HIGH (ref 40–150)
Anion Gap: 10 mEq/L (ref 3–11)
BILIRUBIN TOTAL: 0.41 mg/dL (ref 0.20–1.20)
BUN: 7.6 mg/dL (ref 7.0–26.0)
CO2: 28 mEq/L (ref 22–29)
Calcium: 8.6 mg/dL (ref 8.4–10.4)
Chloride: 103 mEq/L (ref 98–109)
Creatinine: 0.7 mg/dL (ref 0.7–1.3)
GLUCOSE: 186 mg/dL — AB (ref 70–140)
POTASSIUM: 3.6 meq/L (ref 3.5–5.1)
SODIUM: 141 meq/L (ref 136–145)
TOTAL PROTEIN: 6.2 g/dL — AB (ref 6.4–8.3)

## 2013-10-11 LAB — CBC WITH DIFFERENTIAL/PLATELET
BASO%: 0.6 % (ref 0.0–2.0)
Basophils Absolute: 0 10*3/uL (ref 0.0–0.1)
EOS%: 8.2 % — AB (ref 0.0–7.0)
Eosinophils Absolute: 0.4 10*3/uL (ref 0.0–0.5)
HCT: 27.3 % — ABNORMAL LOW (ref 38.4–49.9)
HGB: 8.8 g/dL — ABNORMAL LOW (ref 13.0–17.1)
LYMPH%: 9.7 % — AB (ref 14.0–49.0)
MCH: 29.1 pg (ref 27.2–33.4)
MCHC: 32.4 g/dL (ref 32.0–36.0)
MCV: 89.6 fL (ref 79.3–98.0)
MONO#: 0.2 10*3/uL (ref 0.1–0.9)
MONO%: 4.1 % (ref 0.0–14.0)
NEUT#: 4.1 10*3/uL (ref 1.5–6.5)
NEUT%: 77.4 % — ABNORMAL HIGH (ref 39.0–75.0)
PLATELETS: 71 10*3/uL — AB (ref 140–400)
RBC: 3.04 10*6/uL — AB (ref 4.20–5.82)
RDW: 22.1 % — AB (ref 11.0–14.6)
WBC: 5.3 10*3/uL (ref 4.0–10.3)
lymph#: 0.5 10*3/uL — ABNORMAL LOW (ref 0.9–3.3)

## 2013-10-11 MED ORDER — SODIUM CHLORIDE 0.9 % IJ SOLN
10.0000 mL | INTRAMUSCULAR | Status: DC | PRN
  Administered 2013-10-11: 10 mL via INTRAVENOUS
  Filled 2013-10-11: qty 10

## 2013-10-11 MED ORDER — HEPARIN SOD (PORK) LOCK FLUSH 100 UNIT/ML IV SOLN
500.0000 [IU] | Freq: Once | INTRAVENOUS | Status: AC
  Administered 2013-10-11: 500 [IU] via INTRAVENOUS
  Filled 2013-10-11: qty 5

## 2013-10-11 MED ORDER — FUROSEMIDE 20 MG PO TABS: ORAL_TABLET | ORAL | Status: DC

## 2013-10-11 NOTE — Patient Instructions (Signed)

## 2013-10-11 NOTE — Telephone Encounter (Signed)
Pt's mother called stating that pt is having swelling in his feet with a rash.  He also has swelling in his face and the rash is also on his abdomen.  Per Dr Vista Mink, he is not sure why the swelling is occuring, he can either go to his PCP today or when he comes in for lab work he can fill out a walk-in form.  Advised that with a walk in form it can take up to 2 hours of a wait time due being a walk in.  She states she feels he needs to be seen today and will call his PCP.  Asked for her to keep [place] informed on pt status.  SLJ

## 2013-10-11 NOTE — Telephone Encounter (Signed)
PT. HAS BILATERAL SWELLING FROM TOES TO JUST ABOVE HIS ANKLES. THE RIGHT FOOT IS GREATER THAN THE LEFT FOOT. THE FEET PAIN IS AT A SCALE OF SEVEN. HE ALSO HAS A RASH ON HIS FEET AND ABDOMEN. THE RASH IS RAISED RED BUMPS AND ITCHES. THERE IS A BRUISED AREA ON RIGHT FIFTH TOE. HIS FACE IS SLIGHTLY PUFFY. HE IS TAPERING OFF THE DEXAMETHASONE. PT. HAS HAD A COUGH WITH SOME HOARSENESS FOR A FEW DAYS. HE IS ALSO WHEEZING. PT. OCCASIONALLY COUGHS UP A SMALL AMOUNT OF "DARK BLACK" SPUTUM. NO FEVER THE PAST 24 HOURS. PT'S LOWER BACK PAIN IS AT A SCALE OF EIGHT. HE IS USING HIS PAIN MEDICATIONS AS DIRECTED BY ADRENA JOHNSON,PA AT 10/04/13 VISIT. NOTIFIED DR.MOHAMED. VERBAL ORDER AND READ BACK TO DR.MOHAMED- IF RASH AND SWELLING PERSIST TO CONTACT HIS PRIMARY CARE PHYSICIAN, DR.HOPPER. SEE MEDICATION LIST FOR NEW ORDER. NOTIFIED PT. AND HIS MOTHER OF DR.MOHAMED'S INSTRUCTIONS. ALSO INSTRUCTED PT. IF HIS CONDITION WORSENS TO GO TO THE EMERGENCY ROOM TO BE EVALUATED. PT. AND HIS MOTHER VOICE UNDERSTANDING.

## 2013-10-12 ENCOUNTER — Encounter: Payer: Self-pay | Admitting: Internal Medicine

## 2013-10-12 ENCOUNTER — Ambulatory Visit (INDEPENDENT_AMBULATORY_CARE_PROVIDER_SITE_OTHER): Payer: Medicare Other | Admitting: Internal Medicine

## 2013-10-12 VITALS — BP 120/68 | HR 116 | Temp 98.0°F | Resp 15 | Wt 224.2 lb

## 2013-10-12 DIAGNOSIS — L259 Unspecified contact dermatitis, unspecified cause: Secondary | ICD-10-CM

## 2013-10-12 DIAGNOSIS — R059 Cough, unspecified: Secondary | ICD-10-CM

## 2013-10-12 DIAGNOSIS — R05 Cough: Secondary | ICD-10-CM

## 2013-10-12 DIAGNOSIS — L309 Dermatitis, unspecified: Secondary | ICD-10-CM

## 2013-10-12 MED ORDER — MOMETASONE FUROATE 0.1 % EX OINT: TOPICAL_OINTMENT | Freq: Two times a day (BID) | CUTANEOUS | Status: DC

## 2013-10-12 MED ORDER — BENZONATATE 200 MG PO CAPS: 200.0000 mg | ORAL_CAPSULE | Freq: Three times a day (TID) | ORAL | Status: DC | PRN

## 2013-10-12 NOTE — Progress Notes (Signed)
   Subjective:    Patient ID: [name] [name], male    DOB: 1965-11-30, 48 y.o.   MRN: [id number]  HPI  He has had a rash over his feet and lower abdomen for 4 days. It is tender and slightly painful.It is  is also pruritic. OTC Cortaid was applied without significant response. He denies using lidocaine or topical antibiotic creams.  He also has a cough at night when he lies supine. Delsym and Robitussin with only minimal benefit    Review of Systems   He describes significant diaphoresis without chills or fever  He also denies itchy, watery eyes, sneezing  His head no angioedema symptoms of the lips or tongue  He's had some wheezing.     Objective:   Physical Exam  Nares are patent; there is no posterior or pharyngeal airway compromise  He has no lymphadenopathy about the neck or axilla  Chest is surprisingly clear with only minimal basilar rhonchi.  Exhibits tachycardia with slight flow murmur  Abdomen is protuberant.  There is a dry eczematoid rash over the lower abdomen as well as over the dorsum of the feet. This is most striking on the right foot. Initially it appeared petechial but it does not blanch and is dry. There is evidence of some excoriation.  He has 2+ foot edema.        Assessment & Plan:  #1 eczematoid rash dorsum of the lower abdomen  #2 cough, worse supine ; cardiopulmonary vs reflux  See orders

## 2013-10-12 NOTE — Progress Notes (Signed)
Pre visit review using our clinic review tool, if applicable. No additional management support is needed unless otherwise documented below in the visit note. 

## 2013-10-12 NOTE — Patient Instructions (Signed)
Avoid soaps  which are not hypoallergenic. Use liquid soap when bathing.Restrict hyperallergenic foods at this time: Nuts, strawberries, seafood , chocolate, and tomatoes.

## 2013-10-17 ENCOUNTER — Other Ambulatory Visit: Payer: Self-pay | Admitting: Physician Assistant

## 2013-10-17 ENCOUNTER — Encounter: Payer: Self-pay | Admitting: Internal Medicine

## 2013-10-17 ENCOUNTER — Telehealth: Payer: Self-pay | Admitting: *Deleted

## 2013-10-17 ENCOUNTER — Other Ambulatory Visit: Payer: Self-pay | Admitting: *Deleted

## 2013-10-17 DIAGNOSIS — C7949 Secondary malignant neoplasm of other parts of nervous system: Secondary | ICD-10-CM

## 2013-10-17 DIAGNOSIS — C342 Malignant neoplasm of middle lobe, bronchus or lung: Secondary | ICD-10-CM

## 2013-10-17 DIAGNOSIS — C7951 Secondary malignant neoplasm of bone: Secondary | ICD-10-CM

## 2013-10-17 MED ORDER — OXYCODONE HCL 10 MG PO TABS: ORAL_TABLET | ORAL | Status: DC

## 2013-10-17 MED ORDER — FENTANYL 50 MCG/HR TD PT72: 50.0000 ug | MEDICATED_PATCH | TRANSDERMAL | Status: DC

## 2013-10-17 MED ORDER — GABAPENTIN 100 MG PO CAPS: 200.0000 mg | ORAL_CAPSULE | Freq: Three times a day (TID) | ORAL | Status: DC

## 2013-10-17 NOTE — Telephone Encounter (Signed)
Spoke with mother Judson Roch and informed her that prescriptions are ready for pt to pick up when pt comes in for lab on 10/18/13.  Mother voiced understanding.

## 2013-10-18 ENCOUNTER — Ambulatory Visit (HOSPITAL_BASED_OUTPATIENT_CLINIC_OR_DEPARTMENT_OTHER): Payer: Medicare Other | Admitting: Internal Medicine

## 2013-10-18 ENCOUNTER — Telehealth: Payer: Self-pay | Admitting: *Deleted

## 2013-10-18 VITALS — BP 97/59 | HR 135 | Temp 96.9°F | Resp 17

## 2013-10-18 DIAGNOSIS — Z452 Encounter for adjustment and management of vascular access device: Secondary | ICD-10-CM

## 2013-10-18 DIAGNOSIS — C7951 Secondary malignant neoplasm of bone: Secondary | ICD-10-CM

## 2013-10-18 DIAGNOSIS — C342 Malignant neoplasm of middle lobe, bronchus or lung: Secondary | ICD-10-CM

## 2013-10-18 DIAGNOSIS — C7952 Secondary malignant neoplasm of bone marrow: Secondary | ICD-10-CM

## 2013-10-18 DIAGNOSIS — C3490 Malignant neoplasm of unspecified part of unspecified bronchus or lung: Secondary | ICD-10-CM

## 2013-10-18 DIAGNOSIS — Z95828 Presence of other vascular implants and grafts: Secondary | ICD-10-CM

## 2013-10-18 LAB — CBC WITH DIFFERENTIAL/PLATELET
BASO%: 0.4 % (ref 0.0–2.0)
Basophils Absolute: 0 10*3/uL (ref 0.0–0.1)
EOS%: 16.9 % — AB (ref 0.0–7.0)
Eosinophils Absolute: 0.9 10*3/uL — ABNORMAL HIGH (ref 0.0–0.5)
HEMATOCRIT: 22.5 % — AB (ref 38.4–49.9)
HGB: 7.2 g/dL — ABNORMAL LOW (ref 13.0–17.1)
LYMPH%: 11.7 % — ABNORMAL LOW (ref 14.0–49.0)
MCH: 28.5 pg (ref 27.2–33.4)
MCHC: 32 g/dL (ref 32.0–36.0)
MCV: 88.9 fL (ref 79.3–98.0)
MONO#: 0.8 10*3/uL (ref 0.1–0.9)
MONO%: 15.3 % — ABNORMAL HIGH (ref 0.0–14.0)
NEUT#: 3 10*3/uL (ref 1.5–6.5)
NEUT%: 55.7 % (ref 39.0–75.0)
Platelets: 166 10*3/uL (ref 140–400)
RBC: 2.53 10*6/uL — ABNORMAL LOW (ref 4.20–5.82)
RDW: 22.6 % — ABNORMAL HIGH (ref 11.0–14.6)
WBC: 5.3 10*3/uL (ref 4.0–10.3)
lymph#: 0.6 10*3/uL — ABNORMAL LOW (ref 0.9–3.3)
nRBC: 3 % — ABNORMAL HIGH (ref 0–0)

## 2013-10-18 LAB — COMPREHENSIVE METABOLIC PANEL (CC13)
ALK PHOS: 176 U/L — AB (ref 40–150)
ALT: 61 U/L — ABNORMAL HIGH (ref 0–55)
AST: 22 U/L (ref 5–34)
Albumin: 2.3 g/dL — ABNORMAL LOW (ref 3.5–5.0)
Anion Gap: 10 mEq/L (ref 3–11)
BUN: 4 mg/dL — AB (ref 7.0–26.0)
CO2: 27 mEq/L (ref 22–29)
CREATININE: 0.6 mg/dL — AB (ref 0.7–1.3)
Calcium: 8.5 mg/dL (ref 8.4–10.4)
Chloride: 96 mEq/L — ABNORMAL LOW (ref 98–109)
Glucose: 118 mg/dl (ref 70–140)
POTASSIUM: 3.8 meq/L (ref 3.5–5.1)
Sodium: 134 mEq/L — ABNORMAL LOW (ref 136–145)
Total Bilirubin: 0.46 mg/dL (ref 0.20–1.20)
Total Protein: 6.3 g/dL — ABNORMAL LOW (ref 6.4–8.3)

## 2013-10-18 LAB — TECHNOLOGIST REVIEW

## 2013-10-18 MED ORDER — SODIUM CHLORIDE 0.9 % IJ SOLN
10.0000 mL | INTRAMUSCULAR | Status: DC | PRN
Start: 2013-10-18 — End: 2013-11-01
  Administered 2013-10-18: 10 mL via INTRAVENOUS
  Filled 2013-10-18: qty 10

## 2013-10-18 MED ORDER — HEPARIN SOD (PORK) LOCK FLUSH 100 UNIT/ML IV SOLN
500.0000 [IU] | Freq: Once | INTRAVENOUS | Status: AC
  Administered 2013-10-18: 500 [IU] via INTRAVENOUS
  Filled 2013-10-18: qty 5

## 2013-10-18 NOTE — Patient Instructions (Signed)

## 2013-10-18 NOTE — Telephone Encounter (Signed)
Pt called and left a messege that "when I take my fluid pill I have trouble going to the bathroom".  Attempted to call pt back, no answer.  Left messege to call us back to assess further.  SLJ

## 2013-10-18 NOTE — Progress Notes (Signed)
Quick Note:  Call patient with the result and Arrange for him 2 units of PBCs transfusion. ______

## 2013-10-19 ENCOUNTER — Other Ambulatory Visit (HOSPITAL_BASED_OUTPATIENT_CLINIC_OR_DEPARTMENT_OTHER): Payer: Medicare Other

## 2013-10-19 ENCOUNTER — Ambulatory Visit (HOSPITAL_BASED_OUTPATIENT_CLINIC_OR_DEPARTMENT_OTHER): Payer: Medicare Other

## 2013-10-19 ENCOUNTER — Telehealth: Payer: Self-pay | Admitting: Medical Oncology

## 2013-10-19 ENCOUNTER — Other Ambulatory Visit: Payer: Self-pay | Admitting: Medical Oncology

## 2013-10-19 ENCOUNTER — Ambulatory Visit (HOSPITAL_COMMUNITY)
Admission: RE | Admit: 2013-10-19 | Discharge: 2013-10-19 | Disposition: A | Discharge status: Home / Self Care | Payer: Medicare Other | Source: Ambulatory Visit | Attending: Internal Medicine | Admitting: Internal Medicine

## 2013-10-19 VITALS — BP 109/64 | HR 120 | Temp 99.0°F

## 2013-10-19 DIAGNOSIS — R350 Frequency of micturition: Secondary | ICD-10-CM

## 2013-10-19 DIAGNOSIS — Z452 Encounter for adjustment and management of vascular access device: Secondary | ICD-10-CM

## 2013-10-19 DIAGNOSIS — D649 Anemia, unspecified: Secondary | ICD-10-CM

## 2013-10-19 DIAGNOSIS — C342 Malignant neoplasm of middle lobe, bronchus or lung: Secondary | ICD-10-CM

## 2013-10-19 DIAGNOSIS — Z95828 Presence of other vascular implants and grafts: Secondary | ICD-10-CM

## 2013-10-19 LAB — URINALYSIS, MICROSCOPIC - CHCC
Bilirubin (Urine): NEGATIVE
Blood: NEGATIVE
Glucose: NEGATIVE mg/dL
KETONES: NEGATIVE mg/dL
Leukocyte Esterase: NEGATIVE
Nitrite: NEGATIVE
PH: 7 (ref 4.6–8.0)
Protein: NEGATIVE mg/dL
RBC / HPF: NEGATIVE (ref 0–2)
SPECIFIC GRAVITY, URINE: 1.005 (ref 1.003–1.035)
Urobilinogen, UR: 0.2 mg/dL (ref 0.2–1)

## 2013-10-19 LAB — HOLD TUBE, BLOOD BANK

## 2013-10-19 LAB — ABO/RH: ABO/RH(D): O POS

## 2013-10-19 MED ORDER — SODIUM CHLORIDE 0.9 % IJ SOLN
10.0000 mL | INTRAMUSCULAR | Status: DC | PRN
  Administered 2013-10-19: 10 mL via INTRAVENOUS
  Filled 2013-10-19: qty 10

## 2013-10-19 MED ORDER — HEPARIN SOD (PORK) LOCK FLUSH 100 UNIT/ML IV SOLN
500.0000 [IU] | Freq: Once | INTRAVENOUS | Status: AC
  Administered 2013-10-19: 500 [IU] via INTRAVENOUS
  Filled 2013-10-19: qty 5

## 2013-10-19 NOTE — Telephone Encounter (Signed)
Left voice message about blood transfusion appt.

## 2013-10-19 NOTE — Progress Notes (Signed)
Per Dr Julien Nordmann pt scheduled for labs, blood transfusion and urinalysis.  Pt with temp 100.7, anemia and  frequent urination. HAR done. Pts mother notified of appts.

## 2013-10-19 NOTE — Patient Instructions (Signed)

## 2013-10-20 ENCOUNTER — Encounter: Payer: Self-pay | Admitting: Internal Medicine

## 2013-10-20 ENCOUNTER — Ambulatory Visit (INDEPENDENT_AMBULATORY_CARE_PROVIDER_SITE_OTHER): Payer: Medicare Other | Admitting: Internal Medicine

## 2013-10-20 VITALS — BP 118/62 | HR 121 | Temp 98.0°F | Wt 228.0 lb

## 2013-10-20 DIAGNOSIS — R609 Edema, unspecified: Secondary | ICD-10-CM

## 2013-10-20 DIAGNOSIS — R34 Anuria and oliguria: Secondary | ICD-10-CM

## 2013-10-20 DIAGNOSIS — I10 Essential (primary) hypertension: Secondary | ICD-10-CM

## 2013-10-20 DIAGNOSIS — R000 Tachycardia, unspecified: Secondary | ICD-10-CM

## 2013-10-20 DIAGNOSIS — K5909 Other constipation: Secondary | ICD-10-CM

## 2013-10-20 DIAGNOSIS — K5903 Drug induced constipation: Secondary | ICD-10-CM

## 2013-10-20 MED ORDER — TORSEMIDE 10 MG PO TABS: 10.0000 mg | ORAL_TABLET | Freq: Every day | ORAL | Status: DC

## 2013-10-20 MED ORDER — LINACLOTIDE 145 MCG PO CAPS: 145.0000 ug | ORAL_CAPSULE | Freq: Every day | ORAL | Status: DC

## 2013-10-20 NOTE — Progress Notes (Signed)
Pre visit review using our clinic review tool, if applicable. No additional management support is needed unless otherwise documented below in the visit note. 

## 2013-10-20 NOTE — Progress Notes (Signed)
   Subjective:    Patient ID: [name] [name], male    DOB: 22-Sep-1965, 48 y.o.   MRN: [id number]  HPI  He has not taken the low-dose Lasix for least 3 days but still believes that his constipation and decreased urination are related to this. The concept of half-life of the agents was discussed.  The symptoms as expected side effects of narcotic pain medication was not received with acceptance.    The 10/18/13 labs from the [facility] were reviewed. His BUN is 4 and creatinine 0.6. Total protein 6.3 and albumin 2.3. He had been taking a protein supplement but stopped this.  His hepatic function has improved; on April 28 his ALT was 416 and AST 100. On 10/18/13 AST was 22 and ALT 61.  He did have a bowel movement today which was small and was not melanous. There is no associated rectal bleeding  His hemoglobin was 7.2 hematocrit 22.5 on 5/ 12. He is scheduled for transfusions tomorrow 5/15.  A copy the labs was provided and discussed in detail.    Review of Systems  The rash over the feet has improved with topical steroids.  His mother says that he does have wheezing at night but no definite paroxysmal nocturnal dyspnea.  He denies chest pain, palpitations, or claudication symptoms     Objective:   Physical Exam  Cushingoid appearance is suggested on exam  Skin pallor is present.  Has no lymphadenopathy about the head, neck, axilla  Thyroid is normal to palpation  Chest is surprisingly clear except for mild rales in the left upper lobe.  He exhibits resting tachycardia with slight flow murmur. Recheck of pulse was 1:15.  Abdomen is massively distended. Bowel sounds are present but decreased. Clinically there is no evidence of ileus.  His 2+ pitting edema of the lower extremities.  Pedal pulses are decreased.  There is a regular erythematous rash over the dorsum of the right foot. There is minimal findings on the left as well. Compared to his last visit; these have  improved significantly.        Assessment & Plan:  #1 edema most likely related to low protein stores as there is no renal insufficiency and his hepatic dysfunction is improving  #2 bowel and bladder dysfunction related to the narcotics. He is not accepting of this  #3 tachycardia; recheck of pulse 115.  Plan: Protein supplementation will be stressed  To be changed to [facility] for the fluid retention.  Trial of Linzess for the narcotic induced constipation.

## 2013-10-20 NOTE — Patient Instructions (Addendum)
Total protein & albumin reflect nutrition; liquid protein supplements  or plant protein (Ex soy) sources from [facility]  are recommended as supplements.Dose should not exceed per package label. Low albumin can also be  associated with significant swelling in the legs. Linzess daily for constipation.

## 2013-10-21 ENCOUNTER — Ambulatory Visit (HOSPITAL_BASED_OUTPATIENT_CLINIC_OR_DEPARTMENT_OTHER): Payer: Medicare Other

## 2013-10-21 ENCOUNTER — Telehealth: Payer: Self-pay | Admitting: Internal Medicine

## 2013-10-21 VITALS — BP 111/60 | HR 102 | Temp 98.2°F | Resp 16

## 2013-10-21 DIAGNOSIS — D649 Anemia, unspecified: Secondary | ICD-10-CM

## 2013-10-21 DIAGNOSIS — M7989 Other specified soft tissue disorders: Secondary | ICD-10-CM

## 2013-10-21 LAB — PREPARE RBC (CROSSMATCH)

## 2013-10-21 MED ORDER — SODIUM CHLORIDE 0.9 % IJ SOLN
10.0000 mL | INTRAMUSCULAR | Status: DC | PRN
  Administered 2013-10-21: 10 mL via INTRAVENOUS
  Filled 2013-10-21: qty 10

## 2013-10-21 MED ORDER — DIPHENHYDRAMINE HCL 25 MG PO CAPS
ORAL_CAPSULE | ORAL | Status: AC
  Filled 2013-10-21: qty 1

## 2013-10-21 MED ORDER — HEPARIN SOD (PORK) LOCK FLUSH 100 UNIT/ML IV SOLN
500.0000 [IU] | Freq: Once | INTRAVENOUS | Status: AC
  Administered 2013-10-21: 500 [IU] via INTRAVENOUS
  Filled 2013-10-21: qty 5

## 2013-10-21 MED ORDER — ACETAMINOPHEN 325 MG PO TABS
ORAL_TABLET | ORAL | Status: AC
  Filled 2013-10-21: qty 2

## 2013-10-21 MED ORDER — ACETAMINOPHEN 325 MG PO TABS
650.0000 mg | ORAL_TABLET | Freq: Once | ORAL | Status: AC
  Administered 2013-10-21: 650 mg via ORAL

## 2013-10-21 MED ORDER — DIPHENHYDRAMINE HCL 25 MG PO CAPS
25.0000 mg | ORAL_CAPSULE | Freq: Once | ORAL | Status: AC
  Administered 2013-10-21: 25 mg via ORAL

## 2013-10-21 MED ORDER — SODIUM CHLORIDE 0.9 % IV SOLN
250.0000 mL | Freq: Once | INTRAVENOUS | Status: AC
  Administered 2013-10-21: 250 mL via INTRAVENOUS

## 2013-10-21 MED ORDER — FUROSEMIDE 10 MG/ML IJ SOLN
20.0000 mg | Freq: Once | INTRAMUSCULAR | Status: AC
  Administered 2013-10-21: 20 mg via INTRAVENOUS

## 2013-10-21 NOTE — Telephone Encounter (Signed)
Relevant patient education assigned to patient using Emmi. ° °

## 2013-10-21 NOTE — Progress Notes (Signed)
Pt is c/o increase LE swelling during his blood transfusion.  Spoke with Dr. Juliann Mule, okay to give 20mg  IV lasix at the end of his transfusion.  SLJ

## 2013-10-21 NOTE — Patient Instructions (Signed)
Blood Transfusion Information WHAT IS A BLOOD TRANSFUSION? A transfusion is the replacement of blood or some of its parts. Blood is made up of multiple cells which provide different functions.  Red blood cells carry oxygen and are used for blood loss replacement.  White blood cells fight against infection.  Platelets control bleeding.  Plasma helps clot blood.  Other blood products are available for specialized needs, such as hemophilia or other clotting disorders. BEFORE THE TRANSFUSION  Who gives blood for transfusions?   You may be able to donate blood to be used at a later date on yourself (autologous donation).  Relatives can be asked to donate blood. This is generally not any safer than if you have received blood from a stranger. The same precautions are taken to ensure safety when a relative's blood is donated.  Healthy volunteers who are fully evaluated to make sure their blood is safe. This is blood bank blood. Transfusion therapy is the safest it has ever been in the practice of medicine. Before blood is taken from a donor, a complete history is taken to make sure that person has no history of diseases nor engages in risky social behavior (examples are intravenous drug use or sexual activity with multiple partners). The donor's travel history is screened to minimize risk of transmitting infections, such as malaria. The donated blood is tested for signs of infectious diseases, such as HIV and hepatitis. The blood is then tested to be sure it is compatible with you in order to minimize the chance of a transfusion reaction. If you or a relative donates blood, this is often done in anticipation of surgery and is not appropriate for emergency situations. It takes many days to process the donated blood. RISKS AND COMPLICATIONS Although transfusion therapy is very safe and saves many lives, the main dangers of transfusion include:   Getting an infectious disease.  Developing a  transfusion reaction. This is an allergic reaction to something in the blood you were given. Every precaution is taken to prevent this. The decision to have a blood transfusion has been considered carefully by your caregiver before blood is given. Blood is not given unless the benefits outweigh the risks. AFTER THE TRANSFUSION  Right after receiving a blood transfusion, you will usually feel much better and more energetic. This is especially true if your red blood cells have gotten low (anemic). The transfusion raises the level of the red blood cells which carry oxygen, and this usually causes an energy increase.  The nurse administering the transfusion will monitor you carefully for complications. HOME CARE INSTRUCTIONS  No special instructions are needed after a transfusion. You may find your energy is better. Speak with your caregiver about any limitations on activity for underlying diseases you may have. SEEK MEDICAL CARE IF:   Your condition is not improving after your transfusion.  You develop redness or irritation at the intravenous (IV) site. SEEK IMMEDIATE MEDICAL CARE IF:  Any of the following symptoms occur over the next 12 hours:  Shaking chills.  You have a temperature by mouth above 102 F (38.9 C), not controlled by medicine.  Chest, back, or muscle pain.  People around you feel you are not acting correctly or are confused.  Shortness of breath or difficulty breathing.  Dizziness and fainting.  You get a rash or develop hives.  You have a decrease in urine output.  Your urine turns a dark color or changes to pink, red, or brown. Any of the following   symptoms occur over the next 10 days:  You have a temperature by mouth above 102 F (38.9 C), not controlled by medicine.  Shortness of breath.  Weakness after normal activity.  The white part of the eye turns yellow (jaundice).  You have a decrease in the amount of urine or are urinating less often.  Your  urine turns a dark color or changes to pink, red, or brown. Document Released: 05/23/2000 Document Revised: 08/18/2011 Document Reviewed: 01/10/2008 ExitCare Patient Information 2014 [employer].  

## 2013-10-23 ENCOUNTER — Other Ambulatory Visit: Payer: Self-pay | Admitting: Internal Medicine

## 2013-10-24 LAB — TYPE AND SCREEN
ABO/RH(D): O POS
Antibody Screen: NEGATIVE
UNIT DIVISION: 0
Unit division: 0

## 2013-10-24 NOTE — Telephone Encounter (Signed)
OK #[id number]

## 2013-10-25 ENCOUNTER — Other Ambulatory Visit: Payer: Self-pay

## 2013-10-25 ENCOUNTER — Ambulatory Visit (HOSPITAL_BASED_OUTPATIENT_CLINIC_OR_DEPARTMENT_OTHER): Payer: Medicare Other | Admitting: Physician Assistant

## 2013-10-25 ENCOUNTER — Encounter: Payer: Self-pay | Admitting: Physician Assistant

## 2013-10-25 ENCOUNTER — Telehealth: Payer: Self-pay | Admitting: Internal Medicine

## 2013-10-25 ENCOUNTER — Ambulatory Visit: Payer: Medicare Other

## 2013-10-25 ENCOUNTER — Other Ambulatory Visit (HOSPITAL_BASED_OUTPATIENT_CLINIC_OR_DEPARTMENT_OTHER): Payer: Medicare Other

## 2013-10-25 ENCOUNTER — Ambulatory Visit (HOSPITAL_BASED_OUTPATIENT_CLINIC_OR_DEPARTMENT_OTHER): Payer: Medicare Other

## 2013-10-25 VITALS — BP 105/67 | HR 117 | Resp 20

## 2013-10-25 VITALS — BP 117/71 | HR 133 | Temp 98.1°F | Resp 19 | Ht 69.0 in | Wt 226.2 lb

## 2013-10-25 DIAGNOSIS — C3490 Malignant neoplasm of unspecified part of unspecified bronchus or lung: Secondary | ICD-10-CM

## 2013-10-25 DIAGNOSIS — C342 Malignant neoplasm of middle lobe, bronchus or lung: Secondary | ICD-10-CM

## 2013-10-25 DIAGNOSIS — C7951 Secondary malignant neoplasm of bone: Secondary | ICD-10-CM

## 2013-10-25 DIAGNOSIS — Z5111 Encounter for antineoplastic chemotherapy: Secondary | ICD-10-CM

## 2013-10-25 DIAGNOSIS — C7952 Secondary malignant neoplasm of bone marrow: Secondary | ICD-10-CM

## 2013-10-25 DIAGNOSIS — M7989 Other specified soft tissue disorders: Secondary | ICD-10-CM

## 2013-10-25 DIAGNOSIS — C7949 Secondary malignant neoplasm of other parts of nervous system: Secondary | ICD-10-CM

## 2013-10-25 LAB — COMPREHENSIVE METABOLIC PANEL (CC13)
ALK PHOS: 151 U/L — AB (ref 40–150)
ALT: 15 U/L (ref 0–55)
AST: 12 U/L (ref 5–34)
Albumin: 2.3 g/dL — ABNORMAL LOW (ref 3.5–5.0)
Anion Gap: 13 mEq/L — ABNORMAL HIGH (ref 3–11)
BILIRUBIN TOTAL: 0.48 mg/dL (ref 0.20–1.20)
BUN: 6 mg/dL — ABNORMAL LOW (ref 7.0–26.0)
CO2: 31 mEq/L — ABNORMAL HIGH (ref 22–29)
CREATININE: 0.7 mg/dL (ref 0.7–1.3)
Calcium: 7.7 mg/dL — ABNORMAL LOW (ref 8.4–10.4)
Chloride: 91 mEq/L — ABNORMAL LOW (ref 98–109)
Glucose: 108 mg/dl (ref 70–140)
Potassium: 3.4 mEq/L — ABNORMAL LOW (ref 3.5–5.1)
Sodium: 134 mEq/L — ABNORMAL LOW (ref 136–145)
Total Protein: 6.7 g/dL (ref 6.4–8.3)

## 2013-10-25 LAB — CBC WITH DIFFERENTIAL/PLATELET
BASO%: 1.1 % (ref 0.0–2.0)
Basophils Absolute: 0.2 10*3/uL — ABNORMAL HIGH (ref 0.0–0.1)
EOS%: 5 % (ref 0.0–7.0)
Eosinophils Absolute: 0.9 10*3/uL — ABNORMAL HIGH (ref 0.0–0.5)
HCT: 28.8 % — ABNORMAL LOW (ref 38.4–49.9)
HEMOGLOBIN: 9.5 g/dL — AB (ref 13.0–17.1)
LYMPH#: 1 10*3/uL (ref 0.9–3.3)
LYMPH%: 5.6 % — AB (ref 14.0–49.0)
MCH: 28.9 pg (ref 27.2–33.4)
MCHC: 32.9 g/dL (ref 32.0–36.0)
MCV: 87.8 fL (ref 79.3–98.0)
MONO#: 1.2 10*3/uL — ABNORMAL HIGH (ref 0.1–0.9)
MONO%: 6.7 % (ref 0.0–14.0)
NEUT%: 81.6 % — ABNORMAL HIGH (ref 39.0–75.0)
NEUTROS ABS: 14.9 10*3/uL — AB (ref 1.5–6.5)
PLATELETS: 245 10*3/uL (ref 140–400)
RBC: 3.28 10*6/uL — ABNORMAL LOW (ref 4.20–5.82)
RDW: 20.7 % — ABNORMAL HIGH (ref 11.0–14.6)
WBC: 18.2 10*3/uL — ABNORMAL HIGH (ref 4.0–10.3)

## 2013-10-25 LAB — TECHNOLOGIST REVIEW

## 2013-10-25 MED ORDER — SODIUM CHLORIDE 0.9 % IV SOLN
500.0000 mg/m2 | Freq: Once | INTRAVENOUS | Status: AC
  Administered 2013-10-25: 1050 mg via INTRAVENOUS
  Filled 2013-10-25: qty 42

## 2013-10-25 MED ORDER — SODIUM CHLORIDE 0.9 % IV SOLN
Freq: Once | INTRAVENOUS | Status: AC
  Administered 2013-10-25: 13:00:00 via INTRAVENOUS

## 2013-10-25 MED ORDER — CYANOCOBALAMIN 1000 MCG/ML IJ SOLN
1000.0000 ug | Freq: Once | INTRAMUSCULAR | Status: AC
Start: 2013-10-25 — End: 2013-10-25
  Administered 2013-10-25: 1000 ug via INTRAMUSCULAR

## 2013-10-25 MED ORDER — HEPARIN SOD (PORK) LOCK FLUSH 100 UNIT/ML IV SOLN
500.0000 [IU] | Freq: Once | INTRAVENOUS | Status: AC | PRN
  Administered 2013-10-25: 500 [IU]
  Filled 2013-10-25: qty 5

## 2013-10-25 MED ORDER — SODIUM CHLORIDE 0.9 % IJ SOLN
10.0000 mL | INTRAMUSCULAR | Status: DC | PRN
  Administered 2013-10-25: 10 mL
  Filled 2013-10-25: qty 10

## 2013-10-25 MED ORDER — CYANOCOBALAMIN 1000 MCG/ML IJ SOLN
INTRAMUSCULAR | Status: AC
  Filled 2013-10-25: qty 1

## 2013-10-25 MED ORDER — ONDANSETRON 16 MG/50ML IVPB (CHCC)
16.0000 mg | Freq: Once | INTRAVENOUS | Status: AC
  Administered 2013-10-25: 16 mg via INTRAVENOUS

## 2013-10-25 MED ORDER — DEXAMETHASONE SODIUM PHOSPHATE 20 MG/5ML IJ SOLN
INTRAMUSCULAR | Status: AC
  Filled 2013-10-25: qty 5

## 2013-10-25 MED ORDER — OXYCODONE HCL ER 60 MG PO T12A: 1.0000 | EXTENDED_RELEASE_TABLET | Freq: Two times a day (BID) | ORAL | Status: DC

## 2013-10-25 MED ORDER — DEXAMETHASONE SODIUM PHOSPHATE 20 MG/5ML IJ SOLN
20.0000 mg | Freq: Once | INTRAMUSCULAR | Status: AC
  Administered 2013-10-25: 20 mg via INTRAVENOUS

## 2013-10-25 MED ORDER — ONDANSETRON 16 MG/50ML IVPB (CHCC)
INTRAVENOUS | Status: AC
  Filled 2013-10-25: qty 16

## 2013-10-25 MED ORDER — NICOTINE 21 MG/24HR TD PT24: 21.0000 mg | MEDICATED_PATCH | Freq: Every day | TRANSDERMAL | Status: DC

## 2013-10-25 MED ORDER — SODIUM CHLORIDE 0.9 % IV SOLN
750.0000 mg | Freq: Once | INTRAVENOUS | Status: AC
  Administered 2013-10-25: 750 mg via INTRAVENOUS
  Filled 2013-10-25: qty 75

## 2013-10-25 NOTE — Patient Instructions (Signed)
[facility] Discharge Instructions for Patients Receiving Chemotherapy  Today you received the following chemotherapy agents Alimta and Carboplatin.  To help prevent nausea and vomiting after your treatment, we encourage you to take your nausea medication.   If you develop nausea and vomiting that is not controlled by your nausea medication, call the clinic.   BELOW ARE SYMPTOMS THAT SHOULD BE REPORTED IMMEDIATELY:  *FEVER GREATER THAN 100.5 F  *CHILLS WITH OR WITHOUT FEVER  NAUSEA AND VOMITING THAT IS NOT CONTROLLED WITH YOUR NAUSEA MEDICATION  *UNUSUAL SHORTNESS OF BREATH  *UNUSUAL BRUISING OR BLEEDING  TENDERNESS IN MOUTH AND THROAT WITH OR WITHOUT PRESENCE OF ULCERS  *URINARY PROBLEMS  *BOWEL PROBLEMS  UNUSUAL RASH Items with * indicate a potential emergency and should be followed up as soon as possible.  Feel free to call the clinic you have any questions or concerns. The clinic phone number is [phone].
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
## 2013-10-25 NOTE — Telephone Encounter (Signed)
gv dn printed appts ched and avs for pt for May adn June

## 2013-10-28 NOTE — Patient Instructions (Signed)
Continue labs and chemotherapy as scheduled Followup in 3 weeks with restaging CT scan of the chest, abdomen and pelvis to reevaluate your disease

## 2013-11-01 ENCOUNTER — Telehealth: Payer: Self-pay | Admitting: *Deleted

## 2013-11-01 ENCOUNTER — Ambulatory Visit (HOSPITAL_BASED_OUTPATIENT_CLINIC_OR_DEPARTMENT_OTHER): Payer: Medicare Other | Admitting: *Deleted

## 2013-11-01 ENCOUNTER — Encounter: Payer: Self-pay | Admitting: Internal Medicine

## 2013-11-01 VITALS — BP 107/69 | HR 113 | Temp 96.9°F | Resp 16

## 2013-11-01 DIAGNOSIS — C7951 Secondary malignant neoplasm of bone: Secondary | ICD-10-CM

## 2013-11-01 DIAGNOSIS — C342 Malignant neoplasm of middle lobe, bronchus or lung: Secondary | ICD-10-CM

## 2013-11-01 DIAGNOSIS — C3490 Malignant neoplasm of unspecified part of unspecified bronchus or lung: Secondary | ICD-10-CM

## 2013-11-01 DIAGNOSIS — C7949 Secondary malignant neoplasm of other parts of nervous system: Secondary | ICD-10-CM

## 2013-11-01 DIAGNOSIS — C7952 Secondary malignant neoplasm of bone marrow: Secondary | ICD-10-CM

## 2013-11-01 DIAGNOSIS — E876 Hypokalemia: Secondary | ICD-10-CM

## 2013-11-01 LAB — COMPREHENSIVE METABOLIC PANEL (CC13)
ALBUMIN: 2.9 g/dL — AB (ref 3.5–5.0)
ALK PHOS: 153 U/L — AB (ref 40–150)
ALT: 26 U/L (ref 0–55)
ANION GAP: 15 meq/L — AB (ref 3–11)
AST: 18 U/L (ref 5–34)
BUN: 12 mg/dL (ref 7.0–26.0)
CO2: 34 meq/L — AB (ref 22–29)
Calcium: 8.7 mg/dL (ref 8.4–10.4)
Chloride: 86 mEq/L — ABNORMAL LOW (ref 98–109)
Creatinine: 0.7 mg/dL (ref 0.7–1.3)
GLUCOSE: 121 mg/dL (ref 70–140)
POTASSIUM: 2.7 meq/L — AB (ref 3.5–5.1)
Sodium: 135 mEq/L — ABNORMAL LOW (ref 136–145)
Total Bilirubin: 0.71 mg/dL (ref 0.20–1.20)
Total Protein: 7.4 g/dL (ref 6.4–8.3)

## 2013-11-01 LAB — CBC WITH DIFFERENTIAL/PLATELET
BASO%: 0.3 % (ref 0.0–2.0)
BASOS ABS: 0 10*3/uL (ref 0.0–0.1)
EOS ABS: 0.4 10*3/uL (ref 0.0–0.5)
EOS%: 4.1 % (ref 0.0–7.0)
HCT: 27.3 % — ABNORMAL LOW (ref 38.4–49.9)
HEMOGLOBIN: 9.1 g/dL — AB (ref 13.0–17.1)
LYMPH%: 5.7 % — AB (ref 14.0–49.0)
MCH: 28.6 pg (ref 27.2–33.4)
MCHC: 33.3 g/dL (ref 32.0–36.0)
MCV: 85.8 fL (ref 79.3–98.0)
MONO#: 0.3 10*3/uL (ref 0.1–0.9)
MONO%: 2.8 % (ref 0.0–14.0)
NEUT%: 87.1 % — ABNORMAL HIGH (ref 39.0–75.0)
NEUTROS ABS: 8.9 10*3/uL — AB (ref 1.5–6.5)
PLATELETS: 100 10*3/uL — AB (ref 140–400)
RBC: 3.18 10*6/uL — ABNORMAL LOW (ref 4.20–5.82)
RDW: 19.4 % — ABNORMAL HIGH (ref 11.0–14.6)
WBC: 10.2 10*3/uL (ref 4.0–10.3)
lymph#: 0.6 10*3/uL — ABNORMAL LOW (ref 0.9–3.3)

## 2013-11-01 MED ORDER — OXYCODONE HCL 10 MG PO TABS: ORAL_TABLET | ORAL | Status: DC

## 2013-11-01 MED ORDER — SODIUM CHLORIDE 0.9 % IJ SOLN
10.0000 mL | INTRAMUSCULAR | Status: DC | PRN
  Administered 2013-11-01: 10 mL via INTRAVENOUS
  Filled 2013-11-01: qty 10

## 2013-11-01 MED ORDER — HEPARIN SOD (PORK) LOCK FLUSH 100 UNIT/ML IV SOLN
500.0000 [IU] | Freq: Once | INTRAVENOUS | Status: AC
  Administered 2013-11-01: 500 [IU] via INTRAVENOUS
  Filled 2013-11-01: qty 5

## 2013-11-01 MED ORDER — POTASSIUM CHLORIDE CRYS ER 20 MEQ PO TBCR: 20.0000 meq | EXTENDED_RELEASE_TABLET | Freq: Two times a day (BID) | ORAL | Status: DC

## 2013-11-01 MED ORDER — FENTANYL 50 MCG/HR TD PT72: 50.0000 ug | MEDICATED_PATCH | TRANSDERMAL | Status: DC

## 2013-11-01 NOTE — Patient Instructions (Signed)

## 2013-11-01 NOTE — Telephone Encounter (Signed)
I went to lobby and pt was not there. I called mobile number and spoke to his father and told him [name] [name] needs to pick up kdur rx at pharmacy.  Dr Julien Nordmann wants to leave pain med regimen as is until he sees scan. He will address pain at next appt. Per Dr Julien Nordmann -ok for hospital bed.

## 2013-11-01 NOTE — Telephone Encounter (Signed)
In office for lab work and mother asking if her refill request for Duragesic/oxycodone was received yet so she can pick it up while here?

## 2013-11-01 NOTE — Telephone Encounter (Signed)
   Provider input needed: New Pain Left hip and right shoulder 8/10   Reason for call: Pain  Musculoskeletal:positive for back pain and new onset left hip and right shoulder pain rated 8/10-Started 4 days ago   ALLERGIES:  is allergic to codeine and morphine.   Patient last received chemotherapy/ treatment on 10/25/13  Patient was last seen in the office on 10/25/13  Next appt is 11/15/13   Is patient having fevers greater than 100.5?  no   Is patient having uncontrolled pain, or new pain? Yes-right shoulder-intermittent and sharp/left hip constant sharp   Is patient having new back pain that changes with position (worsens or eases when laying down?)  yes, eases with standing. Not responding to Duragesic patch 50 mcg and Oxycontin 60 mg every 12 hours and oxycodone 10 mg (4/day). Denies any recent fall/injury.    Is patient able to eat and drink? yes    Is patient able to pass stool without difficulty?   yes     Is patient having uncontrolled nausea?  no   Also requesting hospital bed and home health assistance for ADL's      Summary Based on the above information advised patient and family to wait in lobby for MD response.   Tania Ade  11/01/2013, 12:12 PM   Background Info  [name] [name]   DOB: 05-20-1966   MR#: [id number]   CSN#   [id number] 11/01/2013

## 2013-11-04 ENCOUNTER — Emergency Department (HOSPITAL_COMMUNITY): Payer: Medicare Other

## 2013-11-04 ENCOUNTER — Inpatient Hospital Stay (HOSPITAL_COMMUNITY)
Admission: EM | Admit: 2013-11-04 | Discharge: 2013-11-07 | DRG: 543 | Disposition: A | Discharge status: Home / Self Care | Payer: Medicare Other | Attending: Internal Medicine | Admitting: Internal Medicine

## 2013-11-04 ENCOUNTER — Encounter (HOSPITAL_COMMUNITY): Payer: Self-pay | Admitting: Emergency Medicine

## 2013-11-04 DIAGNOSIS — T887XXA Unspecified adverse effect of drug or medicament, initial encounter: Secondary | ICD-10-CM

## 2013-11-04 DIAGNOSIS — D696 Thrombocytopenia, unspecified: Secondary | ICD-10-CM | POA: Diagnosis present

## 2013-11-04 DIAGNOSIS — C7952 Secondary malignant neoplasm of bone marrow: Principal | ICD-10-CM

## 2013-11-04 DIAGNOSIS — E785 Hyperlipidemia, unspecified: Secondary | ICD-10-CM | POA: Diagnosis present

## 2013-11-04 DIAGNOSIS — Z6829 Body mass index (BMI) 29.0-29.9, adult: Secondary | ICD-10-CM

## 2013-11-04 DIAGNOSIS — K219 Gastro-esophageal reflux disease without esophagitis: Secondary | ICD-10-CM | POA: Diagnosis present

## 2013-11-04 DIAGNOSIS — K5900 Constipation, unspecified: Secondary | ICD-10-CM | POA: Diagnosis present

## 2013-11-04 DIAGNOSIS — D649 Anemia, unspecified: Secondary | ICD-10-CM | POA: Diagnosis present

## 2013-11-04 DIAGNOSIS — F17200 Nicotine dependence, unspecified, uncomplicated: Secondary | ICD-10-CM | POA: Diagnosis present

## 2013-11-04 DIAGNOSIS — K2270 Barrett's esophagus without dysplasia: Secondary | ICD-10-CM

## 2013-11-04 DIAGNOSIS — M549 Dorsalgia, unspecified: Secondary | ICD-10-CM

## 2013-11-04 DIAGNOSIS — R918 Other nonspecific abnormal finding of lung field: Secondary | ICD-10-CM

## 2013-11-04 DIAGNOSIS — T451X5A Adverse effect of antineoplastic and immunosuppressive drugs, initial encounter: Secondary | ICD-10-CM | POA: Diagnosis present

## 2013-11-04 DIAGNOSIS — M8448XA Pathological fracture, other site, initial encounter for fracture: Secondary | ICD-10-CM | POA: Diagnosis present

## 2013-11-04 DIAGNOSIS — R4182 Altered mental status, unspecified: Secondary | ICD-10-CM | POA: Diagnosis present

## 2013-11-04 DIAGNOSIS — C3490 Malignant neoplasm of unspecified part of unspecified bronchus or lung: Secondary | ICD-10-CM

## 2013-11-04 DIAGNOSIS — C7949 Secondary malignant neoplasm of other parts of nervous system: Secondary | ICD-10-CM | POA: Diagnosis present

## 2013-11-04 DIAGNOSIS — C7951 Secondary malignant neoplasm of bone: Secondary | ICD-10-CM

## 2013-11-04 DIAGNOSIS — D6481 Anemia due to antineoplastic chemotherapy: Secondary | ICD-10-CM | POA: Diagnosis present

## 2013-11-04 DIAGNOSIS — G8929 Other chronic pain: Secondary | ICD-10-CM | POA: Diagnosis present

## 2013-11-04 DIAGNOSIS — F4489 Other dissociative and conversion disorders: Secondary | ICD-10-CM

## 2013-11-04 DIAGNOSIS — E876 Hypokalemia: Secondary | ICD-10-CM | POA: Diagnosis present

## 2013-11-04 DIAGNOSIS — E871 Hypo-osmolality and hyponatremia: Secondary | ICD-10-CM | POA: Diagnosis present

## 2013-11-04 DIAGNOSIS — G893 Neoplasm related pain (acute) (chronic): Secondary | ICD-10-CM

## 2013-11-04 DIAGNOSIS — Z79899 Other long term (current) drug therapy: Secondary | ICD-10-CM

## 2013-11-04 DIAGNOSIS — J9801 Acute bronchospasm: Secondary | ICD-10-CM

## 2013-11-04 DIAGNOSIS — C342 Malignant neoplasm of middle lobe, bronchus or lung: Secondary | ICD-10-CM | POA: Diagnosis present

## 2013-11-04 DIAGNOSIS — T502X5A Adverse effect of carbonic-anhydrase inhibitors, benzothiadiazides and other diuretics, initial encounter: Secondary | ICD-10-CM | POA: Diagnosis present

## 2013-11-04 DIAGNOSIS — D6959 Other secondary thrombocytopenia: Secondary | ICD-10-CM | POA: Diagnosis present

## 2013-11-04 DIAGNOSIS — Z923 Personal history of irradiation: Secondary | ICD-10-CM

## 2013-11-04 DIAGNOSIS — F319 Bipolar disorder, unspecified: Secondary | ICD-10-CM

## 2013-11-04 DIAGNOSIS — R9389 Abnormal findings on diagnostic imaging of other specified body structures: Secondary | ICD-10-CM

## 2013-11-04 LAB — CBC WITH DIFFERENTIAL/PLATELET
Basophils Absolute: 0 10*3/uL (ref 0.0–0.1)
Basophils Relative: 0 % (ref 0–1)
EOS PCT: 4 % (ref 0–5)
Eosinophils Absolute: 0.5 10*3/uL (ref 0.0–0.7)
HEMATOCRIT: 22.3 % — AB (ref 39.0–52.0)
HEMOGLOBIN: 7.9 g/dL — AB (ref 13.0–17.0)
Lymphocytes Relative: 5 % — ABNORMAL LOW (ref 12–46)
Lymphs Abs: 0.6 10*3/uL — ABNORMAL LOW (ref 0.7–4.0)
MCH: 29.6 pg (ref 26.0–34.0)
MCHC: 35.4 g/dL (ref 30.0–36.0)
MCV: 83.5 fL (ref 78.0–100.0)
Monocytes Absolute: 0.8 10*3/uL (ref 0.1–1.0)
Monocytes Relative: 7 % (ref 3–12)
NEUTROS ABS: 9.4 10*3/uL — AB (ref 1.7–7.7)
NEUTROS PCT: 84 % — AB (ref 43–77)
Platelets: 29 10*3/uL — CL (ref 150–400)
RBC: 2.67 MIL/uL — AB (ref 4.22–5.81)
RDW: 19.2 % — ABNORMAL HIGH (ref 11.5–15.5)
WBC: 11.3 10*3/uL — ABNORMAL HIGH (ref 4.0–10.5)

## 2013-11-04 LAB — TSH: TSH: 0.716 u[IU]/mL (ref 0.350–4.500)

## 2013-11-04 LAB — URINALYSIS, ROUTINE W REFLEX MICROSCOPIC
Glucose, UA: NEGATIVE mg/dL
Hgb urine dipstick: NEGATIVE
KETONES UR: NEGATIVE mg/dL
Nitrite: NEGATIVE
Protein, ur: 30 mg/dL — AB
Specific Gravity, Urine: 1.02 (ref 1.005–1.030)
Urobilinogen, UA: 2 mg/dL — ABNORMAL HIGH (ref 0.0–1.0)
pH: 8 (ref 5.0–8.0)

## 2013-11-04 LAB — COMPREHENSIVE METABOLIC PANEL
ALK PHOS: 157 U/L — AB (ref 39–117)
ALT: 31 U/L (ref 0–53)
AST: 17 U/L (ref 0–37)
Albumin: 2.9 g/dL — ABNORMAL LOW (ref 3.5–5.2)
BUN: 14 mg/dL (ref 6–23)
CO2: 35 meq/L — AB (ref 19–32)
Calcium: 7.4 mg/dL — ABNORMAL LOW (ref 8.4–10.5)
Chloride: 83 mEq/L — ABNORMAL LOW (ref 96–112)
Creatinine, Ser: 0.53 mg/dL (ref 0.50–1.35)
GFR calc non Af Amer: >90 mL/min (ref >90)
Glucose, Bld: 119 mg/dL — ABNORMAL HIGH (ref 70–99)
POTASSIUM: 2.3 meq/L — AB (ref 3.7–5.3)
SODIUM: 132 meq/L — AB (ref 137–147)
Total Bilirubin: 1.1 mg/dL (ref 0.3–1.2)
Total Protein: 7.1 g/dL (ref 6.0–8.3)

## 2013-11-04 LAB — URINE MICROSCOPIC-ADD ON

## 2013-11-04 LAB — TROPONIN I: Troponin I: <0.3 ng/mL (ref <0.30)

## 2013-11-04 MED ORDER — SODIUM CHLORIDE 0.9 % IV BOLUS (SEPSIS)
1000.0000 mL | Freq: Once | INTRAVENOUS | Status: AC
  Administered 2013-11-04: 1000 mL via INTRAVENOUS

## 2013-11-04 MED ORDER — HYDROMORPHONE HCL PF 2 MG/ML IJ SOLN: 2.0000 mg | INTRAMUSCULAR | Status: DC | PRN

## 2013-11-04 MED ORDER — PANTOPRAZOLE SODIUM 40 MG PO TBEC
40.0000 mg | DELAYED_RELEASE_TABLET | Freq: Every day | ORAL | Status: DC
  Administered 2013-11-04 – 2013-11-07 (×4): 40 mg via ORAL
  Filled 2013-11-04 (×4): qty 1

## 2013-11-04 MED ORDER — ONDANSETRON HCL 4 MG PO TABS: 4.0000 mg | ORAL_TABLET | Freq: Four times a day (QID) | ORAL | Status: DC | PRN

## 2013-11-04 MED ORDER — DRONABINOL 2.5 MG PO CAPS
5.0000 mg | ORAL_CAPSULE | Freq: Two times a day (BID) | ORAL | Status: DC
  Administered 2013-11-05 – 2013-11-06 (×4): 5 mg via ORAL
  Filled 2013-11-04 (×4): qty 2

## 2013-11-04 MED ORDER — POTASSIUM CHLORIDE CRYS ER 20 MEQ PO TBCR
40.0000 meq | EXTENDED_RELEASE_TABLET | Freq: Once | ORAL | Status: AC
  Administered 2013-11-04: 40 meq via ORAL
  Filled 2013-11-04: qty 2

## 2013-11-04 MED ORDER — OXYCODONE HCL ER 20 MG PO T12A
60.0000 mg | EXTENDED_RELEASE_TABLET | Freq: Two times a day (BID) | ORAL | Status: DC
  Administered 2013-11-04 – 2013-11-07 (×7): 60 mg via ORAL
  Filled 2013-11-04 (×7): qty 3

## 2013-11-04 MED ORDER — SODIUM CHLORIDE 0.9 % IJ SOLN
3.0000 mL | Freq: Two times a day (BID) | INTRAMUSCULAR | Status: DC
  Administered 2013-11-04: 3 mL via INTRAVENOUS

## 2013-11-04 MED ORDER — POTASSIUM CHLORIDE 10 MEQ/100ML IV SOLN
10.0000 meq | Freq: Once | INTRAVENOUS | Status: AC
  Administered 2013-11-04: 10 meq via INTRAVENOUS
  Filled 2013-11-04: qty 100

## 2013-11-04 MED ORDER — FENTANYL 50 MCG/HR TD PT72: 50.0000 ug | MEDICATED_PATCH | TRANSDERMAL | Status: DC

## 2013-11-04 MED ORDER — SODIUM CHLORIDE 0.9 % IJ SOLN
10.0000 mL | INTRAMUSCULAR | Status: DC | PRN
  Administered 2013-11-05 – 2013-11-07 (×4): 10 mL

## 2013-11-04 MED ORDER — ONDANSETRON HCL 4 MG/2ML IJ SOLN: 4.0000 mg | Freq: Four times a day (QID) | INTRAMUSCULAR | Status: DC | PRN

## 2013-11-04 MED ORDER — MAGNESIUM SULFATE 40 MG/ML IJ SOLN
2.0000 g | Freq: Once | INTRAMUSCULAR | Status: AC
  Administered 2013-11-04: 2 g via INTRAVENOUS
  Filled 2013-11-04: qty 50

## 2013-11-04 MED ORDER — DOCUSATE SODIUM 100 MG PO CAPS
100.0000 mg | ORAL_CAPSULE | Freq: Two times a day (BID) | ORAL | Status: DC
  Administered 2013-11-04 – 2013-11-07 (×5): 100 mg via ORAL
  Filled 2013-11-04 (×7): qty 1

## 2013-11-04 MED ORDER — SODIUM CHLORIDE 0.9 % IV SOLN
INTRAVENOUS | Status: DC
  Administered 2013-11-04: 08:00:00 via INTRAVENOUS

## 2013-11-04 MED ORDER — POTASSIUM CHLORIDE CRYS ER 20 MEQ PO TBCR
60.0000 meq | EXTENDED_RELEASE_TABLET | Freq: Four times a day (QID) | ORAL | Status: AC
  Administered 2013-11-04 (×3): 60 meq via ORAL
  Filled 2013-11-04 (×3): qty 3

## 2013-11-04 MED ORDER — OXYCODONE HCL 5 MG PO TABS
10.0000 mg | ORAL_TABLET | ORAL | Status: DC | PRN
  Administered 2013-11-04: 10 mg via ORAL
  Filled 2013-11-04 (×2): qty 2

## 2013-11-04 MED ORDER — ALUM & MAG HYDROXIDE-SIMETH 200-200-20 MG/5ML PO SUSP: 30.0000 mL | Freq: Four times a day (QID) | ORAL | Status: DC | PRN

## 2013-11-04 MED ORDER — LORAZEPAM 2 MG/ML IJ SOLN
1.0000 mg | Freq: Once | INTRAMUSCULAR | Status: AC
  Administered 2013-11-04: 1 mg via INTRAVENOUS
  Filled 2013-11-04: qty 1

## 2013-11-04 MED ORDER — POLYETHYLENE GLYCOL 3350 17 G PO PACK
17.0000 g | PACK | Freq: Two times a day (BID) | ORAL | Status: DC
  Administered 2013-11-04 – 2013-11-05 (×2): 17 g via ORAL
  Filled 2013-11-04 (×7): qty 1

## 2013-11-04 MED ORDER — OXYCODONE HCL ER 60 MG PO T12A: 1.0000 | EXTENDED_RELEASE_TABLET | Freq: Two times a day (BID) | ORAL | Status: DC

## 2013-11-04 MED ORDER — ACETAMINOPHEN 325 MG PO TABS: 650.0000 mg | ORAL_TABLET | Freq: Four times a day (QID) | ORAL | Status: DC | PRN

## 2013-11-04 MED ORDER — QUETIAPINE FUMARATE 300 MG PO TABS
600.0000 mg | ORAL_TABLET | Freq: Every day | ORAL | Status: DC
  Administered 2013-11-04 – 2013-11-05 (×2): 600 mg via ORAL
  Filled 2013-11-04 (×4): qty 2

## 2013-11-04 MED ORDER — OXYCODONE HCL 5 MG PO TABS
20.0000 mg | ORAL_TABLET | ORAL | Status: DC | PRN
  Administered 2013-11-05 – 2013-11-07 (×8): 20 mg via ORAL
  Filled 2013-11-04 (×8): qty 4

## 2013-11-04 MED ORDER — ACETAMINOPHEN 650 MG RE SUPP: 650.0000 mg | Freq: Four times a day (QID) | RECTAL | Status: DC | PRN
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
## 2013-11-04 NOTE — Progress Notes (Signed)
UR completed 

## 2013-11-04 NOTE — ED Notes (Signed)
Pt states that he has cancer of the spine and is having increase back pain that began around 3am; pt states that he is afraid that the oxycontin is causing panic attacks and he is afraid to take it; pt states that he has takes oxycontin and oxycodone in the past without any problems but he feels like tonight that it has caused panic attacks.
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
## 2013-11-04 NOTE — Progress Notes (Signed)
Bed alarm on.

## 2013-11-04 NOTE — Progress Notes (Signed)
Pt in bed resting resp even non-labored . Bed alarm on. No distress noted

## 2013-11-04 NOTE — ED Notes (Signed)
Patient transported to X-ray 
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
## 2013-11-05 ENCOUNTER — Inpatient Hospital Stay (HOSPITAL_COMMUNITY): Payer: Medicare Other

## 2013-11-05 DIAGNOSIS — D649 Anemia, unspecified: Secondary | ICD-10-CM

## 2013-11-05 DIAGNOSIS — C7951 Secondary malignant neoplasm of bone: Principal | ICD-10-CM

## 2013-11-05 DIAGNOSIS — R7989 Other specified abnormal findings of blood chemistry: Secondary | ICD-10-CM

## 2013-11-05 DIAGNOSIS — C7952 Secondary malignant neoplasm of bone marrow: Principal | ICD-10-CM

## 2013-11-05 DIAGNOSIS — D696 Thrombocytopenia, unspecified: Secondary | ICD-10-CM

## 2013-11-05 DIAGNOSIS — E876 Hypokalemia: Secondary | ICD-10-CM

## 2013-11-05 LAB — CBC
HCT: 18.1 % — ABNORMAL LOW (ref 39.0–52.0)
HCT: 26.5 % — ABNORMAL LOW (ref 39.0–52.0)
Hemoglobin: 6.1 g/dL — CL (ref 13.0–17.0)
Hemoglobin: 9.2 g/dL — ABNORMAL LOW (ref 13.0–17.0)
MCH: 29.2 pg (ref 26.0–34.0)
MCH: 30 pg (ref 26.0–34.0)
MCHC: 33.7 g/dL (ref 30.0–36.0)
MCHC: 34.7 g/dL (ref 30.0–36.0)
MCV: 86.6 fL (ref 78.0–100.0)
MCV: 86.3 fL (ref 78.0–100.0)
PLATELETS: 16 10*3/uL — AB (ref 150–400)
Platelets: 20 10*3/uL — CL (ref 150–400)
RBC: 2.09 MIL/uL — ABNORMAL LOW (ref 4.22–5.81)
RBC: 3.07 MIL/uL — AB (ref 4.22–5.81)
RDW: 19.6 % — AB (ref 11.5–15.5)
RDW: 17.5 % — ABNORMAL HIGH (ref 11.5–15.5)
WBC: 7.1 10*3/uL (ref 4.0–10.5)
WBC: 8.7 10*3/uL (ref 4.0–10.5)

## 2013-11-05 LAB — BASIC METABOLIC PANEL
BUN: 7 mg/dL (ref 6–23)
CALCIUM: 7.5 mg/dL — AB (ref 8.4–10.5)
CO2: 30 mEq/L (ref 19–32)
CREATININE: 0.5 mg/dL (ref 0.50–1.35)
Chloride: 96 mEq/L (ref 96–112)
GFR calc non Af Amer: >90 mL/min (ref >90)
Glucose, Bld: 99 mg/dL (ref 70–99)
Potassium: 3 mEq/L — ABNORMAL LOW (ref 3.7–5.3)
Sodium: 136 mEq/L — ABNORMAL LOW (ref 137–147)

## 2013-11-05 LAB — PREPARE RBC (CROSSMATCH)

## 2013-11-05 LAB — MAGNESIUM: MAGNESIUM: 1.9 mg/dL (ref 1.5–2.5)

## 2013-11-05 MED ORDER — POTASSIUM CHLORIDE CRYS ER 20 MEQ PO TBCR
40.0000 meq | EXTENDED_RELEASE_TABLET | Freq: Once | ORAL | Status: AC
  Administered 2013-11-05: 40 meq via ORAL
  Filled 2013-11-05: qty 2

## 2013-11-05 MED ORDER — FUROSEMIDE 10 MG/ML IJ SOLN
20.0000 mg | Freq: Once | INTRAMUSCULAR | Status: AC
  Administered 2013-11-05: 20 mg via INTRAVENOUS
  Filled 2013-11-05: qty 2

## 2013-11-05 MED ORDER — ACETAMINOPHEN 325 MG PO TABS
650.0000 mg | ORAL_TABLET | Freq: Once | ORAL | Status: AC
  Administered 2013-11-05: 650 mg via ORAL
  Filled 2013-11-05: qty 2

## 2013-11-05 MED ORDER — IOHEXOL 300 MG/ML  SOLN
50.0000 mL | Freq: Once | INTRAMUSCULAR | Status: AC | PRN
  Administered 2013-11-05: 50 mL via ORAL

## 2013-11-05 MED ORDER — IOHEXOL 300 MG/ML  SOLN
100.0000 mL | Freq: Once | INTRAMUSCULAR | Status: AC | PRN
  Administered 2013-11-05: 100 mL via INTRAVENOUS

## 2013-11-05 NOTE — Progress Notes (Signed)
Mr. [name] [name] is doing better. He slept fairly well last night.  his hemoglobin is 6.1. We will go ahead and transfuse him 2 units of blood.  His potassium is 3.0.  He will be going for his CT scans today.  There is no cough. He has had no nausea or vomiting.  His blood pressure is 100/50. Heart rate 109. Temperature 98.9. Lung is clear. Cardiac exam tachycardic regular. Abdomen is soft.  We will continue to follow along. Hopefully, the blood transfusion will help him a little bit. It will be very interesting to see what the CT scan shows.  I very much appreciate a great care that he is getting from the staff on [place] and from the hospitalist.  Hopefully, he'll be able to come off the cardiac monitor as his potassium improves.  Stuart 55:22

## 2013-11-05 NOTE — Progress Notes (Signed)
CRITICAL VALUE ALERT  Critical value received:  Hgb 6.1 and Platelet 16  Date of notification:  11/05/2013  Time of notification:  0525  Critical value read back: yes  Nurse who received alert:  Tyson Babinski, RN  MD notified (1st page):  Walden Field NP  Time of first page:  [phone]  Responding MD:  Walden Field NP  Time MD responded:  [phone]

## 2013-11-05 NOTE — Progress Notes (Signed)
PROGRESS NOTE    [name] [name] VEH:[id number] DOB: 08-24-1965 DOA: 11/04/2013 PCP: [name], MD  HPI/Brief narrative 48 y.o. male with past medical history with recently diagnosed stage IV NSCLC, follows with Dr. Julien Nordmann of the regional [place]. Patient had 3 cycles of systemic chemotherapy with carboplatin and Alimta, last one was on 5/19. Patient came in to the hospital because of increasing back pain, please note that patient has known metastasis to his back with pathological fracture in T5 and T6. He also had some altered mental status in the context of pain medications. In the ED found to have low potassium of 2.3, anemic with hemoglobin of 7.9 and platelets of 29. Patient admitted to the hospital for further evaluation.   Assessment/Plan:  1. Acute on chronic back pain: Likely secondary to metastatic disease. Since admission, pain control is improved. Repeat CT chest abdomen and pelvis shows improvement. 2. Severe anemia and thrombocytopenia: Secondary to chemotherapy. Hemoglobin has dropped from 7.9 on admission to 6.1. Transfuse 2 units of PRBCs and follow CBCs. Platelets 16-transfuse if less than 10 or and develops bleeding. Oncology input appreciated. 3. Hypokalemia: Secondary to diuretics. Improving. Continue to replace and follow BMP. Magnesium 1.9. 4. Stage IV non-small cell lung cancer: Repeat CT chest abdomen and pelvis shows improvement. Management per oncology. 5. Transient altered mental status: Related to pain medications. Seems to have improved. Monitor closely. 6. History of bipolar disorder/depression: Continue Seroquel   Code Status: Full Family Communication: None at bedside. Disposition Plan: Home when medically stable.   Consultants:  Medical Oncology.  Procedures:  Has previous Port-A-Cath.   Antibiotics:   None   Subjective:  patient states that his back pain has improved. Denies any other complaints. As per nursing, no acute  events.  Objective: Filed Vitals:   11/04/13 2109 11/05/13 0536 11/05/13 1152 11/05/13 1210  BP: 107/59 100/50 98/55 104/48  Pulse: 102 109 95 93  Temp: 98.4 F (36.9 C) 98.9 F (37.2 C) 98.3 F (36.8 C) 98 F (36.7 C)  TempSrc: Oral Oral Oral Oral  Resp: 16 16 18 14   Height:      Weight:      SpO2: 93% 93% 95%     Intake/Output Summary (Last 24 hours) at 11/05/13 1246 Last data filed at 11/05/13 1155  Gross per 24 hour  Intake    800 ml  Output   1150 ml  Net   -350 ml   Filed Weights   11/04/13 1106  Weight: 94.6 kg (208 lb 8.9 oz)     Exam:  General exam:  pleasant young male lying comfortably in bed. Respiratory system: Clear. No increased work of breathing. Cardiovascular system: S1 & S2 heard, RRR. No JVD, murmurs, gallops, clicks. Trace bilateral ankle edema. Telemetry: Sinus tachycardia in the 100s . Gastrointestinal system: Abdomen is nondistended, soft and nontender. Normal bowel sounds heard. Central nervous system: Alert and oriented. No focal neurological deficits. Extremities: Symmetric 5 x 5 power.   Data Reviewed: Basic Metabolic Panel:  Recent Labs Lab 11/01/13 1140 11/04/13 0725 11/05/13 0412  NA 135* 132* 136*  K 2.7* 2.3* 3.0*  CL  --  83* 96  CO2 34* 35* 30  GLUCOSE 121 119* 99  BUN 12.0 14 7  CREATININE 0.7 0.53 0.50  CALCIUM 8.7 7.4* 7.5*  MG  --   --  1.9   Liver Function Tests:  Recent Labs Lab 11/01/13 1140 11/04/13 0725  AST 18 17  ALT 26 31  ALKPHOS 153* 157*  BILITOT 0.71 1.1  PROT 7.4 7.1  ALBUMIN 2.9* 2.9*   No results found for this basename: LIPASE, AMYLASE,  in the last 168 hours No results found for this basename: AMMONIA,  in the last 168 hours CBC:  Recent Labs Lab 11/01/13 1140 11/04/13 0725 11/05/13 0412  WBC 10.2 11.3* 7.1  NEUTROABS 8.9* 9.4*  --   HGB 9.1* 7.9* 6.1*  HCT 27.3* 22.3* 18.1*  MCV 85.8 83.5 86.6  PLT 100* 29* 16*   Cardiac Enzymes:  Recent Labs Lab 11/04/13 0811   TROPONINI <0.30   BNP (last 3 results) No results found for this basename: PROBNP,  in the last 8760 hours CBG: No results found for this basename: GLUCAP,  in the last 168 hours  No results found for this or any previous visit (from the past 240 hour(s)).     Studies: Dg Chest 2 View  11/04/2013   CLINICAL DATA:  Chest pain  EXAM: CHEST  2 VIEW  COMPARISON:  August 26, 2013  FINDINGS: Port-A-Cath tip is at the cavoatrial junction. No pneumothorax. There is patchy atelectasis in both lower lobes. Elsewhere lungs are clear. Heart size and pulmonary vascularity are normal. No adenopathy. There is evidence of old rib trauma involving the anterior left third rib, stable.  IMPRESSION: Patchy bibasilar atelectasis.  Elsewhere lungs are clear.   Electronically Signed   By: Lowella Grip M.D.   On: 11/04/2013 08:19   Ct Chest W Contrast  11/05/2013   CLINICAL DATA:  Restaging lung cancer  EXAM: CT CHEST, ABDOMEN, AND PELVIS WITH CONTRAST  TECHNIQUE: Multidetector CT imaging of the chest, abdomen and pelvis was performed following the standard protocol during bolus administration of intravenous contrast.  CONTRAST:  82mL OMNIPAQUE IOHEXOL 300 MG/ML SOLN, 171mL OMNIPAQUE IOHEXOL 300 MG/ML SOLN  COMPARISON:  CT chest 08/11/2013.  CT abdomen pelvis from 08/25/2013  FINDINGS: CT CHEST FINDINGS  The heart size appears normal. There is no pericardial effusion. Index right paratracheal lymph node measures 1.1 cm, image 22/ series 2. Previously 1.3 cm. Index right hilar lymph node measures 0.9 cm, image 29/ series 2. Previously 1.5 cm. The index sub- carinal lymph node measures 0.9 cm, image 30/ series 2. Previously 1.3 cm. No new or enlarging mediastinal or hilar lymph nodes. There is no axillary or supraclavicular adenopathy.  No pleural effusion identified. The right middle lobe tumor measures 1.1 x 1.6 cm, image 38/ series 2. This is compared with 2.3 x 2.8 cm previously. 4 mm nodule in the left upper lobe  is identified, image 24/ series 4. Previously 0.5 cm. No new or enlarging pulmonary nodules or masses.  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 There appears to be increased in mineralization associated with the previous lytic metastasis suggesting interval response to therapy. Interval Expansile lesion involving the anterior aspect of the left second rib measures 2.5 cm, image 22/ series 4. This is compared with 1.4 cm previously.  CT ABDOMEN AND PELVIS FINDINGS  No focal liver abnormality identified. The gallbladder appears normal. No biliary dilatation. Normal appearance of the pancreas. The spleen is on unremarkable.  The adrenal glands both appear normal. The kidneys are on unremarkable. Urinary bladder appears normal. Prostate gland and seminal vesicles are unremarkable.  There is calcified atherosclerotic disease involving the abdominal aorta. No aneurysm identified. No abdominal adenopathy noted. There is no pelvic or inguinal adenopathy.  No free fluid or fluid collections identified within the  abdomen or pelvis.  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 Index lesion within the central portion of the sacrum measures 3.4 cm, image 105/series 2. Previously 3.3 cm. Expansile lesion within the left iliac wing measures 2.2 cm, image 107/series 2. Previously 2.3 cm. Right iliac wing lesion measures 2 cm, image 99/series 2. Previously 2.2 cm.  IMPRESSION: 1. Interval response to therapy. There has been decrease in size of right middle lobe tumor and associated mediastinal and hilar lymph node metastasis. 2. Increase in mineralization associated with multi focal thoracic metastasis suggesting partial healing of previous lytic metastasis. 3. No evidence for soft tissue metastasis within the abdomen or pelvis. 4. 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    Electronically Signed   By: Kerby Moors M.D.   On: 11/05/2013 09:49   Ct Abdomen Pelvis W Contrast  11/05/2013   CLINICAL DATA:  Restaging lung cancer  EXAM: CT CHEST, ABDOMEN, AND PELVIS WITH CONTRAST  TECHNIQUE: Multidetector CT imaging of the chest, abdomen and pelvis was performed following the standard protocol during bolus administration of intravenous contrast.  CONTRAST:  78mL OMNIPAQUE IOHEXOL 300 MG/ML SOLN, 160mL OMNIPAQUE IOHEXOL 300 MG/ML SOLN  COMPARISON:  CT chest 08/11/2013.  CT abdomen pelvis from 08/25/2013  FINDINGS: CT CHEST FINDINGS  The heart size appears normal. There is no pericardial effusion. Index right paratracheal lymph node measures 1.1 cm, image 22/ series 2. Previously 1.3 cm. Index right hilar lymph node measures 0.9 cm, image 29/ series 2. Previously 1.5 cm. The index sub- carinal lymph node measures 0.9 cm, image 30/ series 2. Previously 1.3 cm. No new or enlarging mediastinal or hilar lymph nodes. There is no axillary or supraclavicular adenopathy.  No pleural effusion identified. The right middle lobe tumor measures 1.1 x 1.6 cm, image 38/ series 2. This is compared with 2.3 x 2.8 cm previously. 4 mm nodule in the left upper lobe is identified, image 24/ series 4. Previously 0.5 cm. No new or enlarging pulmonary nodules or masses.  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 There appears to be increased in mineralization associated with the previous lytic metastasis suggesting interval response to therapy. Interval Expansile lesion involving the anterior aspect of the left second rib measures 2.5 cm, image 22/ series 4. This is compared with 1.4 cm previously.  CT ABDOMEN AND PELVIS FINDINGS  No focal liver abnormality identified. The gallbladder appears normal. No biliary dilatation. Normal appearance of the pancreas. The spleen is on unremarkable.  The adrenal glands both appear normal. The kidneys are on unremarkable. Urinary bladder appears  normal. Prostate gland and seminal vesicles are unremarkable.  There is calcified atherosclerotic disease involving the abdominal aorta. No aneurysm identified. No abdominal adenopathy noted. There is no pelvic or inguinal adenopathy.  No free fluid or fluid collections identified within the abdomen or pelvis.  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 Index lesion within the central portion of the sacrum measures 3.4 cm, image 105/series 2. Previously 3.3 cm. Expansile lesion within the left iliac wing measures 2.2 cm, image 107/series 2. Previously 2.3 cm. Right iliac wing lesion measures 2 cm, image 99/series 2. Previously 2.2 cm.  IMPRESSION: 1. Interval response to therapy. There has been decrease in size of right middle lobe tumor and associated mediastinal and hilar lymph node metastasis. 2. Increase  in mineralization associated with multi focal thoracic metastasis suggesting partial healing of previous lytic metastasis. 3. No evidence for soft tissue metastasis within the abdomen or pelvis. 4. 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   Electronically Signed   By: Kerby Moors M.D.   On: 11/05/2013 09:49        Scheduled Meds: . docusate sodium  100 mg Oral BID  . dronabinol  5 mg Oral BID AC  . furosemide  20 mg Intravenous Once  . OxyCODONE  60 mg Oral Q12H  . pantoprazole  40 mg Oral Daily  . polyethylene glycol  17 g Oral BID  . QUEtiapine  600 mg Oral QHS  . sodium chloride  3 mL Intravenous Q12H   Continuous Infusions:   Principal Problem:   Hypokalemia Active Problems:   Anemia   Chronic back pain   Metastatic adenocarcinoma to spinal cord   Lung cancer, middle lobe   Thrombocytopenia    Time spent:  25 minutes    Modena Jansky, MD, FACP, [facility]. [facility] Pager [phone]  If 7PM-7AM, please contact  night-coverage [email or web address] Password [email or web address] 11/05/2013, 12:46 PM    LOS: 1 day

## 2013-11-06 DIAGNOSIS — M549 Dorsalgia, unspecified: Secondary | ICD-10-CM

## 2013-11-06 DIAGNOSIS — G8929 Other chronic pain: Secondary | ICD-10-CM

## 2013-11-06 LAB — BASIC METABOLIC PANEL
BUN: 7 mg/dL (ref 6–23)
CO2: 29 mEq/L (ref 19–32)
CREATININE: 0.72 mg/dL (ref 0.50–1.35)
Calcium: 8.1 mg/dL — ABNORMAL LOW (ref 8.4–10.5)
Chloride: 98 mEq/L (ref 96–112)
GFR calc Af Amer: >90 mL/min (ref >90)
Glucose, Bld: 111 mg/dL — ABNORMAL HIGH (ref 70–99)
Potassium: 3.2 mEq/L — ABNORMAL LOW (ref 3.7–5.3)
SODIUM: 139 meq/L (ref 137–147)

## 2013-11-06 LAB — TYPE AND SCREEN
ABO/RH(D): O POS
ANTIBODY SCREEN: NEGATIVE
Unit division: 0
Unit division: 0

## 2013-11-06 LAB — CBC
HCT: 25.3 % — ABNORMAL LOW (ref 39.0–52.0)
Hemoglobin: 8.7 g/dL — ABNORMAL LOW (ref 13.0–17.0)
MCH: 30.2 pg (ref 26.0–34.0)
MCHC: 34.4 g/dL (ref 30.0–36.0)
MCV: 87.8 fL (ref 78.0–100.0)
PLATELETS: 21 10*3/uL — AB (ref 150–400)
RBC: 2.88 MIL/uL — ABNORMAL LOW (ref 4.22–5.81)
RDW: 17.8 % — AB (ref 11.5–15.5)
WBC: 8.5 10*3/uL (ref 4.0–10.5)

## 2013-11-06 MED ORDER — POTASSIUM CHLORIDE CRYS ER 10 MEQ PO TBCR
30.0000 meq | EXTENDED_RELEASE_TABLET | ORAL | Status: AC
  Administered 2013-11-06 (×2): 30 meq via ORAL
  Filled 2013-11-06 (×2): qty 1

## 2013-11-06 MED ORDER — LORAZEPAM 1 MG PO TABS
1.0000 mg | ORAL_TABLET | Freq: Once | ORAL | Status: AC
  Administered 2013-11-06: 1 mg via ORAL
  Filled 2013-11-06: qty 1

## 2013-11-06 NOTE — Evaluation (Signed)
Physical Therapy Evaluation Patient Details Name: [name] [name] MRN: [id number] DOB: 1966-01-02 Today's Date: 11/06/2013   History of Present Illness  48 yo male admitted with hypokalemia, pain. Hx of lung cancer with mets to spine, pelvis, undergoing chemo, pathologic T5, T6 fractures, bipolar d/o, GERD, compression. Pt lives alone  Clinical Impression  On eval, pt required Min guard assist for mobility-able to ambulate ~200 feet with use of hallway handrail. Pt declined use of walker. No LOB during session. Mobility remains limited by pain-pt rates 8/10. Pt plans to return home with mother assisting as needed. Pt could potentially benefit from home health aide, if possible.     Follow Up Recommendations No PT follow up;Supervision - Intermittent    Equipment Recommendations  None recommended by PT    Recommendations for Other Services       Precautions / Restrictions Precautions Precautions: Fall Restrictions Weight Bearing Restrictions: No      Mobility  Bed Mobility               General bed mobility comments: pt standing at EOB  Transfers                 General transfer comment: pt standing at EOB. Stated he preferred to stand for a while after ambulating-nursing aware of  this.   Ambulation/Gait Ambulation/Gait assistance: Min guard Ambulation Distance (Feet): 200 Feet Assistive device:  (hallway handrail) Gait Pattern/deviations: Decreased stride length     General Gait Details: pt declined use of walker-preferred to use handrail mostly. slow gait speed. close guard for safety. Pt rated pain 8/10.   Stairs            Wheelchair Mobility    Modified Rankin (Stroke Patients Only)       Balance                                             Pertinent Vitals/Pain 8/10 back. Medicated prior to PT arrival.    Home Living Family/patient expects to be discharged to:: Private residence Living Arrangements: Alone Available  Help at Discharge: Family (mother lives down the street) Type of Home: House Home Access: Stairs to enter Entrance Stairs-Rails: Right Entrance Stairs-Number of Steps: [place]: One level Home Equipment: Environmental consultant - 2 wheels      Prior Function Level of Independence: Needs assistance      ADL's / Homemaking Assistance Needed: mother has been assisting with household        Hand Dominance        Extremity/Trunk Assessment   Upper Extremity Assessment: Overall WFL for tasks assessed           Lower Extremity Assessment: Generalized weakness      Cervical / Trunk Assessment: Normal  Communication   Communication: No difficulties  Cognition Arousal/Alertness: Awake/alert Behavior During Therapy: WFL for tasks assessed/performed Overall Cognitive Status: Within Functional Limits for tasks assessed                      General Comments      Exercises        Assessment/Plan    PT Assessment Patient needs continued PT services  PT Diagnosis Difficulty walking;Abnormality of gait   PT Problem List Pain;Decreased mobility;Decreased activity tolerance  PT Treatment Interventions Gait training;DME instruction;Functional mobility training;Therapeutic exercise;Therapeutic activities;Stair training;Balance training;Patient/family education  PT Goals (Current goals can be found in the Care Plan section) Acute Rehab PT Goals Patient Stated Goal: less pain PT Goal Formulation: With patient Time For Goal Achievement: 11/20/13 Potential to Achieve Goals: Fair    Frequency Min 3X/week   Barriers to discharge        Co-evaluation               End of Session   Activity Tolerance: Patient limited by pain Patient left:  (standing at EOB in room. Nursing aware of this)           Time: 1311-1315 PT Time Calculation (min): 4 min   Charges:   PT Evaluation $Initial PT Evaluation Tier I: 1 Procedure     PT G Codes:          Weston Anna, MPT Pager: [phone]

## 2013-11-06 NOTE — Progress Notes (Signed)
PROGRESS NOTE    [name] [name] KNL:[id number] DOB: Nov 03, 1965 DOA: 11/04/2013 PCP: [name], MD  HPI/Brief narrative 48 y.o. male with past medical history with recently diagnosed stage IV NSCLC, follows with Dr. Julien Nordmann of the regional [place]. Patient had 3 cycles of systemic chemotherapy with carboplatin and Alimta, last one was on 5/19. Patient came in to the hospital because of increasing back pain, please note that patient has known metastasis to his back with pathological fracture in T5 and T6. He also had some altered mental status in the context of pain medications. In the ED found to have low potassium of 2.3, anemic with hemoglobin of 7.9 and platelets of 29. Patient admitted to the hospital for further evaluation.   Assessment/Plan:  1. Acute on chronic back pain: Likely secondary to metastatic disease. Since admission, pain control is improved. Repeat CT chest abdomen and pelvis shows improvement. Controlled. 2. Severe anemia and thrombocytopenia: Secondary to chemotherapy. Hemoglobin had dropped from 7.9 on admission to 6.1. S/p  2 units of PRBCs and improved. Platelets 16 >20>21-transfuse if less than 10 or and develops bleeding. Oncology input appreciated. 3. Hypokalemia: Secondary to diuretics. Improving. Continue to replace and follow BMP. Magnesium 1.9. 4. Stage IV non-small cell lung cancer: Repeat CT chest abdomen and pelvis shows improvement. Management per oncology. 5. Transient altered mental status: Related to pain medications. Seems to have improved/resolved. Monitor closely. 6. History of bipolar disorder/depression: Continue Seroquel   Code Status: Full Family Communication: None at bedside. Disposition Plan: Home when medically stable - ?6/1.   Consultants:  Medical Oncology.  Procedures:  Has previous Port-A-Cath.   Antibiotics:   None   Subjective: Back pain controlled. Denies any other complaints.  Objective: Filed Vitals:   11/05/13 1800 11/05/13 1900 11/05/13 2116 11/06/13 0545  BP: 103/63 107/67 114/71 112/70  Pulse: 95 101 113 110  Temp: 98.1 F (36.7 C) 97.9 F (36.6 C) 98 F (36.7 C) 98.4 F (36.9 C)  TempSrc: Oral Oral Oral Oral  Resp: 14 12 16 22   Height:      Weight:      SpO2:   99% 95%    Intake/Output Summary (Last 24 hours) at 11/06/13 1222 Last data filed at 11/06/13 0819  Gross per 24 hour  Intake   1665 ml  Output   1350 ml  Net    315 ml   Filed Weights   11/04/13 1106  Weight: 94.6 kg (208 lb 8.9 oz)     Exam:  General exam:  pleasant young male lying comfortably in bed. Respiratory system: Clear. No increased work of breathing. Cardiovascular system: S1 & S2 heard, RRR. No JVD, murmurs, gallops, clicks. Trace bilateral ankle edema. Telemetry: Sinus rhythm in the 90s-sinus tachycardia in the 110s. Occasional sinus tachycardia in 140s-? Activity related. No arrhythmia alarms. (DC telemetry 5/31) Gastrointestinal system: Abdomen is nondistended, soft and nontender. Normal bowel sounds heard. Central nervous system: Alert and oriented. No focal neurological deficits. Extremities: Symmetric 5 x 5 power.   Data Reviewed: Basic Metabolic Panel:  Recent Labs Lab 11/01/13 1140 11/04/13 0725 11/05/13 0412 11/06/13 0519  NA 135* 132* 136* 139  K 2.7* 2.3* 3.0* 3.2*  CL  --  83* 96 98  CO2 34* 35* 30 29  GLUCOSE 121 119* 99 111*  BUN 12.0 14 7 7   CREATININE 0.7 0.53 0.50 0.72  CALCIUM 8.7 7.4* 7.5* 8.1*  MG  --   --  1.9  --  Liver Function Tests:  Recent Labs Lab 11/01/13 1140 11/04/13 0725  AST 18 17  ALT 26 31  ALKPHOS 153* 157*  BILITOT 0.71 1.1  PROT 7.4 7.1  ALBUMIN 2.9* 2.9*   No results found for this basename: LIPASE, AMYLASE,  in the last 168 hours No results found for this basename: AMMONIA,  in the last 168 hours CBC:  Recent Labs Lab 11/01/13 1140 11/04/13 0725 11/05/13 0412 11/05/13 2200 11/06/13 0519  WBC 10.2 11.3* 7.1 8.7 8.5    NEUTROABS 8.9* 9.4*  --   --   --   HGB 9.1* 7.9* 6.1* 9.2* 8.7*  HCT 27.3* 22.3* 18.1* 26.5* 25.3*  MCV 85.8 83.5 86.6 86.3 87.8  PLT 100* 29* 16* 20* 21*   Cardiac Enzymes:  Recent Labs Lab 11/04/13 0811  TROPONINI <0.30   BNP (last 3 results) No results found for this basename: PROBNP,  in the last 8760 hours CBG: No results found for this basename: GLUCAP,  in the last 168 hours  No results found for this or any previous visit (from the past 240 hour(s)).     Studies: Ct Chest W Contrast  11/05/2013   CLINICAL DATA:  Restaging lung cancer  EXAM: CT CHEST, ABDOMEN, AND PELVIS WITH CONTRAST  TECHNIQUE: Multidetector CT imaging of the chest, abdomen and pelvis was performed following the standard protocol during bolus administration of intravenous contrast.  CONTRAST:  71mL OMNIPAQUE IOHEXOL 300 MG/ML SOLN, 135mL OMNIPAQUE IOHEXOL 300 MG/ML SOLN  COMPARISON:  CT chest 08/11/2013.  CT abdomen pelvis from 08/25/2013  FINDINGS: CT CHEST FINDINGS  The heart size appears normal. There is no pericardial effusion. Index right paratracheal lymph node measures 1.1 cm, image 22/ series 2. Previously 1.3 cm. Index right hilar lymph node measures 0.9 cm, image 29/ series 2. Previously 1.5 cm. The index sub- carinal lymph node measures 0.9 cm, image 30/ series 2. Previously 1.3 cm. No new or enlarging mediastinal or hilar lymph nodes. There is no axillary or supraclavicular adenopathy.  No pleural effusion identified. The right middle lobe tumor measures 1.1 x 1.6 cm, image 38/ series 2. This is compared with 2.3 x 2.8 cm previously. 4 mm nodule in the left upper lobe is identified, image 24/ series 4. Previously 0.5 cm. No new or enlarging pulmonary nodules or masses.  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 There appears to be increased in mineralization associated with the previous lytic metastasis suggesting interval response to therapy. Interval Expansile lesion  involving the anterior aspect of the left second rib measures 2.5 cm, image 22/ series 4. This is compared with 1.4 cm previously.  CT ABDOMEN AND PELVIS FINDINGS  No focal liver abnormality identified. The gallbladder appears normal. No biliary dilatation. Normal appearance of the pancreas. The spleen is on unremarkable.  The adrenal glands both appear normal. The kidneys are on unremarkable. Urinary bladder appears normal. Prostate gland and seminal vesicles are unremarkable.  There is calcified atherosclerotic disease involving the abdominal aorta. No aneurysm identified. No abdominal adenopathy noted. There is no pelvic or inguinal adenopathy.  No free fluid or fluid collections identified within the abdomen or pelvis.  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 Index lesion within the central portion of the sacrum measures 3.4 cm, image 105/series 2. Previously 3.3 cm. Expansile lesion within the left iliac wing measures 2.2 cm, image 107/series 2. Previously 2.3 cm. Right iliac wing lesion measures 2  cm, image 99/series 2. Previously 2.2 cm.  IMPRESSION: 1. Interval response to therapy. There has been decrease in size of right middle lobe tumor and associated mediastinal and hilar lymph node metastasis. 2. Increase in mineralization associated with multi focal thoracic metastasis suggesting partial healing of previous lytic metastasis. 3. No evidence for soft tissue metastasis within the abdomen or pelvis. 4. 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   Electronically Signed   By: Kerby Moors M.D.   On: 11/05/2013 09:49   Ct Abdomen Pelvis W Contrast  11/05/2013   CLINICAL DATA:  Restaging lung cancer  EXAM: CT CHEST, ABDOMEN, AND PELVIS WITH CONTRAST  TECHNIQUE: Multidetector CT imaging of the chest, abdomen and pelvis was performed following the standard protocol during bolus  administration of intravenous contrast.  CONTRAST:  64mL OMNIPAQUE IOHEXOL 300 MG/ML SOLN, 159mL OMNIPAQUE IOHEXOL 300 MG/ML SOLN  COMPARISON:  CT chest 08/11/2013.  CT abdomen pelvis from 08/25/2013  FINDINGS: CT CHEST FINDINGS  The heart size appears normal. There is no pericardial effusion. Index right paratracheal lymph node measures 1.1 cm, image 22/ series 2. Previously 1.3 cm. Index right hilar lymph node measures 0.9 cm, image 29/ series 2. Previously 1.5 cm. The index sub- carinal lymph node measures 0.9 cm, image 30/ series 2. Previously 1.3 cm. No new or enlarging mediastinal or hilar lymph nodes. There is no axillary or supraclavicular adenopathy.  No pleural effusion identified. The right middle lobe tumor measures 1.1 x 1.6 cm, image 38/ series 2. This is compared with 2.3 x 2.8 cm previously. 4 mm nodule in the left upper lobe is identified, image 24/ series 4. Previously 0.5 cm. No new or enlarging pulmonary nodules or masses.  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 There appears to be increased in mineralization associated with the previous lytic metastasis suggesting interval response to therapy. Interval Expansile lesion involving the anterior aspect of the left second rib measures 2.5 cm, image 22/ series 4. This is compared with 1.4 cm previously.  CT ABDOMEN AND PELVIS FINDINGS  No focal liver abnormality identified. The gallbladder appears normal. No biliary dilatation. Normal appearance of the pancreas. The spleen is on unremarkable.  The adrenal glands both appear normal. The kidneys are on unremarkable. Urinary bladder appears normal. Prostate gland and seminal vesicles are unremarkable.  There is calcified atherosclerotic disease involving the abdominal aorta. No aneurysm identified. No abdominal adenopathy noted. There is no pelvic or inguinal adenopathy.  No free fluid or fluid collections identified within the abdomen or pelvis.  Multi focal lytic  metastasis are again noted involving the lumbar spine and bony pelvis. Index lesion within the central portion of the sacrum measures 3.4 cm, image 105/series 2. Previously 3.3 cm. Expansile lesion within the left iliac wing measures 2.2 cm, image 107/series 2. Previously 2.3 cm. Right iliac wing lesion measures 2 cm, image 99/series 2. Previously 2.2 cm.  IMPRESSION: 1. Interval response to therapy. There has been decrease in size of right middle lobe tumor and associated mediastinal and hilar lymph node metastasis. 2. Increase in mineralization associated with multi focal thoracic metastasis suggesting partial healing of previous lytic metastasis. 3. No evidence for soft tissue metastasis within the abdomen or pelvis. 4. 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   Electronically Signed   By: Queen Slough.D.  On: 11/05/2013 09:49        Scheduled Meds: . docusate sodium  100 mg Oral BID  . dronabinol  5 mg Oral BID AC  . OxyCODONE  60 mg Oral Q12H  . pantoprazole  40 mg Oral Daily  . polyethylene glycol  17 g Oral BID  . QUEtiapine  600 mg Oral QHS  . 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 [facility] Pager [phone]  If 7PM-7AM, please contact night-coverage [email or web address] Password [email or web address] 11/06/2013, 12:22 PM    LOS: 2 days

## 2013-11-07 DIAGNOSIS — C7931 Secondary malignant neoplasm of brain: Secondary | ICD-10-CM

## 2013-11-07 DIAGNOSIS — C7949 Secondary malignant neoplasm of other parts of nervous system: Secondary | ICD-10-CM

## 2013-11-07 LAB — BASIC METABOLIC PANEL
BUN: 6 mg/dL (ref 6–23)
CALCIUM: 8.8 mg/dL (ref 8.4–10.5)
CHLORIDE: 96 meq/L (ref 96–112)
CO2: 27 meq/L (ref 19–32)
CREATININE: 0.56 mg/dL (ref 0.50–1.35)
GFR calc non Af Amer: >90 mL/min (ref >90)
Glucose, Bld: 91 mg/dL (ref 70–99)
Potassium: 3.5 mEq/L — ABNORMAL LOW (ref 3.7–5.3)
SODIUM: 137 meq/L (ref 137–147)

## 2013-11-07 LAB — CBC
HEMATOCRIT: 27.2 % — AB (ref 39.0–52.0)
Hemoglobin: 9 g/dL — ABNORMAL LOW (ref 13.0–17.0)
MCH: 29.2 pg (ref 26.0–34.0)
MCHC: 33.1 g/dL (ref 30.0–36.0)
MCV: 88.3 fL (ref 78.0–100.0)
PLATELETS: 40 10*3/uL — AB (ref 150–400)
RBC: 3.08 MIL/uL — ABNORMAL LOW (ref 4.22–5.81)
RDW: 18.7 % — AB (ref 11.5–15.5)
WBC: 8.1 10*3/uL (ref 4.0–10.5)

## 2013-11-07 MED ORDER — DRONABINOL 5 MG PO CAPS: 5.0000 mg | ORAL_CAPSULE | Freq: Two times a day (BID) | ORAL | Status: DC

## 2013-11-07 MED ORDER — HEPARIN SOD (PORK) LOCK FLUSH 100 UNIT/ML IV SOLN
500.0000 [IU] | INTRAVENOUS | Status: AC | PRN
  Administered 2013-11-07: 500 [IU]

## 2013-11-07 MED ORDER — OXYCODONE HCL 5 MG PO TABS: 10.0000 mg | ORAL_TABLET | ORAL | Status: DC | PRN

## 2013-11-07 MED ORDER — POLYETHYLENE GLYCOL 3350 17 G PO PACK: 17.0000 g | PACK | Freq: Two times a day (BID) | ORAL | Status: DC

## 2013-11-07 MED ORDER — POTASSIUM CHLORIDE CRYS ER 20 MEQ PO TBCR: 20.0000 meq | EXTENDED_RELEASE_TABLET | Freq: Every day | ORAL | Status: DC

## 2013-11-07 MED ORDER — DOCUSATE SODIUM 100 MG PO CAPS: 100.0000 mg | ORAL_CAPSULE | Freq: Two times a day (BID) | ORAL | Status: DC

## 2013-11-07 NOTE — Discharge Summary (Signed)
Physician Discharge Summary  [name] [name] QBH:[id number] DOB: 1966-03-23 DOA: 11/04/2013  PCP: [name], MD  Admit date: 11/04/2013 Discharge date: 11/07/2013  Time spent: Less than 30 minutes  Recommendations for Outpatient Follow-up:  1. Dr. Curt Bears, Oncology. Has lab work appointment on 11/08/13.  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  Filed Weights   11/04/13 1106  Weight: 94.6 kg (208 lb 8.9 oz)    History of present illness:  48 y.o. male with past medical history with recently diagnosed stage IV NSCLC, follows with Dr. Julien Nordmann of the regional [facility]. Patient had 3 cycles of systemic chemotherapy with carboplatin and Alimta, last one was on 5/19. Patient came in to the hospital because of increasing back pain, please note that patient has known metastasis to his back with pathological fracture in T5 and T6. He also had some altered mental status in the context of pain medications. In the ED found to have low potassium of 2.3, anemic with hemoglobin of 7.9 and platelets of 29. Patient admitted to the hospital for further evaluation  Hospital Course:   1. Acute on chronic back pain: Likely secondary to metastatic disease. Since admission, pain control has improved. Repeat CT chest abdomen and pelvis shows improvement. DC'ed Duragesic patch. DC'ed Neurontin which patient had stopped due to some side effects. Continue PTA Oxycontin dose. Oxycontin IR dose for breakthrough pain increased slightly and patient has tolerated well. He does not appear in pain. Has been ambulating the halls without difficulty and MS is at baseline. OP f/u with medical oncology re further Mx. 2. Severe anemia and thrombocytopenia: Secondary to chemotherapy.  Hemoglobin had dropped from 7.9 on admission to 6.1. S/p 2 units of PRBCs and improved & stable. Platelets 16 >20>21>40-transfuse if less than 10 or and develops bleeding. Oncology input appreciated. 3. Hypokalemia: Secondary to diuretics. Resolved. Magnesium 1.9. Continue low dose PO potassium as OP and follow BMP closely. 4. Stage IV non-small cell lung cancer: Repeat CT chest abdomen and pelvis shows improvement. Management per oncology. Discussed with Dr. Marin Olp on 6/1 and he has cleared for DC home. 5. Transient altered mental status: Related to pain medications. Resolved. Monitor closely. 6. History of bipolar disorder/depression: Continue Seroquel. Stable 7. Mild sinus tachycardia: asymptomatic. TSH normal. Denies CP or SOB. No features on infection/fever.   Discussed with patient's mother at bedside. Updated care and answered questions.   Consultations:  Medical Oncology.  Procedures:  None    Discharge Exam:  Complaints:  Denies complaints. Back pain improved and controlled. MS at baseline as per mother.  Filed Vitals:   11/06/13 1352 11/06/13 1400 11/06/13 2129 11/07/13 0521  BP: 129/114 108/81 121/76 115/71  Pulse: 114 103 104 106  Temp: 97.7 F (36.5 C) 97.3 F (36.3 C) 97.7 F (36.5 C) 98.3 F (36.8 C)  TempSrc: Oral Axillary Oral Oral  Resp: 20 20 20 22   Height:      Weight:      SpO2: 97% 97% 100% 100%    General exam: pleasant young male seen ambulating the halls comfortably all morning .  Respiratory system: Clear. No increased work of breathing.  Cardiovascular system: S1 & S2 heard, RRR. No JVD, murmurs, gallops, clicks. Trace bilateral ankle edema. Gastrointestinal system: Abdomen is  nondistended, soft and nontender. Normal bowel sounds heard.  Central nervous system: Alert and oriented. No focal neurological deficits.  Extremities: Symmetric 5 x 5 power.   Discharge Instructions      Discharge Instructions   Call MD for:  severe uncontrolled  pain    Complete by:  As directed      Call MD for:    Complete by:  As directed   Confusion.     Diet general    Complete by:  As directed      Increase activity slowly    Complete by:  As directed             Medication List    STOP taking these medications       benzonatate 200 MG capsule  Commonly known as:  TESSALON     diazepam 10 MG tablet  Commonly known as:  VALIUM     fentaNYL 50 MCG/HR  Commonly known as:  DURAGESIC - dosed mcg/hr     gabapentin 100 MG capsule  Commonly known as:  NEURONTIN     torsemide 10 MG tablet  Commonly known as:  DEMADEX      TAKE these medications       DEXILANT 60 MG capsule  Generic drug:  dexlansoprazole  Take 60 mg by mouth daily.     docusate sodium 100 MG capsule  Commonly known as:  COLACE  Take 1 capsule (100 mg total) by mouth 2 (two) times daily.     dronabinol 5 MG capsule  Commonly known as:  MARINOL  Take 1 capsule (5 mg total) by mouth 2 (two) times daily before lunch and supper.     folic acid 1 MG tablet  Commonly known as:  FOLVITE  Take 2.5 mg by mouth daily.     lidocaine-prilocaine cream  Commonly known as:  EMLA  Apply 1 application topically once as needed (port access (skin numbing)).     mometasone 0.1 % ointment  Commonly known as:  ELOCON  Apply 1 application topically 2 (two) times daily.     nicotine 21 mg/24hr patch  Commonly known as:  NICODERM CQ - dosed in mg/24 hours  Place 1 patch (21 mg total) onto the skin daily.     OxyCODONE HCl ER 60 MG T12a  Commonly known as:  OXYCONTIN  Take 1 tablet by mouth every 12 (twelve) hours.     oxyCODONE 5 MG immediate release tablet  Commonly known as:  Oxy IR/ROXICODONE  Take 2-4 tablets (10-20 mg total) by mouth every 4 (four) hours as needed for breakthrough pain.     polyethylene glycol packet  Commonly known as:  MIRALAX / GLYCOLAX  Take 17 g by mouth 2 (two) times daily.     potassium chloride SA 20 MEQ tablet  Commonly known as:   K-DUR,KLOR-CON  Take 1 tablet (20 mEq total) by mouth daily.     prochlorperazine 10 MG tablet  Commonly known as:  COMPAZINE  Take 10 mg by mouth every 8 (eight) hours as needed for nausea or vomiting.     QUEtiapine 300 MG tablet  Commonly known as:  SEROQUEL  Take 600 mg by mouth at bedtime.       Follow-up Information   Follow up with Eilleen Kempf., MD. (Keep up previous appointments- have lab work appt on 11/08/13 at [facility] center.)    Specialty:  Oncology   Contact information:   [address] [place]  [address] [phone]        The results of significant diagnostics from this hospitalization (including imaging, microbiology, ancillary and laboratory) are listed below for reference.    Significant Diagnostic Studies: Dg Chest 2 View  11/04/2013   CLINICAL DATA:  Chest pain  EXAM: CHEST  2 VIEW  COMPARISON:  August 26, 2013  FINDINGS: Port-A-Cath tip is at the cavoatrial junction. No pneumothorax. There is patchy atelectasis in both lower lobes. Elsewhere lungs are clear. Heart size and pulmonary vascularity are normal. No adenopathy. There is evidence of old rib trauma involving the anterior left third rib, stable.  IMPRESSION: Patchy bibasilar atelectasis.  Elsewhere lungs are clear.   Electronically Signed   By: Lowella Grip M.D.   On: 11/04/2013 08:19   Ct Chest W Contrast  11/05/2013   CLINICAL DATA:  Restaging lung cancer  EXAM: CT CHEST, ABDOMEN, AND PELVIS WITH CONTRAST  TECHNIQUE: Multidetector CT imaging of the chest, abdomen and pelvis was performed following the standard protocol during bolus administration of intravenous contrast.  CONTRAST:  31mL OMNIPAQUE IOHEXOL 300 MG/ML SOLN, 133mL OMNIPAQUE IOHEXOL 300 MG/ML SOLN  COMPARISON:  CT chest 08/11/2013.  CT abdomen pelvis from 08/25/2013  FINDINGS: CT CHEST FINDINGS  The heart size appears normal. There is no pericardial effusion. Index right paratracheal lymph node measures 1.1 cm, image 22/ series  2. Previously 1.3 cm. Index right hilar lymph node measures 0.9 cm, image 29/ series 2. Previously 1.5 cm. The index sub- carinal lymph node measures 0.9 cm, image 30/ series 2. Previously 1.3 cm. No new or enlarging mediastinal or hilar lymph nodes. There is no axillary or supraclavicular adenopathy.  No pleural effusion identified. The right middle lobe tumor measures 1.1 x 1.6 cm, image 38/ series 2. This is compared with 2.3 x 2.8 cm previously. 4 mm nodule in the left upper lobe is identified, image 24/ series 4. Previously 0.5 cm. No new or enlarging pulmonary nodules or masses.  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 There appears to be increased in mineralization associated with the previous lytic metastasis suggesting interval response to therapy. Interval Expansile lesion involving the anterior aspect of the left second rib measures 2.5 cm, image 22/ series 4. This is compared with 1.4 cm previously.  CT ABDOMEN AND PELVIS FINDINGS  No focal liver abnormality identified. The gallbladder appears normal. No biliary dilatation. Normal appearance of the pancreas. The spleen is on unremarkable.  The adrenal glands both appear normal. The kidneys are on unremarkable. Urinary bladder appears normal. Prostate gland and seminal vesicles are unremarkable.  There is calcified atherosclerotic disease involving the abdominal aorta. No aneurysm identified. No abdominal adenopathy noted. There is no pelvic or inguinal adenopathy.  No free fluid or fluid collections identified within the abdomen or pelvis.  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 Index lesion within the central portion of the sacrum measures 3.4 cm, image 105/series 2. Previously 3.3 cm. Expansile lesion within the left iliac wing measures 2.2 cm, image 107/series 2. Previously 2.3 cm. Right iliac wing lesion measures 2 cm, image 99/series 2. Previously 2.2 cm.  IMPRESSION:  1. Interval response to therapy. There has been decrease in size of right middle lobe tumor and associated mediastinal and hilar lymph node metastasis. 2. Increase in mineralization associated with multi focal thoracic metastasis suggesting partial healing of previous lytic metastasis. 3. No evidence for soft tissue metastasis within the abdomen or  pelvis. 4. 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   Electronically Signed   By: Kerby Moors M.D.   On: 11/05/2013 09:49   Ct Abdomen Pelvis W Contrast  11/05/2013   CLINICAL DATA:  Restaging lung cancer  EXAM: CT CHEST, ABDOMEN, AND PELVIS WITH CONTRAST  TECHNIQUE: Multidetector CT imaging of the chest, abdomen and pelvis was performed following the standard protocol during bolus administration of intravenous contrast.  CONTRAST:  46mL OMNIPAQUE IOHEXOL 300 MG/ML SOLN, 155mL OMNIPAQUE IOHEXOL 300 MG/ML SOLN  COMPARISON:  CT chest 08/11/2013.  CT abdomen pelvis from 08/25/2013  FINDINGS: CT CHEST FINDINGS  The heart size appears normal. There is no pericardial effusion. Index right paratracheal lymph node measures 1.1 cm, image 22/ series 2. Previously 1.3 cm. Index right hilar lymph node measures 0.9 cm, image 29/ series 2. Previously 1.5 cm. The index sub- carinal lymph node measures 0.9 cm, image 30/ series 2. Previously 1.3 cm. No new or enlarging mediastinal or hilar lymph nodes. There is no axillary or supraclavicular adenopathy.  No pleural effusion identified. The right middle lobe tumor measures 1.1 x 1.6 cm, image 38/ series 2. This is compared with 2.3 x 2.8 cm previously. 4 mm nodule in the left upper lobe is identified, image 24/ series 4. Previously 0.5 cm. No new or enlarging pulmonary nodules or masses.  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 There appears to be increased in mineralization associated with the previous lytic metastasis  suggesting interval response to therapy. Interval Expansile lesion involving the anterior aspect of the left second rib measures 2.5 cm, image 22/ series 4. This is compared with 1.4 cm previously.  CT ABDOMEN AND PELVIS FINDINGS  No focal liver abnormality identified. The gallbladder appears normal. No biliary dilatation. Normal appearance of the pancreas. The spleen is on unremarkable.  The adrenal glands both appear normal. The kidneys are on unremarkable. Urinary bladder appears normal. Prostate gland and seminal vesicles are unremarkable.  There is calcified atherosclerotic disease involving the abdominal aorta. No aneurysm identified. No abdominal adenopathy noted. There is no pelvic or inguinal adenopathy.  No free fluid or fluid collections identified within the abdomen or pelvis.  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 Index lesion within the central portion of the sacrum measures 3.4 cm, image 105/series 2. Previously 3.3 cm. Expansile lesion within the left iliac wing measures 2.2 cm, image 107/series 2. Previously 2.3 cm. Right iliac wing lesion measures 2 cm, image 99/series 2. Previously 2.2 cm.  IMPRESSION: 1. Interval response to therapy. There has been decrease in size of right middle lobe tumor and associated mediastinal and hilar lymph node metastasis. 2. Increase in mineralization associated with multi focal thoracic metastasis suggesting partial healing of previous lytic metastasis. 3. No evidence for soft tissue metastasis within the abdomen or pelvis. 4. 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   Electronically Signed   By: Kerby Moors M.D.   On: 11/05/2013 09:49    Microbiology: No results found for this or any previous visit (from the past 240 hour(s)).   Labs: Basic Metabolic Panel:  Recent Labs Lab 11/01/13 1140 11/04/13 0725 11/05/13 0412  11/06/13 0519 11/07/13 0355  NA 135* 132* 136* 139 137  K 2.7* 2.3* 3.0* 3.2* 3.5*  CL  --  83* 96 98 96  CO2 34* 35* 30 29 27   GLUCOSE 121 119* 99 111* 91  BUN 12.0 14 7 7 6   CREATININE 0.7 0.53 0.50 0.72 0.56  CALCIUM 8.7 7.4* 7.5* 8.1* 8.8  MG  --   --  1.9  --   --    Liver Function Tests:  Recent Labs Lab 11/01/13 1140 11/04/13 0725  AST 18 17  ALT 26 31  ALKPHOS 153* 157*  BILITOT 0.71 1.1  PROT 7.4 7.1  ALBUMIN 2.9* 2.9*   No results found for this basename: LIPASE, AMYLASE,  in the last 168 hours No results found for this basename: AMMONIA,  in the last 168 hours CBC:  Recent Labs Lab 11/01/13 1140 11/04/13 0725 11/05/13 0412 11/05/13 2200 11/06/13 0519 11/07/13 0355  WBC 10.2 11.3* 7.1 8.7 8.5 8.1  NEUTROABS 8.9* 9.4*  --   --   --   --   HGB 9.1* 7.9* 6.1* 9.2* 8.7* 9.0*  HCT 27.3* 22.3* 18.1* 26.5* 25.3* 27.2*  MCV 85.8 83.5 86.6 86.3 87.8 88.3  PLT 100* 29* 16* 20* 21* 40*   Cardiac Enzymes:  Recent Labs Lab 11/04/13 0811  TROPONINI <0.30   BNP: BNP (last 3 results) No results found for this basename: PROBNP,  in the last 8760 hours CBG: No results found for this basename: GLUCAP,  in the last 168 hours    Signed:  Modena Jansky, MD, FACP, [facility]. [facility] Pager [phone]  If 7PM-7AM, please contact night-coverage [email or web address] Password [facility] 11/07/2013, 10:49 AM

## 2013-11-08 ENCOUNTER — Ambulatory Visit (HOSPITAL_BASED_OUTPATIENT_CLINIC_OR_DEPARTMENT_OTHER): Payer: Medicare Other

## 2013-11-08 ENCOUNTER — Telehealth: Payer: Self-pay | Admitting: Medical Oncology

## 2013-11-08 ENCOUNTER — Other Ambulatory Visit (HOSPITAL_BASED_OUTPATIENT_CLINIC_OR_DEPARTMENT_OTHER): Payer: Medicare Other

## 2013-11-08 VITALS — BP 103/58 | HR 119 | Temp 97.4°F

## 2013-11-08 DIAGNOSIS — Z452 Encounter for adjustment and management of vascular access device: Secondary | ICD-10-CM

## 2013-11-08 DIAGNOSIS — C3490 Malignant neoplasm of unspecified part of unspecified bronchus or lung: Secondary | ICD-10-CM

## 2013-11-08 DIAGNOSIS — C342 Malignant neoplasm of middle lobe, bronchus or lung: Secondary | ICD-10-CM

## 2013-11-08 DIAGNOSIS — C7951 Secondary malignant neoplasm of bone: Secondary | ICD-10-CM

## 2013-11-08 DIAGNOSIS — C7952 Secondary malignant neoplasm of bone marrow: Secondary | ICD-10-CM

## 2013-11-08 DIAGNOSIS — Z95828 Presence of other vascular implants and grafts: Secondary | ICD-10-CM

## 2013-11-08 LAB — COMPREHENSIVE METABOLIC PANEL (CC13)
ALBUMIN: 2.8 g/dL — AB (ref 3.5–5.0)
ALT: 27 U/L (ref 0–55)
ANION GAP: 14 meq/L — AB (ref 3–11)
AST: 13 U/L (ref 5–34)
Alkaline Phosphatase: 176 U/L — ABNORMAL HIGH (ref 40–150)
BUN: 4.2 mg/dL — AB (ref 7.0–26.0)
CALCIUM: 8.3 mg/dL — AB (ref 8.4–10.4)
CO2: 21 meq/L — AB (ref 22–29)
CREATININE: 0.6 mg/dL — AB (ref 0.7–1.3)
Chloride: 104 mEq/L (ref 98–109)
GLUCOSE: 99 mg/dL (ref 70–140)
POTASSIUM: 3.9 meq/L (ref 3.5–5.1)
Sodium: 139 mEq/L (ref 136–145)
Total Bilirubin: 0.64 mg/dL (ref 0.20–1.20)
Total Protein: 6.8 g/dL (ref 6.4–8.3)

## 2013-11-08 LAB — CBC WITH DIFFERENTIAL/PLATELET
BASO%: 1.5 % (ref 0.0–2.0)
BASOS ABS: 0.1 10*3/uL (ref 0.0–0.1)
EOS ABS: 1.4 10*3/uL — AB (ref 0.0–0.5)
EOS%: 19.9 % — ABNORMAL HIGH (ref 0.0–7.0)
HEMATOCRIT: 27.1 % — AB (ref 38.4–49.9)
HGB: 9 g/dL — ABNORMAL LOW (ref 13.0–17.1)
LYMPH#: 0.7 10*3/uL — AB (ref 0.9–3.3)
LYMPH%: 10.2 % — ABNORMAL LOW (ref 14.0–49.0)
MCH: 29.8 pg (ref 27.2–33.4)
MCHC: 33.3 g/dL (ref 32.0–36.0)
MCV: 89.4 fL (ref 79.3–98.0)
MONO#: 0.8 10*3/uL (ref 0.1–0.9)
MONO%: 11.1 % (ref 0.0–14.0)
NEUT%: 57.3 % (ref 39.0–75.0)
NEUTROS ABS: 3.9 10*3/uL (ref 1.5–6.5)
Platelets: 94 10*3/uL — ABNORMAL LOW (ref 140–400)
RBC: 3.03 10*6/uL — ABNORMAL LOW (ref 4.20–5.82)
RDW: 18.9 % — AB (ref 11.0–14.6)
WBC: 6.9 10*3/uL (ref 4.0–10.3)

## 2013-11-08 LAB — TECHNOLOGIST REVIEW

## 2013-11-08 MED ORDER — HEPARIN SOD (PORK) LOCK FLUSH 100 UNIT/ML IV SOLN
500.0000 [IU] | Freq: Once | INTRAVENOUS | Status: AC
  Administered 2013-11-08: 500 [IU] via INTRAVENOUS
  Filled 2013-11-08: qty 5

## 2013-11-08 MED ORDER — SODIUM CHLORIDE 0.9 % IJ SOLN
10.0000 mL | INTRAMUSCULAR | Status: DC | PRN
  Administered 2013-11-08: 10 mL via INTRAVENOUS
  Filled 2013-11-08: qty 10

## 2013-11-08 NOTE — Telephone Encounter (Signed)
Mother requests hospital bed. Order and notes sent to [facility].Pt father notified of plan.

## 2013-11-09 ENCOUNTER — Other Ambulatory Visit: Payer: Self-pay | Admitting: Medical Oncology

## 2013-11-10 ENCOUNTER — Telehealth: Payer: Self-pay | Admitting: Medical Oncology

## 2013-11-10 NOTE — Telephone Encounter (Signed)
I called pt mother and told her pt did not qualify for hospital bed now since he is ambulatory.

## 2013-11-11 ENCOUNTER — Other Ambulatory Visit: Payer: Self-pay | Admitting: Medical Oncology

## 2013-11-11 ENCOUNTER — Ambulatory Visit (HOSPITAL_COMMUNITY): Payer: Medicare Other

## 2013-11-11 ENCOUNTER — Telehealth: Payer: Self-pay | Admitting: *Deleted

## 2013-11-11 ENCOUNTER — Telehealth: Payer: Self-pay | Admitting: Medical Oncology

## 2013-11-11 NOTE — Telephone Encounter (Signed)
His mother was asking if he needs PET scan ordered by Dr Pennie Banter a while ago.  Note sent to Dr Julien Nordmann.  Pts mother  reported that pt is sleeping in a recliner/lift chair because he  cannot  reposition himself in his bed to alleviate his pain. She reports that she hears him hollering at night in pain because he cannot get himself repositioned .  [facility].

## 2013-11-11 NOTE — Progress Notes (Signed)
Cancelled Ct scans

## 2013-11-11 NOTE — Telephone Encounter (Signed)
I faxed information to [facility] for hospital bed

## 2013-11-11 NOTE — Telephone Encounter (Signed)
Received call from [facility]ry/Radiology stating that pt was there for CT scan but pt had this done as inpt 11/05/13 & wants to know if this needs to be repeated.  Discussed with Dr Julien Nordmann & agreed pt doesn't need to repeat.

## 2013-11-11 NOTE — Telephone Encounter (Signed)
erroneous

## 2013-11-15 ENCOUNTER — Encounter: Payer: Self-pay | Admitting: Internal Medicine

## 2013-11-15 ENCOUNTER — Other Ambulatory Visit: Payer: Self-pay

## 2013-11-15 ENCOUNTER — Ambulatory Visit (HOSPITAL_BASED_OUTPATIENT_CLINIC_OR_DEPARTMENT_OTHER): Payer: Medicare Other | Admitting: Internal Medicine

## 2013-11-15 ENCOUNTER — Telehealth: Payer: Self-pay | Admitting: Internal Medicine

## 2013-11-15 ENCOUNTER — Ambulatory Visit (HOSPITAL_BASED_OUTPATIENT_CLINIC_OR_DEPARTMENT_OTHER): Payer: Medicare Other

## 2013-11-15 ENCOUNTER — Other Ambulatory Visit (HOSPITAL_BASED_OUTPATIENT_CLINIC_OR_DEPARTMENT_OTHER): Payer: Medicare Other

## 2013-11-15 ENCOUNTER — Ambulatory Visit: Payer: Medicare Other

## 2013-11-15 ENCOUNTER — Telehealth: Payer: Self-pay | Admitting: *Deleted

## 2013-11-15 VITALS — BP 101/67 | HR 126 | Temp 99.2°F | Resp 17 | Ht 71.0 in | Wt 213.9 lb

## 2013-11-15 DIAGNOSIS — R52 Pain, unspecified: Secondary | ICD-10-CM

## 2013-11-15 DIAGNOSIS — C7951 Secondary malignant neoplasm of bone: Secondary | ICD-10-CM

## 2013-11-15 DIAGNOSIS — C3490 Malignant neoplasm of unspecified part of unspecified bronchus or lung: Secondary | ICD-10-CM

## 2013-11-15 DIAGNOSIS — Z5111 Encounter for antineoplastic chemotherapy: Secondary | ICD-10-CM

## 2013-11-15 DIAGNOSIS — C7952 Secondary malignant neoplasm of bone marrow: Secondary | ICD-10-CM

## 2013-11-15 DIAGNOSIS — C342 Malignant neoplasm of middle lobe, bronchus or lung: Secondary | ICD-10-CM

## 2013-11-15 DIAGNOSIS — R599 Enlarged lymph nodes, unspecified: Secondary | ICD-10-CM

## 2013-11-15 DIAGNOSIS — Z95828 Presence of other vascular implants and grafts: Secondary | ICD-10-CM

## 2013-11-15 LAB — CBC WITH DIFFERENTIAL/PLATELET
BASO%: 1.5 % (ref 0.0–2.0)
BASOS ABS: 0.2 10*3/uL — AB (ref 0.0–0.1)
EOS ABS: 1 10*3/uL — AB (ref 0.0–0.5)
EOS%: 10.6 % — ABNORMAL HIGH (ref 0.0–7.0)
HCT: 27.3 % — ABNORMAL LOW (ref 38.4–49.9)
HGB: 8.6 g/dL — ABNORMAL LOW (ref 13.0–17.1)
LYMPH%: 8.5 % — AB (ref 14.0–49.0)
MCH: 28.6 pg (ref 27.2–33.4)
MCHC: 31.5 g/dL — ABNORMAL LOW (ref 32.0–36.0)
MCV: 90.7 fL (ref 79.3–98.0)
MONO#: 1.5 10*3/uL — AB (ref 0.1–0.9)
MONO%: 15.5 % — AB (ref 0.0–14.0)
NEUT%: 63.9 % (ref 39.0–75.0)
NEUTROS ABS: 6.2 10*3/uL (ref 1.5–6.5)
PLATELETS: 303 10*3/uL (ref 140–400)
RBC: 3.01 10*6/uL — ABNORMAL LOW (ref 4.20–5.82)
RDW: 20.3 % — ABNORMAL HIGH (ref 11.0–14.6)
WBC: 9.8 10*3/uL (ref 4.0–10.3)
lymph#: 0.8 10*3/uL — ABNORMAL LOW (ref 0.9–3.3)
nRBC: 2 % — ABNORMAL HIGH (ref 0–0)

## 2013-11-15 LAB — TECHNOLOGIST REVIEW

## 2013-11-15 LAB — COMPREHENSIVE METABOLIC PANEL (CC13)
ALT: 14 U/L (ref 0–55)
ANION GAP: 8 meq/L (ref 3–11)
AST: 11 U/L (ref 5–34)
Albumin: 2.3 g/dL — ABNORMAL LOW (ref 3.5–5.0)
Alkaline Phosphatase: 178 U/L — ABNORMAL HIGH (ref 40–150)
BILIRUBIN TOTAL: 0.5 mg/dL (ref 0.20–1.20)
BUN: 3.1 mg/dL — ABNORMAL LOW (ref 7.0–26.0)
CALCIUM: 7.1 mg/dL — AB (ref 8.4–10.4)
CHLORIDE: 104 meq/L (ref 98–109)
CO2: 26 meq/L (ref 22–29)
Creatinine: 0.6 mg/dL — ABNORMAL LOW (ref 0.7–1.3)
GLUCOSE: 97 mg/dL (ref 70–140)
Potassium: 3.9 mEq/L (ref 3.5–5.1)
Sodium: 139 mEq/L (ref 136–145)
Total Protein: 6.5 g/dL (ref 6.4–8.3)

## 2013-11-15 MED ORDER — DEXAMETHASONE SODIUM PHOSPHATE 20 MG/5ML IJ SOLN
20.0000 mg | Freq: Once | INTRAMUSCULAR | Status: AC
  Administered 2013-11-15: 20 mg via INTRAVENOUS

## 2013-11-15 MED ORDER — ONDANSETRON 16 MG/50ML IVPB (CHCC)
INTRAVENOUS | Status: AC
  Filled 2013-11-15: qty 16

## 2013-11-15 MED ORDER — DEXAMETHASONE SODIUM PHOSPHATE 20 MG/5ML IJ SOLN
INTRAMUSCULAR | Status: AC
  Filled 2013-11-15: qty 5

## 2013-11-15 MED ORDER — SODIUM CHLORIDE 0.9 % IV SOLN
750.0000 mg | Freq: Once | INTRAVENOUS | Status: AC
  Administered 2013-11-15: 750 mg via INTRAVENOUS
  Filled 2013-11-15: qty 75

## 2013-11-15 MED ORDER — PEMETREXED DISODIUM CHEMO INJECTION 500 MG
500.0000 mg/m2 | Freq: Once | INTRAVENOUS | Status: AC
  Administered 2013-11-15: 1050 mg via INTRAVENOUS
  Filled 2013-11-15: qty 42

## 2013-11-15 MED ORDER — HEPARIN SOD (PORK) LOCK FLUSH 100 UNIT/ML IV SOLN
500.0000 [IU] | Freq: Once | INTRAVENOUS | Status: AC | PRN
  Administered 2013-11-15: 500 [IU]
  Filled 2013-11-15: qty 5

## 2013-11-15 MED ORDER — HEPARIN SOD (PORK) LOCK FLUSH 100 UNIT/ML IV SOLN
500.0000 [IU] | Freq: Once | INTRAVENOUS | Status: AC
  Administered 2013-11-15: 500 [IU] via INTRAVENOUS
  Filled 2013-11-15: qty 5

## 2013-11-15 MED ORDER — SODIUM CHLORIDE 0.9 % IJ SOLN
10.0000 mL | INTRAMUSCULAR | Status: DC | PRN
  Administered 2013-11-15: 10 mL
  Filled 2013-11-15: qty 10

## 2013-11-15 MED ORDER — ONDANSETRON 16 MG/50ML IVPB (CHCC)
16.0000 mg | Freq: Once | INTRAVENOUS | Status: AC
  Administered 2013-11-15: 16 mg via INTRAVENOUS

## 2013-11-15 MED ORDER — SODIUM CHLORIDE 0.9 % IV SOLN
Freq: Once | INTRAVENOUS | Status: AC
  Administered 2013-11-15: 12:00:00 via INTRAVENOUS

## 2013-11-15 MED ORDER — SODIUM CHLORIDE 0.9 % IJ SOLN
10.0000 mL | INTRAMUSCULAR | Status: DC | PRN
  Administered 2013-11-15: 10 mL via INTRAVENOUS
  Filled 2013-11-15: qty 10

## 2013-11-15 NOTE — Telephone Encounter (Signed)
Gave pt appt for lab,chemo and MD for June and july , emailed [facility] regarding chemo

## 2013-11-15 NOTE — Patient Instructions (Signed)

## 2013-11-15 NOTE — Patient Instructions (Signed)
Smoking Cessation, Tips for Success If you are ready to quit smoking, congratulations! You have chosen to help yourself be healthier. Cigarettes bring nicotine, tar, carbon monoxide, and other irritants into your body. Your lungs, heart, and blood vessels will be able to work better without these poisons. There are many different ways to quit smoking. Nicotine gum, nicotine patches, a nicotine inhaler, or nicotine nasal spray can help with physical craving. Hypnosis, support groups, and medicines help break the habit of smoking. WHAT THINGS CAN I DO TO MAKE QUITTING EASIER?  Here are some tips to help you quit for good:  Pick a date when you will quit smoking completely. Tell all of your friends and family about your plan to quit on that date.  Do not try to slowly cut down on the number of cigarettes you are smoking. Pick a quit date and quit smoking completely starting on that day.  Throw away all cigarettes.   Clean and remove all ashtrays from your home, work, and car.   On a card, write down your reasons for quitting. Carry the card with you and read it when you get the urge to smoke.   Cleanse your body of nicotine. Drink enough water and fluids to keep your urine clear or pale yellow. Do this after quitting to flush the nicotine from your body.   Learn to predict your moods. Do not let a bad situation be your excuse to have a cigarette. Some situations in your life might tempt you into wanting a cigarette.   Never have "just one" cigarette. It leads to wanting another and another. Remind yourself of your decision to quit.   Change habits associated with smoking. If you smoked while driving or when feeling stressed, try other activities to replace smoking. Stand up when drinking your coffee. Brush your teeth after eating. Sit in a different chair when you read the paper. Avoid alcohol while trying to quit, and try to drink fewer caffeinated beverages. Alcohol and caffeine may urge  you to smoke.   Avoid foods and drinks that can trigger a desire to smoke, such as sugary or spicy foods and alcohol.   Ask people who smoke not to smoke around you.   Have something planned to do right after eating or having a cup of coffee. For example, plan to take a walk or exercise.   Try a relaxation exercise to calm you down and decrease your stress. Remember, you may be tense and nervous for the first 2 weeks after you quit, but this will pass.   Find new activities to keep your hands busy. Play with a pen, coin, or rubber band. Doodle or draw things on paper.   Brush your teeth right after eating. This will help cut down on the craving for the taste of tobacco after meals. You can also try mouthwash.   Use oral substitutes in place of cigarettes. Try using lemon drops, carrots, cinnamon sticks, or chewing gum. Keep them handy so they are available when you have the urge to smoke.   When you have the urge to smoke, try deep breathing.   Designate your home as a nonsmoking area.   If you are a heavy smoker, ask your health care provider about a prescription for nicotine chewing gum. It can ease your withdrawal from nicotine.   Reward yourself. Set aside the cigarette money you save and buy yourself something nice.   Look for support from others. Join a support group or   smoking cessation program. Ask someone at home or at work to help you with your plan to quit smoking.   Always ask yourself, "Do I need this cigarette or is this just a reflex?" Tell yourself, "Today, I choose not to smoke," or "I do not want to smoke." You are reminding yourself of your decision to quit.  Do not replace cigarette smoking with electronic cigarettes (commonly called e-cigarettes). The safety of e-cigarettes is unknown, and some may contain harmful chemicals.  If you relapse, do not give up! Plan ahead and think about what you will do the next time you get the urge to smoke.  HOW WILL  I FEEL WHEN I QUIT SMOKING? You may have symptoms of withdrawal because your body is used to nicotine (the addictive substance in cigarettes). You may crave cigarettes, be irritable, feel very hungry, cough often, get headaches, or have difficulty concentrating. The withdrawal symptoms are only temporary. They are strongest when you first quit but will go away within 10 14 days. When withdrawal symptoms occur, stay in control. Think about your reasons for quitting. Remind yourself that these are signs that your body is healing and getting used to being without cigarettes. Remember that withdrawal symptoms are easier to treat than the major diseases that smoking can cause.  Even after the withdrawal is over, expect periodic urges to smoke. However, these cravings are generally short lived and will go away whether you smoke or not. Do not smoke!  WHAT RESOURCES ARE AVAILABLE TO HELP ME QUIT SMOKING? Your health care provider can direct you to community resources or hospitals for support, which may include:  Group support.  Education.  Hypnosis.  Therapy. Document Released: 02/22/2004 Document Revised: 03/16/2013 Document Reviewed: 11/11/2012 [employer] Patient Information 2014 [employer]  

## 2013-11-15 NOTE — Telephone Encounter (Signed)
Called pt and left message regarding chemo lab and MD , mailed appt

## 2013-11-15 NOTE — Telephone Encounter (Signed)
Called pt regarding and mailed appt

## 2013-11-15 NOTE — Telephone Encounter (Signed)
I have adjusted 6/30 appt and scheduled 7/21
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
## 2013-11-15 NOTE — Patient Instructions (Signed)
[facility] Discharge Instructions for Patients Receiving Chemotherapy  Today you received the following chemotherapy agents Alimta and Carboplatin.  To help prevent nausea and vomiting after your treatment, we encourage you to take your nausea medication.   If you develop nausea and vomiting that is not controlled by your nausea medication, call the clinic.   BELOW ARE SYMPTOMS THAT SHOULD BE REPORTED IMMEDIATELY:  *FEVER GREATER THAN 100.5 F  *CHILLS WITH OR WITHOUT FEVER  NAUSEA AND VOMITING THAT IS NOT CONTROLLED WITH YOUR NAUSEA MEDICATION  *UNUSUAL SHORTNESS OF BREATH  *UNUSUAL BRUISING OR BLEEDING  TENDERNESS IN MOUTH AND THROAT WITH OR WITHOUT PRESENCE OF ULCERS  *URINARY PROBLEMS  *BOWEL PROBLEMS  UNUSUAL RASH Items with * indicate a potential emergency and should be followed up as soon as possible.  Feel free to call the clinic you have any questions or concerns. The clinic phone number is [phone].

## 2013-11-16 ENCOUNTER — Other Ambulatory Visit: Payer: Self-pay | Admitting: Internal Medicine

## 2013-11-16 DIAGNOSIS — C7951 Secondary malignant neoplasm of bone: Secondary | ICD-10-CM

## 2013-11-16 DIAGNOSIS — E876 Hypokalemia: Secondary | ICD-10-CM

## 2013-11-16 DIAGNOSIS — C342 Malignant neoplasm of middle lobe, bronchus or lung: Secondary | ICD-10-CM

## 2013-11-16 DIAGNOSIS — C7952 Secondary malignant neoplasm of bone marrow: Principal | ICD-10-CM

## 2013-11-22 ENCOUNTER — Ambulatory Visit: Payer: Medicare Other

## 2013-11-22 ENCOUNTER — Other Ambulatory Visit (HOSPITAL_BASED_OUTPATIENT_CLINIC_OR_DEPARTMENT_OTHER): Payer: Medicare Other

## 2013-11-22 VITALS — BP 108/66 | HR 130 | Temp 97.1°F

## 2013-11-22 DIAGNOSIS — C342 Malignant neoplasm of middle lobe, bronchus or lung: Secondary | ICD-10-CM

## 2013-11-22 DIAGNOSIS — C7951 Secondary malignant neoplasm of bone: Secondary | ICD-10-CM

## 2013-11-22 DIAGNOSIS — Z95828 Presence of other vascular implants and grafts: Secondary | ICD-10-CM

## 2013-11-22 DIAGNOSIS — C3490 Malignant neoplasm of unspecified part of unspecified bronchus or lung: Secondary | ICD-10-CM

## 2013-11-22 DIAGNOSIS — C7952 Secondary malignant neoplasm of bone marrow: Secondary | ICD-10-CM

## 2013-11-22 LAB — CBC WITH DIFFERENTIAL/PLATELET
BASO%: 0.8 % (ref 0.0–2.0)
BASOS ABS: 0.1 10*3/uL (ref 0.0–0.1)
EOS ABS: 0.5 10*3/uL (ref 0.0–0.5)
EOS%: 7.7 % — ABNORMAL HIGH (ref 0.0–7.0)
HEMATOCRIT: 26 % — AB (ref 38.4–49.9)
HEMOGLOBIN: 8.4 g/dL — AB (ref 13.0–17.1)
LYMPH#: 0.7 10*3/uL — AB (ref 0.9–3.3)
LYMPH%: 10 % — ABNORMAL LOW (ref 14.0–49.0)
MCH: 28.4 pg (ref 27.2–33.4)
MCHC: 32.3 g/dL (ref 32.0–36.0)
MCV: 87.8 fL (ref 79.3–98.0)
MONO#: 0.4 10*3/uL (ref 0.1–0.9)
MONO%: 5.7 % (ref 0.0–14.0)
NEUT%: 75.8 % — AB (ref 39.0–75.0)
NEUTROS ABS: 4.9 10*3/uL (ref 1.5–6.5)
Platelets: 108 10*3/uL — ABNORMAL LOW (ref 140–400)
RBC: 2.96 10*6/uL — ABNORMAL LOW (ref 4.20–5.82)
RDW: 18.4 % — ABNORMAL HIGH (ref 11.0–14.6)
WBC: 6.5 10*3/uL (ref 4.0–10.3)

## 2013-11-22 LAB — COMPREHENSIVE METABOLIC PANEL (CC13)
ALBUMIN: 2.8 g/dL — AB (ref 3.5–5.0)
ALT: 17 U/L (ref 0–55)
AST: 13 U/L (ref 5–34)
Alkaline Phosphatase: 169 U/L — ABNORMAL HIGH (ref 40–150)
Anion Gap: 12 mEq/L — ABNORMAL HIGH (ref 3–11)
BUN: 6 mg/dL — AB (ref 7.0–26.0)
CALCIUM: 7.8 mg/dL — AB (ref 8.4–10.4)
CHLORIDE: 100 meq/L (ref 98–109)
CO2: 25 mEq/L (ref 22–29)
Creatinine: 0.6 mg/dL — ABNORMAL LOW (ref 0.7–1.3)
GLUCOSE: 106 mg/dL (ref 70–140)
POTASSIUM: 3.4 meq/L — AB (ref 3.5–5.1)
SODIUM: 137 meq/L (ref 136–145)
Total Bilirubin: 0.44 mg/dL (ref 0.20–1.20)
Total Protein: 6.9 g/dL (ref 6.4–8.3)

## 2013-11-22 MED ORDER — SODIUM CHLORIDE 0.9 % IJ SOLN
10.0000 mL | INTRAMUSCULAR | Status: DC | PRN
  Administered 2013-11-22: 10 mL via INTRAVENOUS
  Filled 2013-11-22: qty 10

## 2013-11-22 MED ORDER — HEPARIN SOD (PORK) LOCK FLUSH 100 UNIT/ML IV SOLN
500.0000 [IU] | Freq: Once | INTRAVENOUS | Status: AC
  Administered 2013-11-22: 500 [IU] via INTRAVENOUS
  Filled 2013-11-22: qty 5

## 2013-11-22 NOTE — Patient Instructions (Signed)

## 2013-11-25 ENCOUNTER — Other Ambulatory Visit: Payer: Self-pay | Admitting: Medical Oncology

## 2013-11-25 DIAGNOSIS — C342 Malignant neoplasm of middle lobe, bronchus or lung: Secondary | ICD-10-CM

## 2013-11-25 MED ORDER — OXYCODONE HCL 10 MG PO TABS: 10.0000 mg | ORAL_TABLET | ORAL | Status: DC | PRN

## 2013-11-25 NOTE — Telephone Encounter (Signed)
rx locked in injection room for pick up

## 2013-11-28 ENCOUNTER — Encounter: Payer: Self-pay | Admitting: Internal Medicine

## 2013-11-28 ENCOUNTER — Ambulatory Visit (INDEPENDENT_AMBULATORY_CARE_PROVIDER_SITE_OTHER): Payer: Medicare Other | Admitting: Internal Medicine

## 2013-11-28 VITALS — BP 90/70 | HR 120

## 2013-11-28 DIAGNOSIS — I951 Orthostatic hypotension: Secondary | ICD-10-CM

## 2013-11-28 DIAGNOSIS — R209 Unspecified disturbances of skin sensation: Secondary | ICD-10-CM

## 2013-11-28 DIAGNOSIS — L8991 Pressure ulcer of unspecified site, stage 1: Secondary | ICD-10-CM

## 2013-11-28 DIAGNOSIS — R202 Paresthesia of skin: Secondary | ICD-10-CM

## 2013-11-28 DIAGNOSIS — L89311 Pressure ulcer of right buttock, stage 1: Secondary | ICD-10-CM

## 2013-11-28 DIAGNOSIS — L89309 Pressure ulcer of unspecified buttock, unspecified stage: Secondary | ICD-10-CM

## 2013-11-28 MED ORDER — PREGABALIN 50 MG PO CAPS: 50.0000 mg | ORAL_CAPSULE | Freq: Three times a day (TID) | ORAL | Status: DC

## 2013-11-28 NOTE — Progress Notes (Signed)
Pre visit review using our clinic review tool, if applicable. No additional management support is needed unless otherwise documented below in the visit note. 

## 2013-11-28 NOTE — Patient Instructions (Addendum)
Perform isometric exercise of calves  ( while seated go up on toes to count of 5 & then onto heels for 5 count). Repeat  4- 5 times prior to standing if you've been seated or supine for any significant period of time as BP drops with such positions.   Dip gauze in  sterile saline and applied to the wound twice a day. Cover the wound with Telfa , non stick dressing  without any antibiotic ointment. The saline can be purchased at the drugstore or you can make your own .Boil cup of salt in a gallon of water. Store mixture  in a clean container.Report Warning  signs as discussed (red streaks, pus, fever, increasing pain).   Use a rubber doughnut when sitting for prolonged period time to keep pressure on ulcer & allow healing.

## 2013-11-28 NOTE — Progress Notes (Signed)
   Subjective:    Patient ID: [name] [name], male    DOB: July 18, 1965, 48 y.o.   MRN: [id number]  HPI   He describes tingling and numbness in his upper and lower extremities over the last 10 days. It is up to a level VII on 10 scale as far as severity. It is constant in nature. This began after his most recent course of chemotherapy. Gabapentin has been of no benefit for the numbness and tingling. It is worse on the left side than the right but is bilateral as noted.  He also has noted a sore on the right buttock over the past month which is tender when he sits on it. He does have a rubber donut which he is not using.  He's been applying antibiotic cream to the sore.  Since his last  chemotherapy he has been profoundly weak with a sensation of presyncope. He does have lab draw schedule 6/23.   Blood pressure has been low as noted; he is not on any hypertensive medication.      Review of Systems  He does have sweats without associated fever or chills.  There's been no purulence from the buttock wound.  He denies a loss of control his bladder or bowels.       Objective:   Physical Exam  He appears chronically ill but adequately nourished ;he sits in a wheelchair with minimal motor activity.  Skin pallor suggested  but the conjunctiva reveal normal coloration. Some edema of lower lids.  There is no conjunctivitis or scleral icterus.  He has no lymphadenopathy about the neck or axilla  Exhibits a resting tachycardia with flow murmur  Chest reveals decreased breath sounds; there mild rales at the.  Abdomen is protuberant and nontender  He has no significant pedal edema.  Strength and opposition is surprisingly good in the upper and lower extremities  Deep tendon reflexes were also normal.  There is decrease sensation to light touch over the left upper extremity  He has a 5 x 10 mm shallow ulcer over the right buttock. There is no circumferential cellulitis. No purulent  secretions are present.         Assessment & Plan:  #1 numbness and tingling in upper and lower extremities, possible adverse effect of chemotherapy  #2 postural hypotension; complete labs pending 6/23  #3 buttock wound without evidence of cellulitis or purulence  Seen and recommendations and orders.

## 2013-11-29 ENCOUNTER — Ambulatory Visit (HOSPITAL_BASED_OUTPATIENT_CLINIC_OR_DEPARTMENT_OTHER): Payer: Medicare Other

## 2013-11-29 ENCOUNTER — Ambulatory Visit (HOSPITAL_COMMUNITY)
Admission: RE | Admit: 2013-11-29 | Discharge: 2013-11-29 | Disposition: A | Discharge status: Home / Self Care | Payer: Medicare Other | Source: Ambulatory Visit | Attending: Internal Medicine | Admitting: Internal Medicine

## 2013-11-29 ENCOUNTER — Other Ambulatory Visit (HOSPITAL_BASED_OUTPATIENT_CLINIC_OR_DEPARTMENT_OTHER): Payer: Medicare Other

## 2013-11-29 ENCOUNTER — Other Ambulatory Visit: Payer: Self-pay | Admitting: *Deleted

## 2013-11-29 ENCOUNTER — Encounter: Payer: Self-pay | Admitting: *Deleted

## 2013-11-29 ENCOUNTER — Telehealth: Payer: Self-pay | Admitting: Nurse Practitioner

## 2013-11-29 DIAGNOSIS — T451X5A Adverse effect of antineoplastic and immunosuppressive drugs, initial encounter: Principal | ICD-10-CM

## 2013-11-29 DIAGNOSIS — D61810 Antineoplastic chemotherapy induced pancytopenia: Secondary | ICD-10-CM

## 2013-11-29 DIAGNOSIS — C342 Malignant neoplasm of middle lobe, bronchus or lung: Secondary | ICD-10-CM

## 2013-11-29 DIAGNOSIS — Z95828 Presence of other vascular implants and grafts: Secondary | ICD-10-CM

## 2013-11-29 DIAGNOSIS — Z452 Encounter for adjustment and management of vascular access device: Secondary | ICD-10-CM

## 2013-11-29 DIAGNOSIS — C3490 Malignant neoplasm of unspecified part of unspecified bronchus or lung: Secondary | ICD-10-CM

## 2013-11-29 LAB — CBC WITH DIFFERENTIAL/PLATELET
BASO%: 0.8 % (ref 0.0–2.0)
Basophils Absolute: 0.1 10*3/uL (ref 0.0–0.1)
EOS%: 13.1 % — ABNORMAL HIGH (ref 0.0–7.0)
Eosinophils Absolute: 1.2 10*3/uL — ABNORMAL HIGH (ref 0.0–0.5)
HCT: 22.3 % — ABNORMAL LOW (ref 38.4–49.9)
HGB: 7.2 g/dL — ABNORMAL LOW (ref 13.0–17.1)
LYMPH%: 7.1 % — ABNORMAL LOW (ref 14.0–49.0)
MCH: 29.1 pg (ref 27.2–33.4)
MCHC: 32.5 g/dL (ref 32.0–36.0)
MCV: 89.4 fL (ref 79.3–98.0)
MONO#: 0.8 10*3/uL (ref 0.1–0.9)
MONO%: 8.6 % (ref 0.0–14.0)
NEUT#: 6.4 10*3/uL (ref 1.5–6.5)
NEUT%: 70.4 % (ref 39.0–75.0)
Platelets: 81 10*3/uL — ABNORMAL LOW (ref 140–400)
RBC: 2.49 10*6/uL — AB (ref 4.20–5.82)
RDW: 18.9 % — AB (ref 11.0–14.6)
WBC: 9.1 10*3/uL (ref 4.0–10.3)
lymph#: 0.6 10*3/uL — ABNORMAL LOW (ref 0.9–3.3)

## 2013-11-29 LAB — COMPREHENSIVE METABOLIC PANEL (CC13)
ALK PHOS: 176 U/L — AB (ref 40–150)
ALT: 15 U/L (ref 0–55)
ANION GAP: 11 meq/L (ref 3–11)
AST: 13 U/L (ref 5–34)
Albumin: 2.6 g/dL — ABNORMAL LOW (ref 3.5–5.0)
BUN: 3.9 mg/dL — ABNORMAL LOW (ref 7.0–26.0)
CO2: 27 meq/L (ref 22–29)
Calcium: 5.7 mg/dL — CL (ref 8.4–10.4)
Chloride: 99 mEq/L (ref 98–109)
Creatinine: 0.7 mg/dL (ref 0.7–1.3)
Glucose: 107 mg/dl (ref 70–140)
Potassium: 3.6 mEq/L (ref 3.5–5.1)
SODIUM: 137 meq/L (ref 136–145)
TOTAL PROTEIN: 6.8 g/dL (ref 6.4–8.3)
Total Bilirubin: 0.58 mg/dL (ref 0.20–1.20)

## 2013-11-29 LAB — TECHNOLOGIST REVIEW

## 2013-11-29 MED ORDER — SODIUM CHLORIDE 0.9 % IJ SOLN
10.0000 mL | INTRAMUSCULAR | Status: DC | PRN
  Administered 2013-11-29: 10 mL via INTRAVENOUS
  Filled 2013-11-29: qty 10

## 2013-11-29 MED ORDER — OXYCODONE HCL ER 60 MG PO T12A: 1.0000 | EXTENDED_RELEASE_TABLET | Freq: Two times a day (BID) | ORAL | Status: DC

## 2013-11-29 MED ORDER — HEPARIN SOD (PORK) LOCK FLUSH 100 UNIT/ML IV SOLN
500.0000 [IU] | Freq: Once | INTRAVENOUS | Status: AC
  Administered 2013-11-29: 500 [IU] via INTRAVENOUS
  Filled 2013-11-29: qty 5

## 2013-11-29 NOTE — Telephone Encounter (Signed)
RN called pt's home and spoke to [name] [name], pt's mother. Informed her of low calcium on labs today and instructed her to begin pt on oral OTC calcium supplement 600 mg twice daily. Rn also informed mother that hgb is 7.2 requiring blood transfusion. [name] instructed to bring [name] [name] to [facility] 11/30/13 for lab at 11:30 then subsequent blood transfusion with 2 units. Pt's mother verbalized understanding and agreement with the above. She does report she will need to leave the patient for her own dr. appt tomorrow, RN informed her this will not be a problem.

## 2013-11-29 NOTE — Patient Instructions (Signed)

## 2013-11-30 ENCOUNTER — Other Ambulatory Visit: Payer: Self-pay | Admitting: *Deleted

## 2013-11-30 ENCOUNTER — Ambulatory Visit (HOSPITAL_BASED_OUTPATIENT_CLINIC_OR_DEPARTMENT_OTHER): Payer: Medicare Other

## 2013-11-30 ENCOUNTER — Other Ambulatory Visit: Payer: Medicare Other

## 2013-11-30 ENCOUNTER — Telehealth: Payer: Self-pay | Admitting: *Deleted

## 2013-11-30 VITALS — BP 114/74 | HR 103 | Temp 97.2°F | Resp 18

## 2013-11-30 DIAGNOSIS — T451X5A Adverse effect of antineoplastic and immunosuppressive drugs, initial encounter: Principal | ICD-10-CM

## 2013-11-30 DIAGNOSIS — D61810 Antineoplastic chemotherapy induced pancytopenia: Secondary | ICD-10-CM

## 2013-11-30 DIAGNOSIS — D649 Anemia, unspecified: Secondary | ICD-10-CM

## 2013-11-30 DIAGNOSIS — C342 Malignant neoplasm of middle lobe, bronchus or lung: Secondary | ICD-10-CM

## 2013-11-30 DIAGNOSIS — C3490 Malignant neoplasm of unspecified part of unspecified bronchus or lung: Secondary | ICD-10-CM

## 2013-11-30 DIAGNOSIS — Z95828 Presence of other vascular implants and grafts: Secondary | ICD-10-CM

## 2013-11-30 LAB — PREPARE RBC (CROSSMATCH)

## 2013-11-30 MED ORDER — ACETAMINOPHEN 325 MG PO TABS: 650.0000 mg | ORAL_TABLET | Freq: Once | ORAL | Status: DC

## 2013-11-30 MED ORDER — HEPARIN SOD (PORK) LOCK FLUSH 100 UNIT/ML IV SOLN
500.0000 [IU] | Freq: Every day | INTRAVENOUS | Status: DC | PRN
  Filled 2013-11-30: qty 5

## 2013-11-30 MED ORDER — DIPHENHYDRAMINE HCL 25 MG PO CAPS
ORAL_CAPSULE | ORAL | Status: AC
  Filled 2013-11-30: qty 1

## 2013-11-30 MED ORDER — SODIUM CHLORIDE 0.9 % IJ SOLN
10.0000 mL | INTRAMUSCULAR | Status: DC | PRN
  Filled 2013-11-30: qty 10

## 2013-11-30 MED ORDER — DIPHENHYDRAMINE HCL 25 MG PO CAPS
25.0000 mg | ORAL_CAPSULE | Freq: Once | ORAL | Status: AC
  Administered 2013-11-30: 25 mg via ORAL

## 2013-11-30 MED ORDER — SODIUM CHLORIDE 0.9 % IJ SOLN
10.0000 mL | INTRAMUSCULAR | Status: DC | PRN
  Administered 2013-11-30: 10 mL via INTRAVENOUS
  Filled 2013-11-30: qty 10

## 2013-11-30 MED ORDER — SODIUM CHLORIDE 0.9 % IV SOLN
250.0000 mL | Freq: Once | INTRAVENOUS | Status: AC
  Administered 2013-11-30: 250 mL via INTRAVENOUS

## 2013-11-30 NOTE — Patient Instructions (Signed)

## 2013-11-30 NOTE — Telephone Encounter (Signed)
Per Dr Vista Mink, pt needs to have calcium rechecked this Friday 12/02/13.  Pt is aware of the appointment.  SLJ

## 2013-11-30 NOTE — Patient Instructions (Signed)
Implanted Port Home Guide  An implanted port is a type of central line that is placed under the skin. Central lines are used to provide IV access when treatment or nutrition needs to be given through a person's veins. Implanted ports are used for long-term IV access. An implanted port may be placed because:    You need IV medicine that would be irritating to the small veins in your hands or arms.    You need long-term IV medicines, such as antibiotics.    You need IV nutrition for a long period.    You need frequent blood draws for lab tests.    You need dialysis.   Implanted ports are usually placed in the chest area, but they can also be placed in the upper arm, the abdomen, or the leg. An implanted port has two main parts:    Reservoir. The reservoir is round and will appear as a small, raised area under your skin. The reservoir is the part where a needle is inserted to give medicines or draw blood.    Catheter. The catheter is a thin, flexible tube that extends from the reservoir. The catheter is placed into a large vein. Medicine that is inserted into the reservoir goes into the catheter and then into the vein.   HOW WILL I CARE FOR MY INCISION SITE?  Do not get the incision site wet. Bathe or shower as directed by your health care Eann Cleland.   HOW IS MY PORT ACCESSED?  Special steps must be taken to access the port:    Before the port is accessed, a numbing cream can be placed on the skin. This helps numb the skin over the port site.    Your health care Motty Borin uses a sterile technique to access the port.   Your health care Joud Ingwersen must put on a mask and sterile gloves.   The skin over your port is cleaned carefully with an antiseptic and allowed to dry.   The port is gently pinched between sterile gloves, and a needle is inserted into the port.   Only "non-coring" port needles should be used to access the port. Once the port is accessed, a blood return should be checked. This helps  ensure that the port is in the vein and is not clogged.    If your port needs to remain accessed for a constant infusion, a clear (transparent) bandage will be placed over the needle site. The bandage and needle will need to be changed every week, or as directed by your health care Germaine Shenker.    Keep the bandage covering the needle clean and dry. Do not get it wet. Follow your health care Mate Alegria's instructions on how to take a shower or bath while the port is accessed.    If your port does not need to stay accessed, no bandage is needed over the port.   WHAT IS FLUSHING?  Flushing helps keep the port from getting clogged. Follow your health care Kimba Lottes's instructions on how and when to flush the port. Ports are usually flushed with saline solution or a medicine called heparin. The need for flushing will depend on how the port is used.    If the port is used for intermittent medicines or blood draws, the port will need to be flushed:    After medicines have been given.    After blood has been drawn.    As part of routine maintenance.    If a constant infusion is   running, the port may not need to be flushed.   HOW LONG WILL MY PORT STAY IMPLANTED?  The port can stay in for as long as your health care Skylene Deremer thinks it is needed. When it is time for the port to come out, surgery will be done to remove it. The procedure is similar to the one performed when the port was put in.   WHEN SHOULD I SEEK IMMEDIATE MEDICAL CARE?  When you have an implanted port, you should seek immediate medical care if:    You notice a bad smell coming from the incision site.    You have swelling, redness, or drainage at the incision site.    You have more swelling or pain at the port site or the surrounding area.    You have a fever that is not controlled with medicine.  Document Released: 05/26/2005 Document Revised: 03/16/2013 Document Reviewed: 01/31/2013  [employer] Patient Information 2015 [employer]. This  information is not intended to replace advice given to you by your health care Dalyn Becker. Make sure you discuss any questions you have with your health care Royale Lennartz.

## 2013-12-01 ENCOUNTER — Telehealth: Payer: Self-pay | Admitting: *Deleted

## 2013-12-01 LAB — TYPE AND SCREEN
ABO/RH(D): O POS
Antibody Screen: NEGATIVE
UNIT DIVISION: 0
Unit division: 0

## 2013-12-01 NOTE — Telephone Encounter (Signed)
Pt called stating he has not really been able to void well since this morning.  He states he can force himself to urinate and he is able to empty his bladder but it is not flowing as well.  Per Dr Vista Mink, pt needs to keeping watching it and if it gets worse he needs to go to the ED for evaluation for urinary retention.  Next f/u 12/06/13.

## 2013-12-02 ENCOUNTER — Telehealth: Payer: Self-pay | Admitting: *Deleted

## 2013-12-02 ENCOUNTER — Ambulatory Visit (HOSPITAL_BASED_OUTPATIENT_CLINIC_OR_DEPARTMENT_OTHER): Payer: Medicare Other

## 2013-12-02 ENCOUNTER — Other Ambulatory Visit (HOSPITAL_BASED_OUTPATIENT_CLINIC_OR_DEPARTMENT_OTHER): Payer: Medicare Other

## 2013-12-02 VITALS — BP 97/72 | HR 112

## 2013-12-02 DIAGNOSIS — Z95828 Presence of other vascular implants and grafts: Secondary | ICD-10-CM

## 2013-12-02 DIAGNOSIS — C342 Malignant neoplasm of middle lobe, bronchus or lung: Secondary | ICD-10-CM

## 2013-12-02 DIAGNOSIS — Z452 Encounter for adjustment and management of vascular access device: Secondary | ICD-10-CM

## 2013-12-02 LAB — COMPREHENSIVE METABOLIC PANEL (CC13)
ALT: 12 U/L (ref 0–55)
AST: 16 U/L (ref 5–34)
Albumin: 2.5 g/dL — ABNORMAL LOW (ref 3.5–5.0)
Alkaline Phosphatase: 167 U/L — ABNORMAL HIGH (ref 40–150)
Anion Gap: 12 mEq/L — ABNORMAL HIGH (ref 3–11)
BILIRUBIN TOTAL: 0.6 mg/dL (ref 0.20–1.20)
BUN: <3 mg/dL — ABNORMAL LOW (ref 7.0–26.0)
CO2: 27 mEq/L (ref 22–29)
CREATININE: 0.6 mg/dL — AB (ref 0.7–1.3)
Calcium: 5.9 mg/dL — CL (ref 8.4–10.4)
Chloride: 102 mEq/L (ref 98–109)
Glucose: 105 mg/dl (ref 70–140)
Potassium: 3.8 mEq/L (ref 3.5–5.1)
Sodium: 141 mEq/L (ref 136–145)
Total Protein: 6.5 g/dL (ref 6.4–8.3)

## 2013-12-02 MED ORDER — SODIUM CHLORIDE 0.9 % IJ SOLN
10.0000 mL | INTRAMUSCULAR | Status: DC | PRN
  Administered 2013-12-02: 10 mL via INTRAVENOUS
  Filled 2013-12-02: qty 10

## 2013-12-02 MED ORDER — HEPARIN SOD (PORK) LOCK FLUSH 100 UNIT/ML IV SOLN
500.0000 [IU] | Freq: Once | INTRAVENOUS | Status: AC
  Administered 2013-12-02: 500 [IU] via INTRAVENOUS
  Filled 2013-12-02: qty 5

## 2013-12-02 NOTE — Telephone Encounter (Signed)
Ca 5.9 , per AJ, pt needs to continue Calcium600mg  BID and will reassess at f/u on 6/30.  Pt's mother verbalized understanding.  SLJ

## 2013-12-06 ENCOUNTER — Ambulatory Visit: Payer: Medicare Other

## 2013-12-06 ENCOUNTER — Ambulatory Visit (HOSPITAL_BASED_OUTPATIENT_CLINIC_OR_DEPARTMENT_OTHER): Payer: Medicare Other | Admitting: Physician Assistant

## 2013-12-06 ENCOUNTER — Encounter: Payer: Self-pay | Admitting: Physician Assistant

## 2013-12-06 ENCOUNTER — Other Ambulatory Visit: Payer: Self-pay | Admitting: *Deleted

## 2013-12-06 ENCOUNTER — Ambulatory Visit (HOSPITAL_BASED_OUTPATIENT_CLINIC_OR_DEPARTMENT_OTHER): Payer: Medicare Other

## 2013-12-06 ENCOUNTER — Other Ambulatory Visit (HOSPITAL_BASED_OUTPATIENT_CLINIC_OR_DEPARTMENT_OTHER): Payer: Medicare Other

## 2013-12-06 ENCOUNTER — Telehealth: Payer: Self-pay | Admitting: Internal Medicine

## 2013-12-06 VITALS — BP 107/65 | HR 124 | Temp 98.0°F | Resp 18 | Ht 71.0 in | Wt 206.1 lb

## 2013-12-06 VITALS — HR 114

## 2013-12-06 DIAGNOSIS — C7951 Secondary malignant neoplasm of bone: Secondary | ICD-10-CM

## 2013-12-06 DIAGNOSIS — C7952 Secondary malignant neoplasm of bone marrow: Secondary | ICD-10-CM

## 2013-12-06 DIAGNOSIS — C342 Malignant neoplasm of middle lobe, bronchus or lung: Secondary | ICD-10-CM

## 2013-12-06 DIAGNOSIS — Z95828 Presence of other vascular implants and grafts: Secondary | ICD-10-CM

## 2013-12-06 DIAGNOSIS — C3490 Malignant neoplasm of unspecified part of unspecified bronchus or lung: Secondary | ICD-10-CM

## 2013-12-06 DIAGNOSIS — Z5111 Encounter for antineoplastic chemotherapy: Secondary | ICD-10-CM

## 2013-12-06 DIAGNOSIS — R0989 Other specified symptoms and signs involving the circulatory and respiratory systems: Secondary | ICD-10-CM

## 2013-12-06 DIAGNOSIS — M549 Dorsalgia, unspecified: Secondary | ICD-10-CM

## 2013-12-06 DIAGNOSIS — R0609 Other forms of dyspnea: Secondary | ICD-10-CM

## 2013-12-06 LAB — CBC WITH DIFFERENTIAL/PLATELET
BASO%: 1 % (ref 0.0–2.0)
BASOS ABS: 0.1 10*3/uL (ref 0.0–0.1)
EOS%: 14 % — AB (ref 0.0–7.0)
Eosinophils Absolute: 1 10*3/uL — ABNORMAL HIGH (ref 0.0–0.5)
HEMATOCRIT: 26.1 % — AB (ref 38.4–49.9)
HEMOGLOBIN: 8.6 g/dL — AB (ref 13.0–17.1)
LYMPH%: 10.9 % — AB (ref 14.0–49.0)
MCH: 28.8 pg (ref 27.2–33.4)
MCHC: 32.9 g/dL (ref 32.0–36.0)
MCV: 87.5 fL (ref 79.3–98.0)
MONO#: 1 10*3/uL — ABNORMAL HIGH (ref 0.1–0.9)
MONO%: 14.6 % — AB (ref 0.0–14.0)
NEUT#: 4.1 10*3/uL (ref 1.5–6.5)
NEUT%: 59.5 % (ref 39.0–75.0)
PLATELETS: 216 10*3/uL (ref 140–400)
RBC: 2.98 10*6/uL — ABNORMAL LOW (ref 4.20–5.82)
RDW: 23.1 % — ABNORMAL HIGH (ref 11.0–14.6)
WBC: 6.9 10*3/uL (ref 4.0–10.3)
lymph#: 0.8 10*3/uL — ABNORMAL LOW (ref 0.9–3.3)

## 2013-12-06 LAB — COMPREHENSIVE METABOLIC PANEL (CC13)
ALT: 10 U/L (ref 0–55)
AST: 11 U/L (ref 5–34)
Albumin: 2.2 g/dL — ABNORMAL LOW (ref 3.5–5.0)
Alkaline Phosphatase: 154 U/L — ABNORMAL HIGH (ref 40–150)
Anion Gap: 10 mEq/L (ref 3–11)
BILIRUBIN TOTAL: 0.42 mg/dL (ref 0.20–1.20)
BUN: 3.3 mg/dL — AB (ref 7.0–26.0)
CHLORIDE: 100 meq/L (ref 98–109)
CO2: 28 mEq/L (ref 22–29)
CREATININE: 0.6 mg/dL — AB (ref 0.7–1.3)
Calcium: 5.9 mg/dL — CL (ref 8.4–10.4)
Glucose: 128 mg/dl (ref 70–140)
Potassium: 3.5 mEq/L (ref 3.5–5.1)
Sodium: 138 mEq/L (ref 136–145)
Total Protein: 6.2 g/dL — ABNORMAL LOW (ref 6.4–8.3)

## 2013-12-06 LAB — TECHNOLOGIST REVIEW

## 2013-12-06 MED ORDER — FLUCONAZOLE 100 MG PO TABS: 100.0000 mg | ORAL_TABLET | Freq: Every day | ORAL | Status: DC

## 2013-12-06 MED ORDER — DEXAMETHASONE SODIUM PHOSPHATE 20 MG/5ML IJ SOLN
INTRAMUSCULAR | Status: AC
  Filled 2013-12-06: qty 5

## 2013-12-06 MED ORDER — SODIUM CHLORIDE 0.9 % IV SOLN
500.0000 mg/m2 | Freq: Once | INTRAVENOUS | Status: AC
  Administered 2013-12-06: 1050 mg via INTRAVENOUS
  Filled 2013-12-06: qty 42

## 2013-12-06 MED ORDER — SODIUM CHLORIDE 0.9 % IJ SOLN
10.0000 mL | INTRAMUSCULAR | Status: DC | PRN
  Administered 2013-12-06: 10 mL
  Filled 2013-12-06: qty 10

## 2013-12-06 MED ORDER — ONDANSETRON 16 MG/50ML IVPB (CHCC)
16.0000 mg | Freq: Once | INTRAVENOUS | Status: AC
  Administered 2013-12-06: 16 mg via INTRAVENOUS

## 2013-12-06 MED ORDER — SODIUM CHLORIDE 0.9 % IJ SOLN
10.0000 mL | INTRAMUSCULAR | Status: DC | PRN
  Administered 2013-12-06: 10 mL via INTRAVENOUS
  Filled 2013-12-06: qty 10

## 2013-12-06 MED ORDER — DEXAMETHASONE SODIUM PHOSPHATE 20 MG/5ML IJ SOLN
20.0000 mg | Freq: Once | INTRAMUSCULAR | Status: AC
  Administered 2013-12-06: 20 mg via INTRAVENOUS

## 2013-12-06 MED ORDER — OXYCODONE-ACETAMINOPHEN 5-325 MG PO TABS
ORAL_TABLET | ORAL | Status: AC
  Filled 2013-12-06: qty 2

## 2013-12-06 MED ORDER — SODIUM CHLORIDE 0.9 % IV SOLN
750.0000 mg | Freq: Once | INTRAVENOUS | Status: AC
  Administered 2013-12-06: 750 mg via INTRAVENOUS
  Filled 2013-12-06: qty 75

## 2013-12-06 MED ORDER — SODIUM CHLORIDE 0.9 % IV SOLN
Freq: Once | INTRAVENOUS | Status: DC
  Filled 2013-12-06: qty 100

## 2013-12-06 MED ORDER — SODIUM CHLORIDE 0.9 % IV SOLN
Freq: Once | INTRAVENOUS | Status: AC
  Administered 2013-12-06: 11:00:00 via INTRAVENOUS

## 2013-12-06 MED ORDER — OXYCODONE-ACETAMINOPHEN 5-325 MG PO TABS: 2.0000 | ORAL_TABLET | Freq: Once | ORAL | Status: DC

## 2013-12-06 MED ORDER — ONDANSETRON 16 MG/50ML IVPB (CHCC)
INTRAVENOUS | Status: AC
  Filled 2013-12-06: qty 16

## 2013-12-06 MED ORDER — HEPARIN SOD (PORK) LOCK FLUSH 100 UNIT/ML IV SOLN
500.0000 [IU] | Freq: Once | INTRAVENOUS | Status: AC | PRN
  Administered 2013-12-06: 500 [IU]
  Filled 2013-12-06: qty 5

## 2013-12-06 MED ORDER — SODIUM CHLORIDE 0.9 % IV SOLN
Freq: Once | INTRAVENOUS | Status: AC
  Administered 2013-12-06: 12:00:00 via INTRAVENOUS
  Filled 2013-12-06: qty 100

## 2013-12-06 MED ORDER — SODIUM CHLORIDE 0.9 % IJ SOLN
10.0000 mL | INTRAMUSCULAR | Status: DC | PRN
  Filled 2013-12-06: qty 10
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
## 2013-12-06 NOTE — Patient Instructions (Signed)
Implanted Port Home Guide  An implanted port is a type of central line that is placed under the skin. Central lines are used to provide IV access when treatment or nutrition needs to be given through a person's veins. Implanted ports are used for long-term IV access. An implanted port may be placed because:    You need IV medicine that would be irritating to the small veins in your hands or arms.    You need long-term IV medicines, such as antibiotics.    You need IV nutrition for a long period.    You need frequent blood draws for lab tests.    You need dialysis.   Implanted ports are usually placed in the chest area, but they can also be placed in the upper arm, the abdomen, or the leg. An implanted port has two main parts:    Reservoir. The reservoir is round and will appear as a small, raised area under your skin. The reservoir is the part where a needle is inserted to give medicines or draw blood.    Catheter. The catheter is a thin, flexible tube that extends from the reservoir. The catheter is placed into a large vein. Medicine that is inserted into the reservoir goes into the catheter and then into the vein.   HOW WILL I CARE FOR MY INCISION SITE?  Do not get the incision site wet. Bathe or shower as directed by your health care Tatiyana Foucher.   HOW IS MY PORT ACCESSED?  Special steps must be taken to access the port:    Before the port is accessed, a numbing cream can be placed on the skin. This helps numb the skin over the port site.    Your health care Zitlali Primm uses a sterile technique to access the port.   Your health care Aasha Dina must put on a mask and sterile gloves.   The skin over your port is cleaned carefully with an antiseptic and allowed to dry.   The port is gently pinched between sterile gloves, and a needle is inserted into the port.   Only "non-coring" port needles should be used to access the port. Once the port is accessed, a blood return should be checked. This helps  ensure that the port is in the vein and is not clogged.    If your port needs to remain accessed for a constant infusion, a clear (transparent) bandage will be placed over the needle site. The bandage and needle will need to be changed every week, or as directed by your health care Kallee Nam.    Keep the bandage covering the needle clean and dry. Do not get it wet. Follow your health care Shauntel Prest's instructions on how to take a shower or bath while the port is accessed.    If your port does not need to stay accessed, no bandage is needed over the port.   WHAT IS FLUSHING?  Flushing helps keep the port from getting clogged. Follow your health care Yuliet Needs's instructions on how and when to flush the port. Ports are usually flushed with saline solution or a medicine called heparin. The need for flushing will depend on how the port is used.    If the port is used for intermittent medicines or blood draws, the port will need to be flushed:    After medicines have been given.    After blood has been drawn.    As part of routine maintenance.    If a constant infusion is   running, the port may not need to be flushed.   HOW LONG WILL MY PORT STAY IMPLANTED?  The port can stay in for as long as your health care Etha Stambaugh thinks it is needed. When it is time for the port to come out, surgery will be done to remove it. The procedure is similar to the one performed when the port was put in.   WHEN SHOULD I SEEK IMMEDIATE MEDICAL CARE?  When you have an implanted port, you should seek immediate medical care if:    You notice a bad smell coming from the incision site.    You have swelling, redness, or drainage at the incision site.    You have more swelling or pain at the port site or the surrounding area.    You have a fever that is not controlled with medicine.  Document Released: 05/26/2005 Document Revised: 03/16/2013 Document Reviewed: 01/31/2013  [employer] Patient Information 2015 [employer]. This  information is not intended to replace advice given to you by your health care Gabryella Murfin. Make sure you discuss any questions you have with your health care Loyde Orth.

## 2013-12-06 NOTE — Progress Notes (Signed)
OK to tx per Adrena with hgb 8.6  1130-Pt complaining of "sharp pain to lower back" at a rate of "7/10".  Pt is taking Oxycontin 60mg  BID and took this morning at 0600.  He also took Oxycodone 10mg  at 0700 this AM.  Dr. Julien Nordmann notified and order received to give pt Percocet 5/325 mg two tablets PO now.    1135-Pt.'s mother states that she has Oxycodone tablets with her.  Pt to take Oxycodone 10mg  PO now.

## 2013-12-06 NOTE — Telephone Encounter (Signed)
Informed patient mother that a prescription for Diflucan has been sent to patient's pharmacy.

## 2013-12-06 NOTE — Telephone Encounter (Signed)
gv and printed appt sched and avs fo rpt for July °

## 2013-12-06 NOTE — Patient Instructions (Addendum)
[facility] Discharge Instructions for Patients Receiving Chemotherapy  Today you received the following chemotherapy agents ALIMTA, CARBOPLATIN,   To help prevent nausea and vomiting after your treatment, we encourage you to take your nausea medication IF NEEDED   If you develop nausea and vomiting that is not controlled by your nausea medication, call the clinic.   BELOW ARE SYMPTOMS THAT SHOULD BE REPORTED IMMEDIATELY:  *FEVER GREATER THAN 100.5 F  *CHILLS WITH OR WITHOUT FEVER  NAUSEA AND VOMITING THAT IS NOT CONTROLLED WITH YOUR NAUSEA MEDICATION  *UNUSUAL SHORTNESS OF BREATH  *UNUSUAL BRUISING OR BLEEDING  TENDERNESS IN MOUTH AND THROAT WITH OR WITHOUT PRESENCE OF ULCERS  *URINARY PROBLEMS  *BOWEL PROBLEMS  UNUSUAL RASH Items with * indicate a potential emergency and should be followed up as soon as possible.  Feel free to call the clinic you have any questions or concerns. The clinic phone number is [phone].   Hypocalcemia, Adult Hypocalcemia is low blood calcium. Calcium is important for cells to function in the body. Low blood calcium can cause a variety of symptoms and problems. CAUSES   Low levels of a body protein called albumin.  Problems with the parathyroid glands or surgical removal of the parathyroid glands. The parathyroid glands maintain the body's level of calcium.  Decreased production or improper use of parathyroid hormone.  Lack (deficiency) of vitamin D or magnesium or both.  Intestinal problems that interfere with nutrient absorption.  Alcoholism.  Kidney problems.  Inflammation of the pancreas (pancreatitis).  Certain medicines.  Severe infections (sepsis).  Infiltrative diseases. With these diseases the parathyroid glands are filled with cells or substances that are not normally present. Examples include:  Sarcoidosis.  Hemachromatosis.  Breakdown of large amounts of muscle fiber.  High levels of  phosphate in the body.  Cancer.  Massive blood transfusions which usually occur with severe trauma. SYMPTOMS   Numbness and tingling in the fingers, toes, or around the mouth.  Muscle aches or cramps, especially in the legs, feet, and back.  Muscle twitches.  Shortness of breath or wheezing.  Difficulty swallowing.  Changes in the sound of the voice.  General weakness.  Fainting.  Fast heart beats (palpitations).  Chest pain.  Irritability.  Difficulty thinking.  Memory problems or confusion.  Severe fatigue.  Changes in personality.  Depression and anxiety.  Shaking uncontrollably (seizures).  Coarse, brittle hair and nails.  Dry skin or lasting (chronic) skin diseases (psoriasis, eczema, or dermatitis).  Clouding of the eye lens (cataracts).  Abdominal cramping or pain. DIAGNOSIS  Hypocalcemia is usually diagnosed through blood tests that reveal a low level of blood calcium. Other tests, such as a recording of the electrical activity of the heart (electrocardiogram, EKG), may be performed in order to diagnose the underlying cause of the condition. TREATMENT  Treatment for hypocalcemia includes giving calcium supplements. These can be given by mouth or by intravenous (IV) access tube, depending on the severity of the symptoms and deficiency. Other minerals (electrolytes), such as magnesium, may also be given. HOME CARE INSTRUCTIONS   Meet with a dietitian to make sure you are eating the most healthful diet possible, or follow diet instructions as directed by your caregiver.  Follow up with your caregiver as directed. SEEK IMMEDIATE MEDICAL CARE IF:   You develop chest pain.  You develop persistent rapid or irregular heartbeats.  You have difficulty breathing.  You faint.  You develop increased fatigue.  You have new swelling in the feet,  ankles, or legs.  You develop increased muscle twitching.  You start to have seizures.  You develop  confusion.  You develop mood, memory, or personality changes. MAKE SURE YOU:   Understand these instructions.  Will watch your condition.  Will get help right away if you are not doing well or get worse. Document Released: 11/13/2009 Document Revised: 08/18/2011 Document Reviewed: 11/13/2009 [facility] Patient Information 2015 [place], [place]. This information is not intended to replace advice given to you by your health care provider. Make sure you discuss any questions you have with your health care provider.

## 2013-12-08 NOTE — Patient Instructions (Signed)
Continue weekly labs as scheduled Follow up in 3 weeks, prior to your next scheduled cycle of chemotherapy

## 2013-12-13 ENCOUNTER — Ambulatory Visit (HOSPITAL_BASED_OUTPATIENT_CLINIC_OR_DEPARTMENT_OTHER): Payer: Medicare Other

## 2013-12-13 ENCOUNTER — Other Ambulatory Visit (HOSPITAL_BASED_OUTPATIENT_CLINIC_OR_DEPARTMENT_OTHER): Payer: Medicare Other

## 2013-12-13 ENCOUNTER — Other Ambulatory Visit: Payer: Self-pay | Admitting: Medical Oncology

## 2013-12-13 ENCOUNTER — Encounter: Payer: Self-pay | Admitting: Internal Medicine

## 2013-12-13 ENCOUNTER — Other Ambulatory Visit: Payer: Self-pay | Admitting: Internal Medicine

## 2013-12-13 VITALS — BP 101/77 | HR 100 | Temp 97.2°F

## 2013-12-13 DIAGNOSIS — C342 Malignant neoplasm of middle lobe, bronchus or lung: Secondary | ICD-10-CM

## 2013-12-13 DIAGNOSIS — C3490 Malignant neoplasm of unspecified part of unspecified bronchus or lung: Secondary | ICD-10-CM

## 2013-12-13 DIAGNOSIS — R202 Paresthesia of skin: Secondary | ICD-10-CM

## 2013-12-13 DIAGNOSIS — Z452 Encounter for adjustment and management of vascular access device: Secondary | ICD-10-CM

## 2013-12-13 DIAGNOSIS — C7952 Secondary malignant neoplasm of bone marrow: Secondary | ICD-10-CM

## 2013-12-13 DIAGNOSIS — Z95828 Presence of other vascular implants and grafts: Secondary | ICD-10-CM

## 2013-12-13 DIAGNOSIS — C7951 Secondary malignant neoplasm of bone: Secondary | ICD-10-CM

## 2013-12-13 LAB — COMPREHENSIVE METABOLIC PANEL (CC13)
ALT: 19 U/L (ref 0–55)
AST: 16 U/L (ref 5–34)
Albumin: 2.7 g/dL — ABNORMAL LOW (ref 3.5–5.0)
Alkaline Phosphatase: 154 U/L — ABNORMAL HIGH (ref 40–150)
Anion Gap: 12 mEq/L — ABNORMAL HIGH (ref 3–11)
BILIRUBIN TOTAL: 0.49 mg/dL (ref 0.20–1.20)
BUN: 6.2 mg/dL — ABNORMAL LOW (ref 7.0–26.0)
CALCIUM: 7.4 mg/dL — AB (ref 8.4–10.4)
CHLORIDE: 101 meq/L (ref 98–109)
CO2: 24 mEq/L (ref 22–29)
CREATININE: 0.7 mg/dL (ref 0.7–1.3)
Glucose: 105 mg/dl (ref 70–140)
Potassium: 3.6 mEq/L (ref 3.5–5.1)
Sodium: 137 mEq/L (ref 136–145)
Total Protein: 7.2 g/dL (ref 6.4–8.3)

## 2013-12-13 LAB — CBC WITH DIFFERENTIAL/PLATELET
BASO%: 1.3 % (ref 0.0–2.0)
Basophils Absolute: 0 10*3/uL (ref 0.0–0.1)
EOS%: 9.1 % — AB (ref 0.0–7.0)
Eosinophils Absolute: 0.3 10*3/uL (ref 0.0–0.5)
HEMATOCRIT: 23.8 % — AB (ref 38.4–49.9)
HGB: 7.8 g/dL — ABNORMAL LOW (ref 13.0–17.1)
LYMPH#: 0.5 10*3/uL — AB (ref 0.9–3.3)
LYMPH%: 16.1 % (ref 14.0–49.0)
MCH: 28.1 pg (ref 27.2–33.4)
MCHC: 32.8 g/dL (ref 32.0–36.0)
MCV: 85.6 fL (ref 79.3–98.0)
MONO#: 0.1 10*3/uL (ref 0.1–0.9)
MONO%: 4.9 % (ref 0.0–14.0)
NEUT#: 2 10*3/uL (ref 1.5–6.5)
NEUT%: 68.6 % (ref 39.0–75.0)
Platelets: 83 10*3/uL — ABNORMAL LOW (ref 140–400)
RBC: 2.78 10*6/uL — ABNORMAL LOW (ref 4.20–5.82)
RDW: 21.1 % — ABNORMAL HIGH (ref 11.0–14.6)
WBC: 3 10*3/uL — ABNORMAL LOW (ref 4.0–10.3)

## 2013-12-13 MED ORDER — SODIUM CHLORIDE 0.9 % IJ SOLN
10.0000 mL | INTRAMUSCULAR | Status: DC | PRN
  Administered 2013-12-13: 10 mL via INTRAVENOUS
  Filled 2013-12-13: qty 10

## 2013-12-13 MED ORDER — HEPARIN SOD (PORK) LOCK FLUSH 100 UNIT/ML IV SOLN
500.0000 [IU] | Freq: Once | INTRAVENOUS | Status: AC
  Administered 2013-12-13: 500 [IU] via INTRAVENOUS
  Filled 2013-12-13: qty 5

## 2013-12-13 MED ORDER — OXYCODONE HCL 10 MG PO TABS: 10.0000 mg | ORAL_TABLET | ORAL | Status: DC | PRN

## 2013-12-13 MED ORDER — PREGABALIN 50 MG PO CAPS: 50.0000 mg | ORAL_CAPSULE | Freq: Three times a day (TID) | ORAL | Status: DC

## 2013-12-13 NOTE — Telephone Encounter (Signed)
Refill locked in injection room

## 2013-12-13 NOTE — Patient Instructions (Signed)

## 2013-12-15 ENCOUNTER — Telehealth: Payer: Self-pay

## 2013-12-15 MED ORDER — PREGABALIN 50 MG PO CAPS: 50.0000 mg | ORAL_CAPSULE | Freq: Three times a day (TID) | ORAL | Status: DC

## 2013-12-15 NOTE — Telephone Encounter (Signed)
Script for Lyrica 50 mg has been phoned in to [facility]

## 2013-12-15 NOTE — Telephone Encounter (Signed)
Patient's Mom called and states that she went to [facility] but they did not have his faxed Lyrica prescription. Can we re-fax?

## 2013-12-20 ENCOUNTER — Ambulatory Visit (HOSPITAL_COMMUNITY)
Admission: RE | Admit: 2013-12-20 | Discharge: 2013-12-20 | Disposition: A | Discharge status: Home / Self Care | Payer: Medicare Other | Source: Ambulatory Visit | Attending: Internal Medicine | Admitting: Internal Medicine

## 2013-12-20 ENCOUNTER — Other Ambulatory Visit: Payer: Self-pay | Admitting: Medical Oncology

## 2013-12-20 ENCOUNTER — Other Ambulatory Visit: Payer: Medicare Other

## 2013-12-20 ENCOUNTER — Telehealth: Payer: Self-pay | Admitting: Medical Oncology

## 2013-12-20 ENCOUNTER — Ambulatory Visit: Payer: Medicare Other

## 2013-12-20 ENCOUNTER — Other Ambulatory Visit (HOSPITAL_BASED_OUTPATIENT_CLINIC_OR_DEPARTMENT_OTHER): Payer: Medicare Other

## 2013-12-20 ENCOUNTER — Ambulatory Visit (HOSPITAL_BASED_OUTPATIENT_CLINIC_OR_DEPARTMENT_OTHER): Payer: Medicare Other

## 2013-12-20 VITALS — BP 105/70 | HR 106 | Temp 98.0°F | Resp 18

## 2013-12-20 DIAGNOSIS — Z95828 Presence of other vascular implants and grafts: Secondary | ICD-10-CM

## 2013-12-20 DIAGNOSIS — D6481 Anemia due to antineoplastic chemotherapy: Secondary | ICD-10-CM

## 2013-12-20 DIAGNOSIS — T451X5A Adverse effect of antineoplastic and immunosuppressive drugs, initial encounter: Secondary | ICD-10-CM | POA: Diagnosis not present

## 2013-12-20 DIAGNOSIS — D649 Anemia, unspecified: Secondary | ICD-10-CM

## 2013-12-20 DIAGNOSIS — D61810 Antineoplastic chemotherapy induced pancytopenia: Secondary | ICD-10-CM | POA: Insufficient documentation

## 2013-12-20 DIAGNOSIS — C3490 Malignant neoplasm of unspecified part of unspecified bronchus or lung: Secondary | ICD-10-CM

## 2013-12-20 LAB — COMPREHENSIVE METABOLIC PANEL (CC13)
ALBUMIN: 2.7 g/dL — AB (ref 3.5–5.0)
ALK PHOS: 140 U/L (ref 40–150)
ALT: 13 U/L (ref 0–55)
AST: 12 U/L (ref 5–34)
Anion Gap: 12 mEq/L — ABNORMAL HIGH (ref 3–11)
BILIRUBIN TOTAL: 0.46 mg/dL (ref 0.20–1.20)
BUN: 6.7 mg/dL — AB (ref 7.0–26.0)
CO2: 25 mEq/L (ref 22–29)
Calcium: 6.8 mg/dL — ABNORMAL LOW (ref 8.4–10.4)
Chloride: 99 mEq/L (ref 98–109)
Creatinine: 0.7 mg/dL (ref 0.7–1.3)
Glucose: 121 mg/dl (ref 70–140)
Potassium: 4 mEq/L (ref 3.5–5.1)
Sodium: 135 mEq/L — ABNORMAL LOW (ref 136–145)
Total Protein: 6.9 g/dL (ref 6.4–8.3)

## 2013-12-20 LAB — CBC WITH DIFFERENTIAL/PLATELET
BASO%: 0 % (ref 0.0–2.0)
Basophils Absolute: 0 10*3/uL (ref 0.0–0.1)
EOS%: 4.8 % (ref 0.0–7.0)
Eosinophils Absolute: 0.2 10*3/uL (ref 0.0–0.5)
HEMATOCRIT: 16.7 % — AB (ref 38.4–49.9)
HEMOGLOBIN: 5.4 g/dL — AB (ref 13.0–17.1)
LYMPH%: 9.3 % — ABNORMAL LOW (ref 14.0–49.0)
MCH: 27.7 pg (ref 27.2–33.4)
MCHC: 32.3 g/dL (ref 32.0–36.0)
MCV: 85.6 fL (ref 79.3–98.0)
MONO#: 0.6 10*3/uL (ref 0.1–0.9)
MONO%: 11.6 % (ref 0.0–14.0)
NEUT#: 3.6 10*3/uL (ref 1.5–6.5)
NEUT%: 74.3 % (ref 39.0–75.0)
Platelets: 16 10*3/uL — ABNORMAL LOW (ref 140–400)
RBC: 1.95 10*6/uL — ABNORMAL LOW (ref 4.20–5.82)
RDW: 21 % — ABNORMAL HIGH (ref 11.0–14.6)
WBC: 4.8 10*3/uL (ref 4.0–10.3)
lymph#: 0.5 10*3/uL — ABNORMAL LOW (ref 0.9–3.3)
nRBC: 1 % — ABNORMAL HIGH (ref 0–0)

## 2013-12-20 MED ORDER — HEPARIN SOD (PORK) LOCK FLUSH 100 UNIT/ML IV SOLN
500.0000 [IU] | Freq: Every day | INTRAVENOUS | Status: AC | PRN
  Administered 2013-12-20: 500 [IU]
  Filled 2013-12-20: qty 5

## 2013-12-20 MED ORDER — DIPHENHYDRAMINE HCL 25 MG PO CAPS
25.0000 mg | ORAL_CAPSULE | Freq: Once | ORAL | Status: AC
  Administered 2013-12-20: 25 mg via ORAL

## 2013-12-20 MED ORDER — ACETAMINOPHEN 325 MG PO TABS
650.0000 mg | ORAL_TABLET | Freq: Once | ORAL | Status: AC
  Administered 2013-12-20: 650 mg via ORAL

## 2013-12-20 MED ORDER — SODIUM CHLORIDE 0.9 % IV SOLN
250.0000 mL | Freq: Once | INTRAVENOUS | Status: AC
  Administered 2013-12-20: 250 mL via INTRAVENOUS

## 2013-12-20 MED ORDER — HEPARIN SOD (PORK) LOCK FLUSH 100 UNIT/ML IV SOLN
500.0000 [IU] | Freq: Once | INTRAVENOUS | Status: AC
  Administered 2013-12-20: 500 [IU] via INTRAVENOUS
  Filled 2013-12-20: qty 5

## 2013-12-20 MED ORDER — SODIUM CHLORIDE 0.9 % IJ SOLN
10.0000 mL | INTRAMUSCULAR | Status: DC | PRN
  Administered 2013-12-20: 10 mL via INTRAVENOUS
  Filled 2013-12-20: qty 10

## 2013-12-20 MED ORDER — SODIUM CHLORIDE 0.9 % IJ SOLN
10.0000 mL | INTRAMUSCULAR | Status: AC | PRN
  Administered 2013-12-20: 10 mL
  Filled 2013-12-20: qty 10

## 2013-12-20 MED ORDER — DIPHENHYDRAMINE HCL 25 MG PO CAPS
ORAL_CAPSULE | ORAL | Status: AC
  Filled 2013-12-20: qty 1

## 2013-12-20 MED ORDER — ACETAMINOPHEN 325 MG PO TABS
ORAL_TABLET | ORAL | Status: AC
  Filled 2013-12-20: qty 2

## 2013-12-20 NOTE — Telephone Encounter (Signed)
Transfusion scheduled with pt mother.

## 2013-12-20 NOTE — Patient Instructions (Signed)

## 2013-12-20 NOTE — Progress Notes (Signed)
Pt refuses to come today for blood transfusion. He  is scheduled for tomorrow . I instructed his mother to get him to Ed if he experiences worsening SOB, fatigue , new onset of bleeding , Chest pain, dizziness.

## 2013-12-20 NOTE — Patient Instructions (Signed)

## 2013-12-20 NOTE — Progress Notes (Signed)
HAR called for.

## 2013-12-21 ENCOUNTER — Ambulatory Visit (HOSPITAL_BASED_OUTPATIENT_CLINIC_OR_DEPARTMENT_OTHER): Payer: Medicare Other

## 2013-12-21 ENCOUNTER — Other Ambulatory Visit: Payer: Self-pay | Admitting: *Deleted

## 2013-12-21 ENCOUNTER — Other Ambulatory Visit: Payer: Self-pay

## 2013-12-21 VITALS — BP 100/61 | HR 102 | Temp 98.6°F | Resp 18

## 2013-12-21 DIAGNOSIS — T451X5A Adverse effect of antineoplastic and immunosuppressive drugs, initial encounter: Principal | ICD-10-CM

## 2013-12-21 DIAGNOSIS — D649 Anemia, unspecified: Secondary | ICD-10-CM

## 2013-12-21 DIAGNOSIS — D61810 Antineoplastic chemotherapy induced pancytopenia: Secondary | ICD-10-CM | POA: Diagnosis not present

## 2013-12-21 DIAGNOSIS — D6481 Anemia due to antineoplastic chemotherapy: Secondary | ICD-10-CM

## 2013-12-21 LAB — PREPARE RBC (CROSSMATCH)

## 2013-12-21 MED ORDER — HEPARIN SOD (PORK) LOCK FLUSH 100 UNIT/ML IV SOLN
500.0000 [IU] | Freq: Every day | INTRAVENOUS | Status: AC | PRN
  Administered 2013-12-21: 500 [IU]
  Filled 2013-12-21: qty 5

## 2013-12-21 MED ORDER — DIPHENHYDRAMINE HCL 25 MG PO CAPS
25.0000 mg | ORAL_CAPSULE | Freq: Once | ORAL | Status: AC
  Administered 2013-12-21: 25 mg via ORAL

## 2013-12-21 MED ORDER — DIPHENHYDRAMINE HCL 25 MG PO CAPS
ORAL_CAPSULE | ORAL | Status: AC
  Filled 2013-12-21: qty 1

## 2013-12-21 MED ORDER — ACETAMINOPHEN 325 MG PO TABS
ORAL_TABLET | ORAL | Status: AC
  Filled 2013-12-21: qty 2

## 2013-12-21 MED ORDER — ACETAMINOPHEN 325 MG PO TABS
650.0000 mg | ORAL_TABLET | Freq: Once | ORAL | Status: AC
  Administered 2013-12-21: 650 mg via ORAL

## 2013-12-21 MED ORDER — SODIUM CHLORIDE 0.9 % IJ SOLN
10.0000 mL | INTRAMUSCULAR | Status: AC | PRN
  Administered 2013-12-21: 10 mL
  Filled 2013-12-21: qty 10

## 2013-12-21 MED ORDER — SODIUM CHLORIDE 0.9 % IV SOLN
250.0000 mL | Freq: Once | INTRAVENOUS | Status: AC
  Administered 2013-12-21: 250 mL via INTRAVENOUS

## 2013-12-21 NOTE — Patient Instructions (Signed)

## 2013-12-22 LAB — TYPE AND SCREEN
ABO/RH(D): O POS
Antibody Screen: NEGATIVE
UNIT DIVISION: 0
Unit division: 0

## 2013-12-27 ENCOUNTER — Other Ambulatory Visit (HOSPITAL_BASED_OUTPATIENT_CLINIC_OR_DEPARTMENT_OTHER): Payer: Medicare Other

## 2013-12-27 ENCOUNTER — Other Ambulatory Visit: Payer: Self-pay | Admitting: *Deleted

## 2013-12-27 ENCOUNTER — Ambulatory Visit: Payer: Self-pay

## 2013-12-27 ENCOUNTER — Telehealth: Payer: Self-pay | Admitting: Internal Medicine

## 2013-12-27 ENCOUNTER — Ambulatory Visit (HOSPITAL_BASED_OUTPATIENT_CLINIC_OR_DEPARTMENT_OTHER): Payer: Medicare Other | Admitting: Nurse Practitioner

## 2013-12-27 ENCOUNTER — Other Ambulatory Visit: Payer: Self-pay

## 2013-12-27 ENCOUNTER — Ambulatory Visit: Payer: Medicare Other

## 2013-12-27 ENCOUNTER — Ambulatory Visit (HOSPITAL_BASED_OUTPATIENT_CLINIC_OR_DEPARTMENT_OTHER): Payer: Medicare Other

## 2013-12-27 VITALS — BP 111/73 | HR 108 | Temp 98.3°F | Resp 19

## 2013-12-27 VITALS — BP 101/67 | HR 124 | Temp 98.5°F | Resp 18 | Ht 71.0 in | Wt 198.7 lb

## 2013-12-27 DIAGNOSIS — M549 Dorsalgia, unspecified: Secondary | ICD-10-CM

## 2013-12-27 DIAGNOSIS — T451X5A Adverse effect of antineoplastic and immunosuppressive drugs, initial encounter: Secondary | ICD-10-CM

## 2013-12-27 DIAGNOSIS — D6481 Anemia due to antineoplastic chemotherapy: Secondary | ICD-10-CM

## 2013-12-27 DIAGNOSIS — C7951 Secondary malignant neoplasm of bone: Secondary | ICD-10-CM

## 2013-12-27 DIAGNOSIS — C342 Malignant neoplasm of middle lobe, bronchus or lung: Secondary | ICD-10-CM

## 2013-12-27 DIAGNOSIS — R059 Cough, unspecified: Secondary | ICD-10-CM

## 2013-12-27 DIAGNOSIS — D6959 Other secondary thrombocytopenia: Secondary | ICD-10-CM

## 2013-12-27 DIAGNOSIS — C7952 Secondary malignant neoplasm of bone marrow: Secondary | ICD-10-CM

## 2013-12-27 DIAGNOSIS — R05 Cough: Secondary | ICD-10-CM

## 2013-12-27 DIAGNOSIS — D61810 Antineoplastic chemotherapy induced pancytopenia: Secondary | ICD-10-CM | POA: Diagnosis not present

## 2013-12-27 DIAGNOSIS — Z95828 Presence of other vascular implants and grafts: Secondary | ICD-10-CM

## 2013-12-27 DIAGNOSIS — C3490 Malignant neoplasm of unspecified part of unspecified bronchus or lung: Secondary | ICD-10-CM

## 2013-12-27 LAB — CBC WITH DIFFERENTIAL/PLATELET
BASO%: 0.5 % (ref 0.0–2.0)
BASOS ABS: 0 10*3/uL (ref 0.0–0.1)
EOS%: 6.9 % (ref 0.0–7.0)
Eosinophils Absolute: 0.3 10*3/uL (ref 0.0–0.5)
HCT: 23.4 % — ABNORMAL LOW (ref 38.4–49.9)
HEMOGLOBIN: 7.5 g/dL — AB (ref 13.0–17.1)
LYMPH%: 15.1 % (ref 14.0–49.0)
MCH: 29.2 pg (ref 27.2–33.4)
MCHC: 32.1 g/dL (ref 32.0–36.0)
MCV: 91.1 fL (ref 79.3–98.0)
MONO#: 0.7 10*3/uL (ref 0.1–0.9)
MONO%: 17.9 % — AB (ref 0.0–14.0)
NEUT#: 2.3 10*3/uL (ref 1.5–6.5)
NEUT%: 59.6 % (ref 39.0–75.0)
PLATELETS: 61 10*3/uL — AB (ref 140–400)
RBC: 2.57 10*6/uL — ABNORMAL LOW (ref 4.20–5.82)
RDW: 22.3 % — AB (ref 11.0–14.6)
WBC: 3.9 10*3/uL — ABNORMAL LOW (ref 4.0–10.3)
lymph#: 0.6 10*3/uL — ABNORMAL LOW (ref 0.9–3.3)
nRBC: 0 % (ref 0–0)

## 2013-12-27 LAB — COMPREHENSIVE METABOLIC PANEL (CC13)
ALT: 11 U/L (ref 0–55)
ANION GAP: 11 meq/L (ref 3–11)
AST: 14 U/L (ref 5–34)
Albumin: 2.5 g/dL — ABNORMAL LOW (ref 3.5–5.0)
Alkaline Phosphatase: 142 U/L (ref 40–150)
BILIRUBIN TOTAL: 0.45 mg/dL (ref 0.20–1.20)
BUN: 6 mg/dL — AB (ref 7.0–26.0)
CALCIUM: 6.8 mg/dL — AB (ref 8.4–10.4)
CO2: 26 meq/L (ref 22–29)
CREATININE: 0.6 mg/dL — AB (ref 0.7–1.3)
Chloride: 102 mEq/L (ref 98–109)
GLUCOSE: 112 mg/dL (ref 70–140)
Potassium: 3.8 mEq/L (ref 3.5–5.1)
Sodium: 138 mEq/L (ref 136–145)
Total Protein: 6.8 g/dL (ref 6.4–8.3)

## 2013-12-27 LAB — PREPARE RBC (CROSSMATCH)

## 2013-12-27 MED ORDER — OXYCODONE HCL ER 60 MG PO T12A
1.0000 | EXTENDED_RELEASE_TABLET | Freq: Two times a day (BID) | ORAL | Status: DC
Start: 2013-12-27 — End: 2014-01-24

## 2013-12-27 MED ORDER — SODIUM CHLORIDE 0.9 % IJ SOLN
10.0000 mL | INTRAMUSCULAR | Status: AC | PRN
  Administered 2013-12-27: 10 mL
  Filled 2013-12-27: qty 10

## 2013-12-27 MED ORDER — DIPHENHYDRAMINE HCL 25 MG PO CAPS
ORAL_CAPSULE | ORAL | Status: AC
  Filled 2013-12-27: qty 1

## 2013-12-27 MED ORDER — ACETAMINOPHEN 325 MG PO TABS
ORAL_TABLET | ORAL | Status: AC
  Filled 2013-12-27: qty 2

## 2013-12-27 MED ORDER — ACETAMINOPHEN 325 MG PO TABS
650.0000 mg | ORAL_TABLET | Freq: Once | ORAL | Status: AC
  Administered 2013-12-27: 650 mg via ORAL

## 2013-12-27 MED ORDER — SODIUM CHLORIDE 0.9 % IV SOLN
250.0000 mL | Freq: Once | INTRAVENOUS | Status: AC
  Administered 2013-12-27: 250 mL via INTRAVENOUS

## 2013-12-27 MED ORDER — HEPARIN SOD (PORK) LOCK FLUSH 100 UNIT/ML IV SOLN
500.0000 [IU] | Freq: Every day | INTRAVENOUS | Status: AC | PRN
  Administered 2013-12-27: 500 [IU]
  Filled 2013-12-27: qty 5

## 2013-12-27 MED ORDER — SODIUM CHLORIDE 0.9 % IJ SOLN
10.0000 mL | INTRAMUSCULAR | Status: DC | PRN
  Administered 2013-12-27: 10 mL via INTRAVENOUS
  Filled 2013-12-27: qty 10

## 2013-12-27 MED ORDER — DIPHENHYDRAMINE HCL 25 MG PO CAPS
25.0000 mg | ORAL_CAPSULE | Freq: Once | ORAL | Status: AC
  Administered 2013-12-27: 25 mg via ORAL

## 2013-12-27 NOTE — Patient Instructions (Signed)

## 2013-12-27 NOTE — Telephone Encounter (Signed)
gv and printed appt sched and avs for tpf ro July adn Aug....sed added tx.

## 2013-12-27 NOTE — Progress Notes (Addendum)
[place] OFFICE PROGRESS NOTE   Diagnosis:  Metastatic lung cancer.  INTERVAL HISTORY:   [name] [name] returns as scheduled. He completed cycle 5 carboplatin/Alimta on 12/06/2013. Hemoglobin declined to a low of 5.4 and platelet count to 16,000 on 12/20/2013. He was transfused 2 units of blood.  He denies nausea/vomiting following the most recent chemotherapy. No mouth sores. No diarrhea or constipation. He has intermittent numbness/tingling in the hands. Appetite continues to be poor. He denies shortness of breath. He has a persistent cough. No fever. He denies leg swelling or calf pain. No bleeding. He reports increased back pain over the past week. The pain is present in the same area as prior. He is currently taking OxyContin 60 mg every 12 hours with oxycodone every 4 hours. He denies leg weakness or numbness. No bowel or bladder dysfunction.  Objective:  Vital signs in last 24 hours:  Blood pressure 101/67, pulse 124, temperature 98.5 F (36.9 C), temperature source Oral, resp. rate 18, height 5\' 11"  (1.803 m), weight 198 lb 11.2 oz (90.13 kg), SpO2 96.00%.    HEENT: No thrush or ulcerations. Resp: Bilateral rhonchi. No respiratory distress. Cardio: Heart is regular, tachycardic. GI: Abdomen soft. No hepatomegaly. Vascular: No leg edema. Neuro: Motor strength 5 over 5. Knee DTRs 1+, symmetric.   Port-A-Cath site without erythema.    Lab Results:  Lab Results  Component Value Date   WBC 3.9* 12/27/2013   HGB 7.5* 12/27/2013   HCT 23.4* 12/27/2013   MCV 91.1 12/27/2013   PLT 61* 12/27/2013   NEUTROABS 2.3 12/27/2013    Imaging:  No results found.  Medications: I have reviewed the patient's current medications.  Assessment/Plan:  1. Metastatic non-small cell lung cancer, adenocarcinoma on active treatment with carboplatin/Alimta. Restaging CT evaluation after 3 cycles showed improvement. He completed cycle 5 on 12/06/2013. 2. Back pain secondary to #1.  He is status post palliative radiation to the lumbar spine. Current pain medications include OxyContin 60 mg every 12 hours and oxycodone 10 mg every 4 hours as needed. 3. Anemia/thrombocytopenia secondary to chemotherapy.  Disposition: [name] [name] has completed 5 cycles of carboplatin/Alimta. Restaging CT evaluation after 3 cycles showed improvement.  He has persistent thrombocytopenia. Cycle 6 will be held today and rescheduled for one week. He will have restaging CT scans after completing cycle 6.  The hemoglobin is better following the recent blood transfusion but he continues to be anemic. He will receive 2 units of packed red cells today.  For pain he will continue OxyContin with oxycodone as needed. Dr. Julien Nordmann also recommended he try Tylenol.  [name] [name] will return for a followup visit on 01/24/2014. He will contact the office in the interim with any problems. We specifically discussed worsening back pain, extremity weakness/numbness, bowel/bladder dysfunction. He and his mother expressed their understanding.  Patient seen with Dr. Julien Nordmann.  Ned Card ANP/GNP-BC   12/27/2013  9:40 AM  ADDENDUM:  Hematology/Oncology attending:  I had a face to face encounter with the patient. I recommended his care plan. This is a very pleasant 48 years old white male with metastatic non-small cell lung cancer, adenocarcinoma and currently undergoing systemic chemotherapy with carboplatin and Alimta status post 5 cycles. The patient is tolerating his systemic chemotherapy fairly well with no significant adverse effect except for the persistent back pain. He will continue on his current pain medication with OxyContin and oxycodone. We will delay cycle #6 to next week because of the low platelets count. I  will also arrange for the patient to receive 2 units of PRBCs transfusion because of his chemotherapy-induced anemia. He would come back for followup visit in 4 weeks with repeat CT scan of the chest,  abdomen and pelvis for restaging of his disease. He was advised to call immediately if he has any concerning symptoms in the interval.  Disclaimer: This note was dictated with voice recognition software. Similar sounding words can inadvertently be transcribed and may not be corrected upon review. Eilleen Kempf., MD 12/28/2013

## 2013-12-27 NOTE — Patient Instructions (Signed)

## 2013-12-28 LAB — TYPE AND SCREEN
ABO/RH(D): O POS
ANTIBODY SCREEN: NEGATIVE
Unit division: 0
Unit division: 0

## 2013-12-29 ENCOUNTER — Other Ambulatory Visit: Payer: Self-pay | Admitting: *Deleted

## 2013-12-29 DIAGNOSIS — C342 Malignant neoplasm of middle lobe, bronchus or lung: Secondary | ICD-10-CM

## 2013-12-29 MED ORDER — OXYCODONE HCL 10 MG PO TABS: 10.0000 mg | ORAL_TABLET | ORAL | Status: DC | PRN

## 2014-01-03 ENCOUNTER — Ambulatory Visit (HOSPITAL_BASED_OUTPATIENT_CLINIC_OR_DEPARTMENT_OTHER): Payer: Medicare Other

## 2014-01-03 ENCOUNTER — Ambulatory Visit: Payer: Medicare Other | Admitting: Nutrition

## 2014-01-03 ENCOUNTER — Ambulatory Visit: Payer: Medicare Other

## 2014-01-03 ENCOUNTER — Other Ambulatory Visit (HOSPITAL_BASED_OUTPATIENT_CLINIC_OR_DEPARTMENT_OTHER): Payer: Medicare Other

## 2014-01-03 DIAGNOSIS — C342 Malignant neoplasm of middle lobe, bronchus or lung: Secondary | ICD-10-CM

## 2014-01-03 DIAGNOSIS — Z95828 Presence of other vascular implants and grafts: Secondary | ICD-10-CM

## 2014-01-03 DIAGNOSIS — C7952 Secondary malignant neoplasm of bone marrow: Secondary | ICD-10-CM

## 2014-01-03 DIAGNOSIS — C7951 Secondary malignant neoplasm of bone: Secondary | ICD-10-CM

## 2014-01-03 DIAGNOSIS — Z5111 Encounter for antineoplastic chemotherapy: Secondary | ICD-10-CM

## 2014-01-03 LAB — CBC WITH DIFFERENTIAL/PLATELET
BASO%: 0.9 % (ref 0.0–2.0)
BASOS ABS: 0.1 10*3/uL (ref 0.0–0.1)
EOS ABS: 0.2 10*3/uL (ref 0.0–0.5)
EOS%: 2.4 % (ref 0.0–7.0)
HCT: 28.5 % — ABNORMAL LOW (ref 38.4–49.9)
HGB: 9.3 g/dL — ABNORMAL LOW (ref 13.0–17.1)
LYMPH%: 9.1 % — ABNORMAL LOW (ref 14.0–49.0)
MCH: 30 pg (ref 27.2–33.4)
MCHC: 32.6 g/dL (ref 32.0–36.0)
MCV: 92.2 fL (ref 79.3–98.0)
MONO#: 0.7 10*3/uL (ref 0.1–0.9)
MONO%: 9.7 % (ref 0.0–14.0)
NEUT%: 77.9 % — ABNORMAL HIGH (ref 39.0–75.0)
NEUTROS ABS: 5.6 10*3/uL (ref 1.5–6.5)
PLATELETS: 148 10*3/uL (ref 140–400)
RBC: 3.09 10*6/uL — AB (ref 4.20–5.82)
RDW: 20.4 % — ABNORMAL HIGH (ref 11.0–14.6)
WBC: 7.2 10*3/uL (ref 4.0–10.3)
lymph#: 0.7 10*3/uL — ABNORMAL LOW (ref 0.9–3.3)

## 2014-01-03 LAB — COMPREHENSIVE METABOLIC PANEL (CC13)
ALK PHOS: 145 U/L (ref 40–150)
ALT: 9 U/L (ref 0–55)
AST: 11 U/L (ref 5–34)
Albumin: 2.6 g/dL — ABNORMAL LOW (ref 3.5–5.0)
Anion Gap: 9 mEq/L (ref 3–11)
BILIRUBIN TOTAL: 0.39 mg/dL (ref 0.20–1.20)
BUN: 5.7 mg/dL — AB (ref 7.0–26.0)
CO2: 26 mEq/L (ref 22–29)
Calcium: 7.7 mg/dL — ABNORMAL LOW (ref 8.4–10.4)
Chloride: 103 mEq/L (ref 98–109)
Creatinine: 0.6 mg/dL — ABNORMAL LOW (ref 0.7–1.3)
GLUCOSE: 97 mg/dL (ref 70–140)
Potassium: 3.8 mEq/L (ref 3.5–5.1)
Sodium: 138 mEq/L (ref 136–145)
Total Protein: 7.1 g/dL (ref 6.4–8.3)

## 2014-01-03 LAB — HOLD TUBE, BLOOD BANK

## 2014-01-03 MED ORDER — SODIUM CHLORIDE 0.9 % IV SOLN
400.0000 mg/m2 | Freq: Once | INTRAVENOUS | Status: AC
  Administered 2014-01-03: 825 mg via INTRAVENOUS
  Filled 2014-01-03: qty 33

## 2014-01-03 MED ORDER — HEPARIN SOD (PORK) LOCK FLUSH 100 UNIT/ML IV SOLN
500.0000 [IU] | Freq: Once | INTRAVENOUS | Status: AC | PRN
  Administered 2014-01-03: 500 [IU]
  Filled 2014-01-03: qty 5

## 2014-01-03 MED ORDER — DEXAMETHASONE SODIUM PHOSPHATE 20 MG/5ML IJ SOLN
20.0000 mg | Freq: Once | INTRAMUSCULAR | Status: AC
  Administered 2014-01-03: 20 mg via INTRAVENOUS

## 2014-01-03 MED ORDER — ONDANSETRON 16 MG/50ML IVPB (CHCC)
16.0000 mg | Freq: Once | INTRAVENOUS | Status: AC
  Administered 2014-01-03: 16 mg via INTRAVENOUS

## 2014-01-03 MED ORDER — SODIUM CHLORIDE 0.9 % IJ SOLN
10.0000 mL | INTRAMUSCULAR | Status: DC | PRN
  Administered 2014-01-03: 10 mL via INTRAVENOUS
  Filled 2014-01-03: qty 10

## 2014-01-03 MED ORDER — SODIUM CHLORIDE 0.9 % IV SOLN
Freq: Once | INTRAVENOUS | Status: AC
  Administered 2014-01-03: 14:00:00 via INTRAVENOUS

## 2014-01-03 MED ORDER — SODIUM CHLORIDE 0.9 % IJ SOLN
10.0000 mL | INTRAMUSCULAR | Status: DC | PRN
  Administered 2014-01-03: 10 mL
  Filled 2014-01-03: qty 10

## 2014-01-03 MED ORDER — SODIUM CHLORIDE 0.9 % IV SOLN
675.0000 mg | Freq: Once | INTRAVENOUS | Status: AC
  Administered 2014-01-03: 680 mg via INTRAVENOUS
  Filled 2014-01-03: qty 68

## 2014-01-03 MED ORDER — ONDANSETRON 16 MG/50ML IVPB (CHCC)
INTRAVENOUS | Status: AC
  Filled 2014-01-03: qty 16

## 2014-01-03 MED ORDER — CYANOCOBALAMIN 1000 MCG/ML IJ SOLN
1000.0000 ug | Freq: Once | INTRAMUSCULAR | Status: AC
  Administered 2014-01-03: 1000 ug via INTRAMUSCULAR

## 2014-01-03 MED ORDER — DEXAMETHASONE SODIUM PHOSPHATE 20 MG/5ML IJ SOLN
INTRAMUSCULAR | Status: AC
  Filled 2014-01-03: qty 5

## 2014-01-03 NOTE — Patient Instructions (Signed)
[facility] Discharge Instructions for Patients Receiving Chemotherapy  Today you received the following chemotherapy agents Alimta/Carboplatin  To help prevent nausea and vomiting after your treatment, we encourage you to take your nausea medication as directed.    If you develop nausea and vomiting that is not controlled by your nausea medication, call the clinic.   BELOW ARE SYMPTOMS THAT SHOULD BE REPORTED IMMEDIATELY:  *FEVER GREATER THAN 100.5 F  *CHILLS WITH OR WITHOUT FEVER  NAUSEA AND VOMITING THAT IS NOT CONTROLLED WITH YOUR NAUSEA MEDICATION  *UNUSUAL SHORTNESS OF BREATH  *UNUSUAL BRUISING OR BLEEDING  TENDERNESS IN MOUTH AND THROAT WITH OR WITHOUT PRESENCE OF ULCERS  *URINARY PROBLEMS  *BOWEL PROBLEMS  UNUSUAL RASH Items with * indicate a potential emergency and should be followed up as soon as possible.  Feel free to call the clinic you have any questions or concerns. The clinic phone number is [phone].

## 2014-01-03 NOTE — Patient Instructions (Signed)

## 2014-01-03 NOTE — Progress Notes (Signed)
48 year old male diagnosed with metastatic non-small cell lung cancer.  He is a patient of Dr. Earlie Server.  Past medical history includes depression, hyperlipidemia, GERD, bipolar disorder, pneumonia, anemia, and radiation therapy.  Medications include Os-Cal, Colace, Marinol, Folvite, Compazine, and Seroquel.  Labs include BUN of 6.0, creatinine 0.6, albumin 2.5 on July 21.  Height: 5 feet 11 inches. Weight: 198.7 pounds. Usual body weight: 206 pounds June 30. BMI: 27.73.  Patient was identified to be at risk for malnutrition on the malnutrition risk screen.   Patient seen in the chemotherapy area.  Patient denies nutrition side effects other than poor appetite.  He reports some days he doesn't eat much of anything.  Patient has had 7 pound weight loss in a month.  States he weighed 228 pounds in May 2015.  Patient reports he will drink special K protein drinks.  He does not like ensure or boost.  Nutrition diagnosis: Unintended weight loss related to poor appetite as evidenced by 13% weight loss from usual body weight over 2 months.  Intervention: Patient was educated to try [employer] essentials made with fortified milk to provide 350 calories and 20 g protein per 8 ounce glass.  Encouraged patient to consume these 3 times a day.  Recommended small, frequent meals and snacks with high-protein foods to promote weight maintenance.  Provided fact sheets on poor appetite, and increasing calories and protein.  Questions answered.  Teach back method used.  Contact information was given.  Monitoring, evaluation, goals: Patient will tolerate increased calories and protein to promote weight stabilization.  Next visit: Tuesday, August 18, during chemotherapy.  **Disclaimer: This note was dictated with voice recognition software. Similar sounding words can inadvertently be transcribed and this note may contain transcription errors which may not have been corrected upon publication of  note.**

## 2014-01-09 ENCOUNTER — Other Ambulatory Visit: Payer: Self-pay | Admitting: Medical Oncology

## 2014-01-09 DIAGNOSIS — C342 Malignant neoplasm of middle lobe, bronchus or lung: Secondary | ICD-10-CM

## 2014-01-09 MED ORDER — OXYCODONE HCL 10 MG PO TABS: 10.0000 mg | ORAL_TABLET | ORAL | Status: DC | PRN

## 2014-01-09 NOTE — Telephone Encounter (Signed)
Rx locked in injection room.

## 2014-01-10 ENCOUNTER — Other Ambulatory Visit: Payer: Self-pay | Admitting: Internal Medicine

## 2014-01-10 ENCOUNTER — Ambulatory Visit (HOSPITAL_BASED_OUTPATIENT_CLINIC_OR_DEPARTMENT_OTHER): Payer: Medicare Other

## 2014-01-10 ENCOUNTER — Other Ambulatory Visit (HOSPITAL_BASED_OUTPATIENT_CLINIC_OR_DEPARTMENT_OTHER): Payer: Medicare Other

## 2014-01-10 VITALS — BP 103/68 | HR 113 | Temp 97.7°F

## 2014-01-10 DIAGNOSIS — C7949 Secondary malignant neoplasm of other parts of nervous system: Principal | ICD-10-CM

## 2014-01-10 DIAGNOSIS — C342 Malignant neoplasm of middle lobe, bronchus or lung: Secondary | ICD-10-CM

## 2014-01-10 DIAGNOSIS — C7931 Secondary malignant neoplasm of brain: Secondary | ICD-10-CM

## 2014-01-10 DIAGNOSIS — C3490 Malignant neoplasm of unspecified part of unspecified bronchus or lung: Secondary | ICD-10-CM

## 2014-01-10 DIAGNOSIS — Z95828 Presence of other vascular implants and grafts: Secondary | ICD-10-CM

## 2014-01-10 DIAGNOSIS — Z452 Encounter for adjustment and management of vascular access device: Secondary | ICD-10-CM

## 2014-01-10 LAB — CBC WITH DIFFERENTIAL/PLATELET
BASO%: 0.7 % (ref 0.0–2.0)
Basophils Absolute: 0 10*3/uL (ref 0.0–0.1)
EOS%: 3.7 % (ref 0.0–7.0)
Eosinophils Absolute: 0.1 10*3/uL (ref 0.0–0.5)
HCT: 24.7 % — ABNORMAL LOW (ref 38.4–49.9)
HGB: 8.2 g/dL — ABNORMAL LOW (ref 13.0–17.1)
LYMPH%: 14.5 % (ref 14.0–49.0)
MCH: 30 pg (ref 27.2–33.4)
MCHC: 33.1 g/dL (ref 32.0–36.0)
MCV: 90.4 fL (ref 79.3–98.0)
MONO#: 0.2 10*3/uL (ref 0.1–0.9)
MONO%: 7.1 % (ref 0.0–14.0)
NEUT#: 2.2 10*3/uL (ref 1.5–6.5)
NEUT%: 74 % (ref 39.0–75.0)
Platelets: 87 10*3/uL — ABNORMAL LOW (ref 140–400)
RBC: 2.74 10*6/uL — AB (ref 4.20–5.82)
RDW: 19.3 % — AB (ref 11.0–14.6)
WBC: 3 10*3/uL — ABNORMAL LOW (ref 4.0–10.3)
lymph#: 0.4 10*3/uL — ABNORMAL LOW (ref 0.9–3.3)

## 2014-01-10 LAB — COMPREHENSIVE METABOLIC PANEL (CC13)
ALBUMIN: 2.9 g/dL — AB (ref 3.5–5.0)
ALK PHOS: 129 U/L (ref 40–150)
ALT: 17 U/L (ref 0–55)
AST: 16 U/L (ref 5–34)
Anion Gap: 11 mEq/L (ref 3–11)
BILIRUBIN TOTAL: 0.39 mg/dL (ref 0.20–1.20)
BUN: 12.9 mg/dL (ref 7.0–26.0)
CO2: 24 mEq/L (ref 22–29)
Calcium: 7.9 mg/dL — ABNORMAL LOW (ref 8.4–10.4)
Chloride: 101 mEq/L (ref 98–109)
Creatinine: 0.7 mg/dL (ref 0.7–1.3)
Glucose: 110 mg/dl (ref 70–140)
POTASSIUM: 3.9 meq/L (ref 3.5–5.1)
SODIUM: 136 meq/L (ref 136–145)
TOTAL PROTEIN: 7.1 g/dL (ref 6.4–8.3)

## 2014-01-10 MED ORDER — SODIUM CHLORIDE 0.9 % IJ SOLN
10.0000 mL | INTRAMUSCULAR | Status: DC | PRN
  Administered 2014-01-10: 10 mL via INTRAVENOUS
  Filled 2014-01-10: qty 10

## 2014-01-10 MED ORDER — HEPARIN SOD (PORK) LOCK FLUSH 100 UNIT/ML IV SOLN
500.0000 [IU] | Freq: Once | INTRAVENOUS | Status: AC
  Administered 2014-01-10: 500 [IU] via INTRAVENOUS
  Filled 2014-01-10: qty 5

## 2014-01-10 NOTE — Patient Instructions (Signed)

## 2014-01-11 ENCOUNTER — Other Ambulatory Visit: Payer: Self-pay | Admitting: *Deleted

## 2014-01-11 DIAGNOSIS — C7949 Secondary malignant neoplasm of other parts of nervous system: Secondary | ICD-10-CM

## 2014-01-16 ENCOUNTER — Other Ambulatory Visit: Payer: Self-pay | Admitting: Internal Medicine

## 2014-01-17 ENCOUNTER — Encounter (HOSPITAL_COMMUNITY): Payer: Self-pay

## 2014-01-17 ENCOUNTER — Ambulatory Visit (HOSPITAL_COMMUNITY)
Admission: RE | Admit: 2014-01-17 | Discharge: 2014-01-17 | Disposition: A | Discharge status: Home / Self Care | Payer: Medicare Other | Source: Ambulatory Visit | Attending: Nurse Practitioner | Admitting: Nurse Practitioner

## 2014-01-17 ENCOUNTER — Ambulatory Visit (HOSPITAL_BASED_OUTPATIENT_CLINIC_OR_DEPARTMENT_OTHER): Payer: Medicare Other

## 2014-01-17 ENCOUNTER — Telehealth: Payer: Self-pay | Admitting: Medical Oncology

## 2014-01-17 ENCOUNTER — Other Ambulatory Visit: Payer: Self-pay | Admitting: Medical Oncology

## 2014-01-17 ENCOUNTER — Other Ambulatory Visit (HOSPITAL_BASED_OUTPATIENT_CLINIC_OR_DEPARTMENT_OTHER): Payer: Medicare Other

## 2014-01-17 ENCOUNTER — Ambulatory Visit (HOSPITAL_COMMUNITY)
Admission: RE | Admit: 2014-01-17 | Discharge: 2014-01-17 | Disposition: A | Discharge status: Home / Self Care | Payer: Medicare Other | Source: Ambulatory Visit | Attending: Internal Medicine | Admitting: Internal Medicine

## 2014-01-17 VITALS — BP 103/58 | HR 100 | Temp 98.2°F

## 2014-01-17 VITALS — BP 100/68 | HR 103 | Temp 98.4°F | Resp 18

## 2014-01-17 DIAGNOSIS — R091 Pleurisy: Secondary | ICD-10-CM | POA: Diagnosis not present

## 2014-01-17 DIAGNOSIS — I7090 Unspecified atherosclerosis: Secondary | ICD-10-CM | POA: Insufficient documentation

## 2014-01-17 DIAGNOSIS — C7951 Secondary malignant neoplasm of bone: Secondary | ICD-10-CM

## 2014-01-17 DIAGNOSIS — C3490 Malignant neoplasm of unspecified part of unspecified bronchus or lung: Secondary | ICD-10-CM | POA: Insufficient documentation

## 2014-01-17 DIAGNOSIS — D6481 Anemia due to antineoplastic chemotherapy: Secondary | ICD-10-CM

## 2014-01-17 DIAGNOSIS — Z9221 Personal history of antineoplastic chemotherapy: Secondary | ICD-10-CM | POA: Diagnosis not present

## 2014-01-17 DIAGNOSIS — C342 Malignant neoplasm of middle lobe, bronchus or lung: Secondary | ICD-10-CM

## 2014-01-17 DIAGNOSIS — D61810 Antineoplastic chemotherapy induced pancytopenia: Secondary | ICD-10-CM | POA: Diagnosis present

## 2014-01-17 DIAGNOSIS — T451X5A Adverse effect of antineoplastic and immunosuppressive drugs, initial encounter: Secondary | ICD-10-CM | POA: Insufficient documentation

## 2014-01-17 DIAGNOSIS — J9819 Other pulmonary collapse: Secondary | ICD-10-CM | POA: Diagnosis not present

## 2014-01-17 DIAGNOSIS — C7949 Secondary malignant neoplasm of other parts of nervous system: Secondary | ICD-10-CM

## 2014-01-17 DIAGNOSIS — C7952 Secondary malignant neoplasm of bone marrow: Secondary | ICD-10-CM | POA: Diagnosis not present

## 2014-01-17 DIAGNOSIS — D649 Anemia, unspecified: Secondary | ICD-10-CM

## 2014-01-17 DIAGNOSIS — Z95828 Presence of other vascular implants and grafts: Secondary | ICD-10-CM

## 2014-01-17 LAB — COMPREHENSIVE METABOLIC PANEL
ALT: 17 U/L (ref 0–53)
AST: 16 U/L (ref 0–37)
Albumin: 3 g/dL — ABNORMAL LOW (ref 3.5–5.2)
Alkaline Phosphatase: 138 U/L — ABNORMAL HIGH (ref 39–117)
BILIRUBIN TOTAL: 0.4 mg/dL (ref 0.2–1.2)
BUN: 11 mg/dL (ref 6–23)
CALCIUM: 6.4 mg/dL — AB (ref 8.4–10.5)
CO2: 26 meq/L (ref 19–32)
Chloride: 97 mEq/L (ref 96–112)
Creatinine, Ser: 0.53 mg/dL (ref 0.50–1.35)
Glucose, Bld: 103 mg/dL — ABNORMAL HIGH (ref 70–99)
Potassium: 3.8 mEq/L (ref 3.5–5.3)
SODIUM: 137 meq/L (ref 135–145)
TOTAL PROTEIN: 7.1 g/dL (ref 6.0–8.3)

## 2014-01-17 LAB — CBC WITH DIFFERENTIAL/PLATELET
BASO%: 0.4 % (ref 0.0–2.0)
Basophils Absolute: 0 10*3/uL (ref 0.0–0.1)
EOS ABS: 0.1 10*3/uL (ref 0.0–0.5)
EOS%: 2.1 % (ref 0.0–7.0)
HEMATOCRIT: 18.3 % — AB (ref 38.4–49.9)
HGB: 6.2 g/dL — CL (ref 13.0–17.1)
LYMPH%: 16.5 % (ref 14.0–49.0)
MCH: 30.7 pg (ref 27.2–33.4)
MCHC: 33.9 g/dL (ref 32.0–36.0)
MCV: 90.6 fL (ref 79.3–98.0)
MONO#: 0.3 10*3/uL (ref 0.1–0.9)
MONO%: 10 % (ref 0.0–14.0)
NEUT%: 71 % (ref 39.0–75.0)
NEUTROS ABS: 2.3 10*3/uL (ref 1.5–6.5)
PLATELETS: 18 10*3/uL — AB (ref 140–400)
RBC: 2.02 10*6/uL — AB (ref 4.20–5.82)
RDW: 18.6 % — ABNORMAL HIGH (ref 11.0–14.6)
WBC: 3.3 10*3/uL — AB (ref 4.0–10.3)
lymph#: 0.5 10*3/uL — ABNORMAL LOW (ref 0.9–3.3)

## 2014-01-17 LAB — PREPARE RBC (CROSSMATCH)

## 2014-01-17 LAB — HOLD TUBE, BLOOD BANK

## 2014-01-17 MED ORDER — SODIUM CHLORIDE 0.9 % IJ SOLN
10.0000 mL | INTRAMUSCULAR | Status: DC | PRN
  Administered 2014-01-17: 10 mL via INTRAVENOUS
  Filled 2014-01-17: qty 10

## 2014-01-17 MED ORDER — DIPHENHYDRAMINE HCL 25 MG PO CAPS
25.0000 mg | ORAL_CAPSULE | Freq: Once | ORAL | Status: AC
  Administered 2014-01-17: 25 mg via ORAL

## 2014-01-17 MED ORDER — SODIUM CHLORIDE 0.9 % IV SOLN
250.0000 mL | Freq: Once | INTRAVENOUS | Status: AC
  Administered 2014-01-17: 250 mL via INTRAVENOUS

## 2014-01-17 MED ORDER — DIPHENHYDRAMINE HCL 25 MG PO CAPS
ORAL_CAPSULE | ORAL | Status: AC
  Filled 2014-01-17: qty 1

## 2014-01-17 MED ORDER — HEPARIN SOD (PORK) LOCK FLUSH 100 UNIT/ML IV SOLN
500.0000 [IU] | Freq: Every day | INTRAVENOUS | Status: AC | PRN
  Administered 2014-01-17: 500 [IU]
  Filled 2014-01-17: qty 5

## 2014-01-17 MED ORDER — ACETAMINOPHEN 325 MG PO TABS
650.0000 mg | ORAL_TABLET | Freq: Once | ORAL | Status: AC
  Administered 2014-01-17: 650 mg via ORAL

## 2014-01-17 MED ORDER — SODIUM CHLORIDE 0.9 % IJ SOLN
10.0000 mL | INTRAMUSCULAR | Status: AC | PRN
  Administered 2014-01-17: 10 mL
  Filled 2014-01-17: qty 10

## 2014-01-17 MED ORDER — HEPARIN SOD (PORK) LOCK FLUSH 100 UNIT/ML IV SOLN
500.0000 [IU] | Freq: Once | INTRAVENOUS | Status: AC
  Administered 2014-01-17: 500 [IU] via INTRAVENOUS
  Filled 2014-01-17: qty 5

## 2014-01-17 MED ORDER — IOHEXOL 300 MG/ML  SOLN
100.0000 mL | Freq: Once | INTRAMUSCULAR | Status: AC | PRN
  Administered 2014-01-17: 100 mL via INTRAVENOUS

## 2014-01-17 MED ORDER — ACETAMINOPHEN 325 MG PO TABS
ORAL_TABLET | ORAL | Status: AC
  Filled 2014-01-17: qty 2

## 2014-01-17 NOTE — Progress Notes (Signed)
Pt denies any bleeding .

## 2014-01-17 NOTE — Progress Notes (Signed)
Reviewed blood precautions with patient and mother; call office if minimal bleeding and told to call report to ER if bleeding is severe; verbalized understanding.

## 2014-01-17 NOTE — Patient Instructions (Signed)

## 2014-01-17 NOTE — Progress Notes (Signed)
HAR done

## 2014-01-17 NOTE — Telephone Encounter (Signed)
Pt instructed to return to cancer center after scan.

## 2014-01-17 NOTE — Patient Instructions (Signed)

## 2014-01-18 ENCOUNTER — Ambulatory Visit (HOSPITAL_BASED_OUTPATIENT_CLINIC_OR_DEPARTMENT_OTHER): Payer: Medicare Other

## 2014-01-18 VITALS — BP 103/75 | HR 96 | Temp 97.7°F | Resp 17

## 2014-01-18 DIAGNOSIS — D649 Anemia, unspecified: Secondary | ICD-10-CM

## 2014-01-18 DIAGNOSIS — T451X5A Adverse effect of antineoplastic and immunosuppressive drugs, initial encounter: Secondary | ICD-10-CM

## 2014-01-18 DIAGNOSIS — D61810 Antineoplastic chemotherapy induced pancytopenia: Secondary | ICD-10-CM | POA: Diagnosis not present

## 2014-01-18 DIAGNOSIS — D6481 Anemia due to antineoplastic chemotherapy: Secondary | ICD-10-CM

## 2014-01-18 MED ORDER — SODIUM CHLORIDE 0.9 % IV SOLN
250.0000 mL | Freq: Once | INTRAVENOUS | Status: AC
  Administered 2014-01-18: 250 mL via INTRAVENOUS

## 2014-01-18 MED ORDER — DIPHENHYDRAMINE HCL 25 MG PO CAPS
ORAL_CAPSULE | ORAL | Status: AC
  Filled 2014-01-18: qty 1

## 2014-01-18 MED ORDER — SODIUM CHLORIDE 0.9 % IJ SOLN
10.0000 mL | INTRAMUSCULAR | Status: AC | PRN
  Administered 2014-01-18: 10 mL
  Filled 2014-01-18: qty 10

## 2014-01-18 MED ORDER — DIPHENHYDRAMINE HCL 25 MG PO CAPS
25.0000 mg | ORAL_CAPSULE | Freq: Once | ORAL | Status: AC
  Administered 2014-01-18: 25 mg via ORAL

## 2014-01-18 MED ORDER — ACETAMINOPHEN 325 MG PO TABS
ORAL_TABLET | ORAL | Status: AC
  Filled 2014-01-18: qty 2

## 2014-01-18 MED ORDER — ACETAMINOPHEN 325 MG PO TABS
650.0000 mg | ORAL_TABLET | Freq: Once | ORAL | Status: AC
  Administered 2014-01-18: 650 mg via ORAL

## 2014-01-18 MED ORDER — HEPARIN SOD (PORK) LOCK FLUSH 100 UNIT/ML IV SOLN
500.0000 [IU] | Freq: Every day | INTRAVENOUS | Status: AC | PRN
  Administered 2014-01-18: 500 [IU]
  Filled 2014-01-18: qty 5

## 2014-01-18 NOTE — Patient Instructions (Signed)

## 2014-01-19 ENCOUNTER — Other Ambulatory Visit: Payer: Self-pay | Admitting: *Deleted

## 2014-01-19 ENCOUNTER — Telehealth: Payer: Self-pay | Admitting: Nurse Practitioner

## 2014-01-19 DIAGNOSIS — C342 Malignant neoplasm of middle lobe, bronchus or lung: Secondary | ICD-10-CM

## 2014-01-19 LAB — TYPE AND SCREEN
ABO/RH(D): O POS
ANTIBODY SCREEN: NEGATIVE
UNIT DIVISION: 0
Unit division: 0

## 2014-01-19 MED ORDER — OXYCODONE HCL 10 MG PO TABS: 10.0000 mg | ORAL_TABLET | ORAL | Status: DC | PRN

## 2014-01-19 NOTE — Telephone Encounter (Signed)
Prescription refilled per Murvin Donning. rx locked in injection room.

## 2014-01-19 NOTE — Telephone Encounter (Signed)
RN called patient's mother, Salomon Ganser, to notify her refill request on behalf of the patient will be ready for pick up from pharmacy tomorrow, 01/20/14. She verbalizes understanding.

## 2014-01-20 ENCOUNTER — Ambulatory Visit (HOSPITAL_COMMUNITY): Payer: Medicare Other

## 2014-01-24 ENCOUNTER — Ambulatory Visit (HOSPITAL_BASED_OUTPATIENT_CLINIC_OR_DEPARTMENT_OTHER): Payer: Medicare Other | Admitting: Internal Medicine

## 2014-01-24 ENCOUNTER — Other Ambulatory Visit (HOSPITAL_BASED_OUTPATIENT_CLINIC_OR_DEPARTMENT_OTHER): Payer: Medicare Other

## 2014-01-24 ENCOUNTER — Encounter: Payer: Self-pay | Admitting: Internal Medicine

## 2014-01-24 ENCOUNTER — Ambulatory Visit (HOSPITAL_BASED_OUTPATIENT_CLINIC_OR_DEPARTMENT_OTHER): Payer: Medicare Other

## 2014-01-24 ENCOUNTER — Telehealth: Payer: Self-pay | Admitting: Internal Medicine

## 2014-01-24 ENCOUNTER — Ambulatory Visit: Payer: Self-pay

## 2014-01-24 ENCOUNTER — Encounter: Payer: Self-pay | Admitting: Nutrition

## 2014-01-24 VITALS — BP 105/62 | HR 118 | Temp 97.5°F | Resp 18 | Ht 71.0 in | Wt 200.9 lb

## 2014-01-24 DIAGNOSIS — C7949 Secondary malignant neoplasm of other parts of nervous system: Secondary | ICD-10-CM

## 2014-01-24 DIAGNOSIS — C7951 Secondary malignant neoplasm of bone: Secondary | ICD-10-CM

## 2014-01-24 DIAGNOSIS — C7952 Secondary malignant neoplasm of bone marrow: Secondary | ICD-10-CM

## 2014-01-24 DIAGNOSIS — C342 Malignant neoplasm of middle lobe, bronchus or lung: Secondary | ICD-10-CM

## 2014-01-24 DIAGNOSIS — Z95828 Presence of other vascular implants and grafts: Secondary | ICD-10-CM

## 2014-01-24 DIAGNOSIS — Z452 Encounter for adjustment and management of vascular access device: Secondary | ICD-10-CM

## 2014-01-24 LAB — CBC WITH DIFFERENTIAL/PLATELET
BASO%: 0.5 % (ref 0.0–2.0)
Basophils Absolute: 0 10*3/uL (ref 0.0–0.1)
EOS%: 1.9 % (ref 0.0–7.0)
Eosinophils Absolute: 0.1 10*3/uL (ref 0.0–0.5)
HCT: 24.6 % — ABNORMAL LOW (ref 38.4–49.9)
HGB: 8.3 g/dL — ABNORMAL LOW (ref 13.0–17.1)
LYMPH%: 17.4 % (ref 14.0–49.0)
MCH: 31.4 pg (ref 27.2–33.4)
MCHC: 33.7 g/dL (ref 32.0–36.0)
MCV: 93.3 fL (ref 79.3–98.0)
MONO#: 0.5 10*3/uL (ref 0.1–0.9)
MONO%: 14.2 % — AB (ref 0.0–14.0)
NEUT#: 2.5 10*3/uL (ref 1.5–6.5)
NEUT%: 66 % (ref 39.0–75.0)
PLATELETS: 81 10*3/uL — AB (ref 140–400)
RBC: 2.63 10*6/uL — ABNORMAL LOW (ref 4.20–5.82)
RDW: 19.6 % — ABNORMAL HIGH (ref 11.0–14.6)
WBC: 3.9 10*3/uL — ABNORMAL LOW (ref 4.0–10.3)
lymph#: 0.7 10*3/uL — ABNORMAL LOW (ref 0.9–3.3)

## 2014-01-24 LAB — HOLD TUBE, BLOOD BANK

## 2014-01-24 MED ORDER — SODIUM CHLORIDE 0.9 % IJ SOLN
10.0000 mL | INTRAMUSCULAR | Status: DC | PRN
  Administered 2014-01-24: 10 mL via INTRAVENOUS
  Filled 2014-01-24: qty 10

## 2014-01-24 MED ORDER — OXYCODONE HCL ER 60 MG PO T12A: 1.0000 | EXTENDED_RELEASE_TABLET | Freq: Two times a day (BID) | ORAL | Status: DC

## 2014-01-24 MED ORDER — HEPARIN SOD (PORK) LOCK FLUSH 100 UNIT/ML IV SOLN
500.0000 [IU] | Freq: Once | INTRAVENOUS | Status: AC
  Administered 2014-01-24: 500 [IU] via INTRAVENOUS
  Filled 2014-01-24: qty 5

## 2014-01-24 NOTE — Telephone Encounter (Signed)
gv and printed appt sched and avs for pt for Sept....sed added tx.
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
## 2014-01-24 NOTE — Patient Instructions (Signed)

## 2014-01-24 NOTE — Patient Instructions (Signed)
Smoking Cessation, Tips for Success  If you are ready to quit smoking, congratulations! You have chosen to help yourself be healthier. Cigarettes bring nicotine, tar, carbon monoxide, and other irritants into your body. Your lungs, heart, and blood vessels will be able to work better without these poisons. There are many different ways to quit smoking. Nicotine gum, nicotine patches, a nicotine inhaler, or nicotine nasal spray can help with physical craving. Hypnosis, support groups, and medicines help break the habit of smoking.  WHAT THINGS CAN I DO TO MAKE QUITTING EASIER?   Here are some tips to help you quit for good:  · Pick a date when you will quit smoking completely. Tell all of your friends and family about your plan to quit on that date.  · Do not try to slowly cut down on the number of cigarettes you are smoking. Pick a quit date and quit smoking completely starting on that day.  · Throw away all cigarettes.    · Clean and remove all ashtrays from your home, work, and car.  · On a card, write down your reasons for quitting. Carry the card with you and read it when you get the urge to smoke.  · Cleanse your body of nicotine. Drink enough water and fluids to keep your urine clear or pale yellow. Do this after quitting to flush the nicotine from your body.  · Learn to predict your moods. Do not let a bad situation be your excuse to have a cigarette. Some situations in your life might tempt you into wanting a cigarette.  · Never have "just one" cigarette. It leads to wanting another and another. Remind yourself of your decision to quit.  · Change habits associated with smoking. If you smoked while driving or when feeling stressed, try other activities to replace smoking. Stand up when drinking your coffee. Brush your teeth after eating. Sit in a different chair when you read the paper. Avoid alcohol while trying to quit, and try to drink fewer caffeinated beverages. Alcohol and caffeine may urge you to  smoke.  · Avoid foods and drinks that can trigger a desire to smoke, such as sugary or spicy foods and alcohol.  · Ask people who smoke not to smoke around you.  · Have something planned to do right after eating or having a cup of coffee. For example, plan to take a walk or exercise.  · Try a relaxation exercise to calm you down and decrease your stress. Remember, you may be tense and nervous for the first 2 weeks after you quit, but this will pass.  · Find new activities to keep your hands busy. Play with a pen, coin, or rubber band. Doodle or draw things on paper.  · Brush your teeth right after eating. This will help cut down on the craving for the taste of tobacco after meals. You can also try mouthwash.    · Use oral substitutes in place of cigarettes. Try using lemon drops, carrots, cinnamon sticks, or chewing gum. Keep them handy so they are available when you have the urge to smoke.  · When you have the urge to smoke, try deep breathing.  · Designate your home as a nonsmoking area.  · If you are a heavy smoker, ask your health care provider about a prescription for nicotine chewing gum. It can ease your withdrawal from nicotine.  · Reward yourself. Set aside the cigarette money you save and buy yourself something nice.  · Look for   support from others. Join a support group or smoking cessation program. Ask someone at home or at work to help you with your plan to quit smoking.  · Always ask yourself, "Do I need this cigarette or is this just a reflex?" Tell yourself, "Today, I choose not to smoke," or "I do not want to smoke." You are reminding yourself of your decision to quit.  · Do not replace cigarette smoking with electronic cigarettes (commonly called e-cigarettes). The safety of e-cigarettes is unknown, and some may contain harmful chemicals.  · If you relapse, do not give up! Plan ahead and think about what you will do the next time you get the urge to smoke.  HOW WILL I FEEL WHEN I QUIT SMOKING?  You  may have symptoms of withdrawal because your body is used to nicotine (the addictive substance in cigarettes). You may crave cigarettes, be irritable, feel very hungry, cough often, get headaches, or have difficulty concentrating. The withdrawal symptoms are only temporary. They are strongest when you first quit but will go away within 10-14 days. When withdrawal symptoms occur, stay in control. Think about your reasons for quitting. Remind yourself that these are signs that your body is healing and getting used to being without cigarettes. Remember that withdrawal symptoms are easier to treat than the major diseases that smoking can cause.   Even after the withdrawal is over, expect periodic urges to smoke. However, these cravings are generally short lived and will go away whether you smoke or not. Do not smoke!  WHAT RESOURCES ARE AVAILABLE TO HELP ME QUIT SMOKING?  Your health care provider can direct you to community resources or hospitals for support, which may include:  · Group support.  · Education.  · Hypnosis.  · Therapy.  Document Released: 02/22/2004 Document Revised: 10/10/2013 Document Reviewed: 11/11/2012  [employer]® Patient Information ©2015 [employer]. This information is not intended to replace advice given to you by your health care provider. Make sure you discuss any questions you have with your health care provider.

## 2014-01-31 ENCOUNTER — Other Ambulatory Visit: Payer: Self-pay | Admitting: *Deleted

## 2014-01-31 DIAGNOSIS — C342 Malignant neoplasm of middle lobe, bronchus or lung: Secondary | ICD-10-CM

## 2014-01-31 MED ORDER — OXYCODONE HCL 10 MG PO TABS: 10.0000 mg | ORAL_TABLET | ORAL | Status: DC | PRN

## 2014-02-09 ENCOUNTER — Other Ambulatory Visit: Payer: Self-pay | Admitting: Medical Oncology

## 2014-02-09 ENCOUNTER — Telehealth: Payer: Self-pay | Admitting: Medical Oncology

## 2014-02-09 DIAGNOSIS — C342 Malignant neoplasm of middle lobe, bronchus or lung: Secondary | ICD-10-CM

## 2014-02-09 MED ORDER — OXYCODONE HCL 10 MG PO TABS: 10.0000 mg | ORAL_TABLET | ORAL | Status: DC | PRN

## 2014-02-14 ENCOUNTER — Ambulatory Visit: Payer: Self-pay | Admitting: Internal Medicine

## 2014-02-14 ENCOUNTER — Ambulatory Visit (HOSPITAL_BASED_OUTPATIENT_CLINIC_OR_DEPARTMENT_OTHER): Payer: Medicare Other

## 2014-02-14 ENCOUNTER — Other Ambulatory Visit (HOSPITAL_BASED_OUTPATIENT_CLINIC_OR_DEPARTMENT_OTHER): Payer: Medicare Other

## 2014-02-14 ENCOUNTER — Ambulatory Visit (HOSPITAL_BASED_OUTPATIENT_CLINIC_OR_DEPARTMENT_OTHER): Payer: Medicare Other | Admitting: Physician Assistant

## 2014-02-14 ENCOUNTER — Ambulatory Visit: Payer: Medicare Other

## 2014-02-14 ENCOUNTER — Telehealth: Payer: Self-pay | Admitting: Physician Assistant

## 2014-02-14 ENCOUNTER — Encounter: Payer: Self-pay | Admitting: Physician Assistant

## 2014-02-14 VITALS — BP 111/65 | HR 108 | Temp 97.0°F | Resp 17 | Ht 71.0 in | Wt 204.4 lb

## 2014-02-14 DIAGNOSIS — C7951 Secondary malignant neoplasm of bone: Secondary | ICD-10-CM

## 2014-02-14 DIAGNOSIS — Z95828 Presence of other vascular implants and grafts: Secondary | ICD-10-CM

## 2014-02-14 DIAGNOSIS — G893 Neoplasm related pain (acute) (chronic): Secondary | ICD-10-CM

## 2014-02-14 DIAGNOSIS — B370 Candidal stomatitis: Secondary | ICD-10-CM

## 2014-02-14 DIAGNOSIS — C7952 Secondary malignant neoplasm of bone marrow: Secondary | ICD-10-CM

## 2014-02-14 DIAGNOSIS — C342 Malignant neoplasm of middle lobe, bronchus or lung: Secondary | ICD-10-CM

## 2014-02-14 DIAGNOSIS — Z5111 Encounter for antineoplastic chemotherapy: Secondary | ICD-10-CM

## 2014-02-14 LAB — CBC WITH DIFFERENTIAL/PLATELET
BASO%: 0.9 % (ref 0.0–2.0)
BASOS ABS: 0.1 10*3/uL (ref 0.0–0.1)
EOS ABS: 0.2 10*3/uL (ref 0.0–0.5)
EOS%: 2.4 % (ref 0.0–7.0)
HCT: 27.6 % — ABNORMAL LOW (ref 38.4–49.9)
HEMOGLOBIN: 8.9 g/dL — AB (ref 13.0–17.1)
LYMPH%: 9.4 % — AB (ref 14.0–49.0)
MCH: 32 pg (ref 27.2–33.4)
MCHC: 32.2 g/dL (ref 32.0–36.0)
MCV: 99.3 fL — AB (ref 79.3–98.0)
MONO#: 0.7 10*3/uL (ref 0.1–0.9)
MONO%: 8.3 % (ref 0.0–14.0)
NEUT%: 79 % — AB (ref 39.0–75.0)
NEUTROS ABS: 6.5 10*3/uL (ref 1.5–6.5)
PLATELETS: 168 10*3/uL (ref 140–400)
RBC: 2.78 10*6/uL — ABNORMAL LOW (ref 4.20–5.82)
RDW: 21.7 % — ABNORMAL HIGH (ref 11.0–14.6)
WBC: 8.2 10*3/uL (ref 4.0–10.3)
lymph#: 0.8 10*3/uL — ABNORMAL LOW (ref 0.9–3.3)

## 2014-02-14 LAB — COMPREHENSIVE METABOLIC PANEL (CC13)
ALK PHOS: 118 U/L (ref 40–150)
ALT: 8 U/L (ref 0–55)
AST: 12 U/L (ref 5–34)
Albumin: 3.2 g/dL — ABNORMAL LOW (ref 3.5–5.0)
Anion Gap: 11 mEq/L (ref 3–11)
BILIRUBIN TOTAL: 0.39 mg/dL (ref 0.20–1.20)
BUN: 10.1 mg/dL (ref 7.0–26.0)
CO2: 24 mEq/L (ref 22–29)
CREATININE: 0.7 mg/dL (ref 0.7–1.3)
Calcium: 9.1 mg/dL (ref 8.4–10.4)
Chloride: 104 mEq/L (ref 98–109)
GLUCOSE: 110 mg/dL (ref 70–140)
Potassium: 3.9 mEq/L (ref 3.5–5.1)
Sodium: 138 mEq/L (ref 136–145)
Total Protein: 7.7 g/dL (ref 6.4–8.3)

## 2014-02-14 LAB — TECHNOLOGIST REVIEW: Technologist Review: 1

## 2014-02-14 MED ORDER — DENOSUMAB 120 MG/1.7ML ~~LOC~~ SOLN
120.0000 mg | Freq: Once | SUBCUTANEOUS | Status: AC
  Administered 2014-02-14: 120 mg via SUBCUTANEOUS
  Filled 2014-02-14: qty 1.7

## 2014-02-14 MED ORDER — ONDANSETRON 8 MG/50ML IVPB (CHCC)
8.0000 mg | Freq: Once | INTRAVENOUS | Status: AC
  Administered 2014-02-14: 8 mg via INTRAVENOUS

## 2014-02-14 MED ORDER — SODIUM CHLORIDE 0.9 % IV SOLN
500.0000 mg/m2 | Freq: Once | INTRAVENOUS | Status: AC
  Administered 2014-02-14: 1075 mg via INTRAVENOUS
  Filled 2014-02-14: qty 43

## 2014-02-14 MED ORDER — DEXAMETHASONE SODIUM PHOSPHATE 10 MG/ML IJ SOLN
10.0000 mg | Freq: Once | INTRAMUSCULAR | Status: AC
  Administered 2014-02-14: 10 mg via INTRAVENOUS

## 2014-02-14 MED ORDER — SODIUM CHLORIDE 0.9 % IJ SOLN
10.0000 mL | INTRAMUSCULAR | Status: DC | PRN
  Administered 2014-02-14: 10 mL
  Filled 2014-02-14: qty 10

## 2014-02-14 MED ORDER — FLUCONAZOLE 100 MG PO TABS: 100.0000 mg | ORAL_TABLET | Freq: Every day | ORAL | Status: DC

## 2014-02-14 MED ORDER — HEPARIN SOD (PORK) LOCK FLUSH 100 UNIT/ML IV SOLN
500.0000 [IU] | Freq: Once | INTRAVENOUS | Status: AC | PRN
  Administered 2014-02-14: 500 [IU]
  Filled 2014-02-14: qty 5

## 2014-02-14 MED ORDER — SODIUM CHLORIDE 0.9 % IJ SOLN
10.0000 mL | INTRAMUSCULAR | Status: DC | PRN
  Administered 2014-02-14: 10 mL via INTRAVENOUS
  Filled 2014-02-14: qty 10

## 2014-02-14 MED ORDER — DEXAMETHASONE SODIUM PHOSPHATE 10 MG/ML IJ SOLN
INTRAMUSCULAR | Status: AC
  Filled 2014-02-14: qty 1

## 2014-02-14 MED ORDER — ONDANSETRON 8 MG/NS 50 ML IVPB
INTRAVENOUS | Status: AC
  Filled 2014-02-14: qty 8

## 2014-02-14 MED ORDER — SODIUM CHLORIDE 0.9 % IV SOLN
Freq: Once | INTRAVENOUS | Status: AC
  Administered 2014-02-14: 12:00:00 via INTRAVENOUS

## 2014-02-14 NOTE — Patient Instructions (Signed)
[facility] Discharge Instructions for Patients Receiving Chemotherapy  Today you received the following chemotherapy agents Alimta/Xgeva.  To help prevent nausea and vomiting after your treatment, we encourage you to take your nausea medication as prescribed.   If you develop nausea and vomiting that is not controlled by your nausea medication, call the clinic.   BELOW ARE SYMPTOMS THAT SHOULD BE REPORTED IMMEDIATELY:  *FEVER GREATER THAN 100.5 F  *CHILLS WITH OR WITHOUT FEVER  NAUSEA AND VOMITING THAT IS NOT CONTROLLED WITH YOUR NAUSEA MEDICATION  *UNUSUAL SHORTNESS OF BREATH  *UNUSUAL BRUISING OR BLEEDING  TENDERNESS IN MOUTH AND THROAT WITH OR WITHOUT PRESENCE OF ULCERS  *URINARY PROBLEMS  *BOWEL PROBLEMS  UNUSUAL RASH Items with * indicate a potential emergency and should be followed up as soon as possible.  Feel free to call the clinic you have any questions or concerns. The clinic phone number is [phone].
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
## 2014-02-14 NOTE — Patient Instructions (Signed)

## 2014-02-14 NOTE — Telephone Encounter (Signed)
Pt confirmed labs/ov per 09/08 POF, gave pt AVS also left msg for IR (jennifer) to contact pt per referral...KJ

## 2014-02-15 ENCOUNTER — Other Ambulatory Visit: Payer: Self-pay | Admitting: Internal Medicine

## 2014-02-15 DIAGNOSIS — B370 Candidal stomatitis: Secondary | ICD-10-CM | POA: Insufficient documentation

## 2014-02-15 NOTE — Assessment & Plan Note (Signed)
Patient has moderate oral candidiasis. A prescription for Diflucan was sent to his pharmacy of record via [employer], 100 mg by mouth daily for the next 14 days.

## 2014-02-15 NOTE — Assessment & Plan Note (Signed)
His recent CT scan revealed probable pathologic fractures within the spine, left sacrum and iliac bone. We'll refer the patient to interventional radiology, to Dr. Estanislado Pandy for possible vertebroplasty/kyphoplasty. He will continue his current pain medications.

## 2014-02-15 NOTE — Assessment & Plan Note (Signed)
Patient is a 48 year old Caucasian male with metastatic non-small cell lung cancer, adenocarcinoma. His recent restaging CT scan revealed relatively stable disease with the exception of multifocal osseous metastatic disease with progressive sclerosis consistent with response to therapy. There were probable associated pathologic fractures within the spine, left sacrum and iliac bone but no gross epidural tumor. He presents to initiate maintenance chemotherapy with single agent Alimta. He'll proceed with his first cycle of every three-week Alimta at 500 mg per meter squared. He will return in 3 weeks for cycle #2.

## 2014-02-15 NOTE — Patient Instructions (Signed)
Take the Diflucan as prescribed for her the thrush Continue taking her pain medications as prescribed You have been referred to Dr. Estanislado Pandy in interventional Radiology regarding your back pain Followup in 3 weeks prior to next scheduled cycle of maintenance chemotherapy

## 2014-02-17 ENCOUNTER — Other Ambulatory Visit: Payer: Self-pay | Admitting: Physician Assistant

## 2014-02-17 DIAGNOSIS — C7951 Secondary malignant neoplasm of bone: Secondary | ICD-10-CM

## 2014-02-17 DIAGNOSIS — C7952 Secondary malignant neoplasm of bone marrow: Principal | ICD-10-CM

## 2014-02-20 ENCOUNTER — Other Ambulatory Visit: Payer: Self-pay | Admitting: *Deleted

## 2014-02-20 DIAGNOSIS — C342 Malignant neoplasm of middle lobe, bronchus or lung: Secondary | ICD-10-CM

## 2014-02-20 MED ORDER — OXYCODONE HCL 10 MG PO TABS: 10.0000 mg | ORAL_TABLET | ORAL | Status: DC | PRN

## 2014-02-21 ENCOUNTER — Other Ambulatory Visit: Payer: Self-pay | Admitting: *Deleted

## 2014-02-21 DIAGNOSIS — C342 Malignant neoplasm of middle lobe, bronchus or lung: Secondary | ICD-10-CM

## 2014-02-21 DIAGNOSIS — C7951 Secondary malignant neoplasm of bone: Secondary | ICD-10-CM

## 2014-02-21 MED ORDER — OXYCODONE HCL ER 60 MG PO T12A: 1.0000 | EXTENDED_RELEASE_TABLET | Freq: Two times a day (BID) | ORAL | Status: DC

## 2014-03-01 ENCOUNTER — Ambulatory Visit (HOSPITAL_COMMUNITY)
Admission: RE | Admit: 2014-03-01 | Discharge: 2014-03-01 | Disposition: A | Discharge status: Home / Self Care | Payer: Medicare Other | Source: Ambulatory Visit | Attending: Physician Assistant | Admitting: Physician Assistant

## 2014-03-01 ENCOUNTER — Other Ambulatory Visit: Payer: Self-pay | Admitting: *Deleted

## 2014-03-01 DIAGNOSIS — C342 Malignant neoplasm of middle lobe, bronchus or lung: Secondary | ICD-10-CM

## 2014-03-01 DIAGNOSIS — C7952 Secondary malignant neoplasm of bone marrow: Principal | ICD-10-CM

## 2014-03-01 DIAGNOSIS — C7951 Secondary malignant neoplasm of bone: Secondary | ICD-10-CM

## 2014-03-01 MED ORDER — OXYCODONE HCL 10 MG PO TABS: 10.0000 mg | ORAL_TABLET | ORAL | Status: DC | PRN

## 2014-03-03 ENCOUNTER — Other Ambulatory Visit (HOSPITAL_COMMUNITY): Payer: Self-pay | Admitting: Interventional Radiology

## 2014-03-03 DIAGNOSIS — C7952 Secondary malignant neoplasm of bone marrow: Secondary | ICD-10-CM

## 2014-03-03 DIAGNOSIS — C7951 Secondary malignant neoplasm of bone: Secondary | ICD-10-CM

## 2014-03-03 DIAGNOSIS — C3490 Malignant neoplasm of unspecified part of unspecified bronchus or lung: Secondary | ICD-10-CM

## 2014-03-06 ENCOUNTER — Telehealth: Payer: Self-pay | Admitting: Internal Medicine

## 2014-03-06 ENCOUNTER — Other Ambulatory Visit: Payer: Self-pay | Admitting: Radiology

## 2014-03-06 ENCOUNTER — Encounter (HOSPITAL_COMMUNITY): Payer: Self-pay | Admitting: Pharmacy Technician

## 2014-03-06 ENCOUNTER — Ambulatory Visit (HOSPITAL_BASED_OUTPATIENT_CLINIC_OR_DEPARTMENT_OTHER): Payer: Medicare Other | Admitting: Internal Medicine

## 2014-03-06 ENCOUNTER — Ambulatory Visit (HOSPITAL_BASED_OUTPATIENT_CLINIC_OR_DEPARTMENT_OTHER): Payer: Medicare Other

## 2014-03-06 ENCOUNTER — Other Ambulatory Visit (HOSPITAL_BASED_OUTPATIENT_CLINIC_OR_DEPARTMENT_OTHER): Payer: Medicare Other

## 2014-03-06 ENCOUNTER — Telehealth: Payer: Self-pay | Admitting: *Deleted

## 2014-03-06 VITALS — BP 96/54 | HR 105 | Temp 97.5°F | Resp 18 | Ht 71.0 in | Wt 200.3 lb

## 2014-03-06 VITALS — BP 112/64 | HR 101 | Temp 98.4°F

## 2014-03-06 DIAGNOSIS — F17200 Nicotine dependence, unspecified, uncomplicated: Secondary | ICD-10-CM

## 2014-03-06 DIAGNOSIS — C7951 Secondary malignant neoplasm of bone: Secondary | ICD-10-CM

## 2014-03-06 DIAGNOSIS — C7952 Secondary malignant neoplasm of bone marrow: Secondary | ICD-10-CM

## 2014-03-06 DIAGNOSIS — M549 Dorsalgia, unspecified: Secondary | ICD-10-CM

## 2014-03-06 DIAGNOSIS — C342 Malignant neoplasm of middle lobe, bronchus or lung: Secondary | ICD-10-CM

## 2014-03-06 DIAGNOSIS — Z95828 Presence of other vascular implants and grafts: Secondary | ICD-10-CM

## 2014-03-06 LAB — CBC WITH DIFFERENTIAL/PLATELET
BASO%: 1.2 % (ref 0.0–2.0)
Basophils Absolute: 0.1 10*3/uL (ref 0.0–0.1)
EOS%: 11.5 % — ABNORMAL HIGH (ref 0.0–7.0)
Eosinophils Absolute: 1.1 10*3/uL — ABNORMAL HIGH (ref 0.0–0.5)
HCT: 28.6 % — ABNORMAL LOW (ref 38.4–49.9)
HGB: 9.2 g/dL — ABNORMAL LOW (ref 13.0–17.1)
LYMPH%: 8.8 % — ABNORMAL LOW (ref 14.0–49.0)
MCH: 32.3 pg (ref 27.2–33.4)
MCHC: 32.2 g/dL (ref 32.0–36.0)
MCV: 100.3 fL — ABNORMAL HIGH (ref 79.3–98.0)
MONO#: 0.8 10*3/uL (ref 0.1–0.9)
MONO%: 7.7 % (ref 0.0–14.0)
NEUT%: 70.8 % (ref 39.0–75.0)
NEUTROS ABS: 7 10*3/uL — AB (ref 1.5–6.5)
Platelets: 133 10*3/uL — ABNORMAL LOW (ref 140–400)
RBC: 2.86 10*6/uL — AB (ref 4.20–5.82)
RDW: 20.4 % — AB (ref 11.0–14.6)
WBC: 9.9 10*3/uL (ref 4.0–10.3)
lymph#: 0.9 10*3/uL (ref 0.9–3.3)

## 2014-03-06 LAB — COMPREHENSIVE METABOLIC PANEL (CC13)
ALT: <6 U/L (ref 0–55)
AST: 9 U/L (ref 5–34)
Albumin: 3.2 g/dL — ABNORMAL LOW (ref 3.5–5.0)
Alkaline Phosphatase: 112 U/L (ref 40–150)
Anion Gap: 9 mEq/L (ref 3–11)
BUN: 9.2 mg/dL (ref 7.0–26.0)
CO2: 25 mEq/L (ref 22–29)
Calcium: 8.6 mg/dL (ref 8.4–10.4)
Chloride: 105 mEq/L (ref 98–109)
Creatinine: 0.7 mg/dL (ref 0.7–1.3)
Glucose: 98 mg/dl (ref 70–140)
Potassium: 4.4 mEq/L (ref 3.5–5.1)
Sodium: 139 mEq/L (ref 136–145)
Total Bilirubin: 0.33 mg/dL (ref 0.20–1.20)
Total Protein: 8 g/dL (ref 6.4–8.3)

## 2014-03-06 MED ORDER — SODIUM CHLORIDE 0.9 % IJ SOLN
10.0000 mL | INTRAMUSCULAR | Status: DC | PRN
  Administered 2014-03-06: 10 mL via INTRAVENOUS
  Filled 2014-03-06: qty 10

## 2014-03-06 MED ORDER — HEPARIN SOD (PORK) LOCK FLUSH 100 UNIT/ML IV SOLN
500.0000 [IU] | Freq: Once | INTRAVENOUS | Status: AC
Start: 2014-03-06 — End: 2014-03-06
  Administered 2014-03-06: 500 [IU] via INTRAVENOUS
  Filled 2014-03-06: qty 5

## 2014-03-06 NOTE — Telephone Encounter (Signed)
Pt confirmed labs/ov per 09/28 POF, sent msg to add chemo, gave pt AVS...Marland KitchenMarland KitchenMarland Kitchen KJ

## 2014-03-06 NOTE — Patient Instructions (Signed)
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
## 2014-03-06 NOTE — Telephone Encounter (Signed)
Per staff message and POF I have scheduled appts. Advised scheduler of appts. JMW  

## 2014-03-07 ENCOUNTER — Ambulatory Visit (HOSPITAL_BASED_OUTPATIENT_CLINIC_OR_DEPARTMENT_OTHER): Payer: Medicare Other

## 2014-03-07 ENCOUNTER — Ambulatory Visit: Payer: Medicare Other | Admitting: Nutrition

## 2014-03-07 ENCOUNTER — Other Ambulatory Visit: Payer: Self-pay

## 2014-03-07 ENCOUNTER — Other Ambulatory Visit: Payer: Self-pay | Admitting: Medical Oncology

## 2014-03-07 ENCOUNTER — Encounter: Payer: Self-pay | Admitting: *Deleted

## 2014-03-07 DIAGNOSIS — Z5111 Encounter for antineoplastic chemotherapy: Secondary | ICD-10-CM

## 2014-03-07 DIAGNOSIS — M549 Dorsalgia, unspecified: Secondary | ICD-10-CM

## 2014-03-07 DIAGNOSIS — C342 Malignant neoplasm of middle lobe, bronchus or lung: Secondary | ICD-10-CM

## 2014-03-07 DIAGNOSIS — C3490 Malignant neoplasm of unspecified part of unspecified bronchus or lung: Secondary | ICD-10-CM

## 2014-03-07 MED ORDER — HYDROMORPHONE HCL 4 MG/ML IJ SOLN: 2.0000 mg | INTRAMUSCULAR | Status: DC

## 2014-03-07 MED ORDER — SODIUM CHLORIDE 0.9 % IJ SOLN
10.0000 mL | INTRAMUSCULAR | Status: DC | PRN
  Administered 2014-03-07: 10 mL
  Filled 2014-03-07: qty 10

## 2014-03-07 MED ORDER — ONDANSETRON 8 MG/50ML IVPB (CHCC)
8.0000 mg | Freq: Once | INTRAVENOUS | Status: AC
  Administered 2014-03-07: 8 mg via INTRAVENOUS

## 2014-03-07 MED ORDER — DEXAMETHASONE SODIUM PHOSPHATE 10 MG/ML IJ SOLN
INTRAMUSCULAR | Status: AC
  Filled 2014-03-07: qty 1

## 2014-03-07 MED ORDER — HYDROMORPHONE HCL 4 MG/ML IJ SOLN
INTRAMUSCULAR | Status: AC
  Filled 2014-03-07: qty 1

## 2014-03-07 MED ORDER — SODIUM CHLORIDE 0.9 % IV SOLN
Freq: Once | INTRAVENOUS | Status: AC
  Administered 2014-03-07: 11:00:00 via INTRAVENOUS

## 2014-03-07 MED ORDER — HYDROMORPHONE HCL 4 MG/ML IJ SOLN
2.0000 mg | Freq: Once | INTRAMUSCULAR | Status: DC
Start: 2014-03-07 — End: 2014-03-07
  Administered 2014-03-07: 2 mg via INTRAVENOUS

## 2014-03-07 MED ORDER — DEXAMETHASONE SODIUM PHOSPHATE 10 MG/ML IJ SOLN
10.0000 mg | Freq: Once | INTRAMUSCULAR | Status: AC
  Administered 2014-03-07: 10 mg via INTRAVENOUS

## 2014-03-07 MED ORDER — HEPARIN SOD (PORK) LOCK FLUSH 100 UNIT/ML IV SOLN
500.0000 [IU] | Freq: Once | INTRAVENOUS | Status: AC | PRN
  Administered 2014-03-07: 500 [IU]
  Filled 2014-03-07: qty 5

## 2014-03-07 MED ORDER — ONDANSETRON 8 MG/NS 50 ML IVPB
INTRAVENOUS | Status: AC
  Filled 2014-03-07: qty 8

## 2014-03-07 MED ORDER — SODIUM CHLORIDE 0.9 % IV SOLN
500.0000 mg/m2 | Freq: Once | INTRAVENOUS | Status: AC
  Administered 2014-03-07: 1075 mg via INTRAVENOUS
  Filled 2014-03-07: qty 43

## 2014-03-07 NOTE — Progress Notes (Signed)
[facility] Suicide Risk Assessment Clinical Social Work  Clinical Social Work was referred by Eustaquio Maize, Therapist, sports, for suicidal ideation.  CSW met with patient and mother in infusion room.  Mr. Delmundo shared that he is in immense pain and "does not want to live like this anymore".  The patient stated he would not hurt himself because he is "a [place] and does not want to go to hell".  He reported he has tried to hurt himself in the past by swallowing multiple pills.  Mr. Rosiak mother is concerned about his well-being.  She stated he does not have access to his medications, but there are guns in his home (he lives in house behind his mother's house).  He refused to give his mother his guns, but agreed to let his mother stay with him while he is experiencing severe pain.  CSW discussed situation with medical oncologist, MD agreed to provide patient with additional pain medication while he is in infusion room.  Mr.   Holiday representative and patient discussed plan and completed Surveyor, mining. The patient identified the following plan of action which patient agrees to utilize should suicidal ideation increase occur: 1) Contact his mother, Fadi Menter or call the mobile crisis team [phone] or 2) Call 911 or  or 3) Go to [facility] Emergency Department for psychiatric evaluation or 4) Call [facility] mental health crisis line at [phone]  Mr. Wong stated he felt good about safety plan and patient's mother agreed.  CSW encouraged patient or mother to call with any additional questions or concerns.  MD notified of safety contract.  Safety contract has been sent to medical records department to be scanned in to patients medical record.  Polo Riley, MSW, LCSW, OSW-C Clinical Social Worker [facility] [phone]

## 2014-03-07 NOTE — Progress Notes (Signed)
Patient arrived upset, anxious, somewhat agitated and states he is in a lot of back pain.  PAC already accessed from yesterday. To receive alimta. Pt. Is to have a vertebral plasty on Friday for pain control due to tumor growth. Pt's mother is with him today.  He cannot even sit still in chemo chair.   During course of treatment pt. Voiced frustration and despair at his condition and ongoing pain. He stated at one point that he would take his own life when he got home today. His mother heard this comment too and was very concerned. Talked to pt about this claim and told him we could not let him leave without addressing his comments. Asked pt. If he would talk to SW and he was agreeable. SW Lauren came and spoke with pt and his mother at length.  SW developed contract with pt for when he goes home today.  Also received order from Dr. Julien Nordmann for 1 time dose of dilaudid 2 mg IV. Pt. Expressed that his pain was a little bit less within 10 minutes of receiving dilaudid.

## 2014-03-07 NOTE — Patient Instructions (Signed)
[place] Discharge Instructions for Patients Receiving Chemotherapy  Today you received the following chemotherapy agents: Alimta  To help prevent nausea and vomiting after your treatment, we encourage you to take your nausea medication:  Compazine 10mg  every 6 hours as needed.   If you develop nausea and vomiting that is not controlled by your nausea medication, call the clinic.   BELOW ARE SYMPTOMS THAT SHOULD BE REPORTED IMMEDIATELY:  *FEVER GREATER THAN 100.5 F  *CHILLS WITH OR WITHOUT FEVER  NAUSEA AND VOMITING THAT IS NOT CONTROLLED WITH YOUR NAUSEA MEDICATION  *UNUSUAL SHORTNESS OF BREATH  *UNUSUAL BRUISING OR BLEEDING  TENDERNESS IN MOUTH AND THROAT WITH OR WITHOUT PRESENCE OF ULCERS  *URINARY PROBLEMS  *BOWEL PROBLEMS  UNUSUAL RASH Items with * indicate a potential emergency and should be followed up as soon as possible.  Feel free to call the clinic you have any questions or concerns. The clinic phone number is [phone].

## 2014-03-07 NOTE — Progress Notes (Signed)
Nutrition followup completed with patient and mother, during chemotherapy. Patient reports increased pain today.  He has just taken his pain medication.   He reports nausea and poor appetite as well.  Patient states he has not had anything to eat yet today.   Weight documented as 200.3 pounds September 28, increased from 198.7 pounds July 21.  Nutrition diagnosis: Unintended weight loss improved.  Intervention:  Educated patient on dietary strategies for managing side effects such as nausea and poor appetite.   Encouraged patient to continue Carnation instant breakfast or special K drinks as desired to promote weight maintenance. Questions were answered.  Teach back method used.  Monitoring, evaluation, goals: Patient will tolerate adequate calories and protein to promote weight maintenance.  Next visit: Tuesday, October 20, during treatment.  **Disclaimer: This note was dictated with voice recognition software. Similar sounding words can inadvertently be transcribed and this note may contain transcription errors which may not have been corrected upon publication of note.**

## 2014-03-09 ENCOUNTER — Inpatient Hospital Stay (HOSPITAL_COMMUNITY): Admission: RE | Admit: 2014-03-09 | Payer: Self-pay | Source: Ambulatory Visit

## 2014-03-09 ENCOUNTER — Encounter (HOSPITAL_COMMUNITY): Payer: Self-pay | Admitting: *Deleted

## 2014-03-09 ENCOUNTER — Other Ambulatory Visit: Payer: Self-pay | Admitting: Radiology

## 2014-03-09 NOTE — Progress Notes (Signed)
Mr Crays had labs drawn 03/06/14, I shared a copy with Pam T for radiology and she said that the labs will be fine, patient will only need PT, PTT on day of procedure.  I asked Pam if we could call patient instead of having him come in since patient has updated labs and Chest X- Ray and EKG.  (patient is experiencing severe pain)  Pam said it would be fine to call patient instead of him coming in for PAT appt.

## 2014-03-10 ENCOUNTER — Ambulatory Visit (HOSPITAL_COMMUNITY)
Admission: RE | Admit: 2014-03-10 | Discharge: 2014-03-10 | Disposition: A | Discharge status: Home / Self Care | Payer: Medicare Other | Source: Ambulatory Visit | Attending: Interventional Radiology | Admitting: Interventional Radiology

## 2014-03-10 ENCOUNTER — Ambulatory Visit (HOSPITAL_COMMUNITY): Payer: Medicare Other

## 2014-03-10 ENCOUNTER — Other Ambulatory Visit: Payer: Self-pay | Admitting: Medical Oncology

## 2014-03-10 ENCOUNTER — Ambulatory Visit (HOSPITAL_COMMUNITY): Payer: Medicare Other | Admitting: Anesthesiology

## 2014-03-10 ENCOUNTER — Encounter (HOSPITAL_COMMUNITY): Payer: Self-pay

## 2014-03-10 ENCOUNTER — Encounter (HOSPITAL_COMMUNITY): Payer: Medicare Other | Admitting: Anesthesiology

## 2014-03-10 ENCOUNTER — Encounter (HOSPITAL_COMMUNITY): Payer: Self-pay | Admitting: Certified Registered Nurse Anesthetist

## 2014-03-10 ENCOUNTER — Encounter (HOSPITAL_COMMUNITY)
Admission: RE | Disposition: A | Discharge status: Home / Self Care | Payer: Self-pay | Source: Ambulatory Visit | Attending: Interventional Radiology

## 2014-03-10 DIAGNOSIS — F319 Bipolar disorder, unspecified: Secondary | ICD-10-CM | POA: Insufficient documentation

## 2014-03-10 DIAGNOSIS — C3490 Malignant neoplasm of unspecified part of unspecified bronchus or lung: Secondary | ICD-10-CM

## 2014-03-10 DIAGNOSIS — K219 Gastro-esophageal reflux disease without esophagitis: Secondary | ICD-10-CM | POA: Insufficient documentation

## 2014-03-10 DIAGNOSIS — C7951 Secondary malignant neoplasm of bone: Secondary | ICD-10-CM | POA: Diagnosis not present

## 2014-03-10 DIAGNOSIS — C412 Malignant neoplasm of vertebral column: Secondary | ICD-10-CM | POA: Diagnosis present

## 2014-03-10 DIAGNOSIS — F17210 Nicotine dependence, cigarettes, uncomplicated: Secondary | ICD-10-CM | POA: Diagnosis not present

## 2014-03-10 DIAGNOSIS — C7952 Secondary malignant neoplasm of bone marrow: Secondary | ICD-10-CM

## 2014-03-10 HISTORY — PX: RADIOLOGY WITH ANESTHESIA: SHX6223

## 2014-03-10 HISTORY — DX: Unspecified injury of head, initial encounter: S09.90XA

## 2014-03-10 LAB — APTT: aPTT: 36 seconds (ref 24–37)

## 2014-03-10 LAB — PROTIME-INR
INR: 1.03 (ref 0.00–1.49)
Prothrombin Time: 13.5 seconds (ref 11.6–15.2)

## 2014-03-10 SURGERY — RADIOLOGY WITH ANESTHESIA
Anesthesia: General

## 2014-03-10 MED ORDER — LIDOCAINE HCL (CARDIAC) 20 MG/ML IV SOLN
INTRAVENOUS | Status: DC | PRN
  Administered 2014-03-10: 100 mg via INTRAVENOUS

## 2014-03-10 MED ORDER — SUCCINYLCHOLINE CHLORIDE 20 MG/ML IJ SOLN
INTRAMUSCULAR | Status: DC | PRN
Start: 2014-03-10 — End: 2014-03-10
  Administered 2014-03-10: 140 mg via INTRAVENOUS

## 2014-03-10 MED ORDER — PROPOFOL 10 MG/ML IV BOLUS
INTRAVENOUS | Status: DC | PRN
  Administered 2014-03-10: 200 mg via INTRAVENOUS

## 2014-03-10 MED ORDER — HYDROMORPHONE HCL 1 MG/ML IJ SOLN
INTRAMUSCULAR | Status: AC
  Filled 2014-03-10: qty 1

## 2014-03-10 MED ORDER — OXYCODONE HCL 5 MG/5ML PO SOLN: 5.0000 mg | Freq: Once | ORAL | Status: AC | PRN

## 2014-03-10 MED ORDER — BUPIVACAINE HCL (PF) 0.25 % IJ SOLN
INTRAMUSCULAR | Status: AC
Start: 2014-03-10 — End: 2014-03-10
  Filled 2014-03-10: qty 30

## 2014-03-10 MED ORDER — DEXAMETHASONE SODIUM PHOSPHATE 4 MG/ML IJ SOLN
INTRAMUSCULAR | Status: DC | PRN
  Administered 2014-03-10: 4 mg via INTRAVENOUS

## 2014-03-10 MED ORDER — OXYCODONE HCL 5 MG PO TABS
5.0000 mg | ORAL_TABLET | Freq: Once | ORAL | Status: AC | PRN
  Administered 2014-03-10: 5 mg via ORAL

## 2014-03-10 MED ORDER — ONDANSETRON HCL 4 MG/2ML IJ SOLN
INTRAMUSCULAR | Status: DC | PRN
  Administered 2014-03-10: 4 mg via INTRAVENOUS

## 2014-03-10 MED ORDER — BUPIVACAINE HCL (PF) 0.25 % IJ SOLN
INTRAMUSCULAR | Status: AC
  Filled 2014-03-10: qty 30

## 2014-03-10 MED ORDER — ALBUTEROL SULFATE HFA 108 (90 BASE) MCG/ACT IN AERS
INHALATION_SPRAY | RESPIRATORY_TRACT | Status: DC | PRN
  Administered 2014-03-10: 6 via RESPIRATORY_TRACT

## 2014-03-10 MED ORDER — KETOROLAC TROMETHAMINE 30 MG/ML IJ SOLN
INTRAMUSCULAR | Status: DC | PRN
  Administered 2014-03-10: 30 mg via INTRAVENOUS

## 2014-03-10 MED ORDER — CEFAZOLIN SODIUM-DEXTROSE 2-3 GM-% IV SOLR
2.0000 g | Freq: Once | INTRAVENOUS | Status: AC
  Administered 2014-03-10: 2 g via INTRAVENOUS

## 2014-03-10 MED ORDER — NEOSTIGMINE METHYLSULFATE 10 MG/10ML IV SOLN
INTRAVENOUS | Status: DC | PRN
  Administered 2014-03-10: 5 mg via INTRAVENOUS

## 2014-03-10 MED ORDER — OXYCODONE HCL 10 MG PO TABS: 10.0000 mg | ORAL_TABLET | ORAL | Status: DC | PRN

## 2014-03-10 MED ORDER — ARTIFICIAL TEARS OP OINT
TOPICAL_OINTMENT | OPHTHALMIC | Status: DC | PRN
  Administered 2014-03-10: 1 via OPHTHALMIC

## 2014-03-10 MED ORDER — SODIUM CHLORIDE 0.9 % IV SOLN: Freq: Once | INTRAVENOUS | Status: DC

## 2014-03-10 MED ORDER — GLYCOPYRROLATE 0.2 MG/ML IJ SOLN
INTRAMUSCULAR | Status: DC | PRN
  Administered 2014-03-10: .7 mg via INTRAVENOUS

## 2014-03-10 MED ORDER — ONDANSETRON HCL 4 MG/2ML IJ SOLN: 4.0000 mg | Freq: Once | INTRAMUSCULAR | Status: DC | PRN

## 2014-03-10 MED ORDER — OXYCODONE HCL 5 MG PO TABS
ORAL_TABLET | ORAL | Status: DC
Start: 2014-03-10 — End: 2014-03-10
  Filled 2014-03-10: qty 1

## 2014-03-10 MED ORDER — CEFAZOLIN SODIUM-DEXTROSE 2-3 GM-% IV SOLR
INTRAVENOUS | Status: AC
  Filled 2014-03-10: qty 50

## 2014-03-10 MED ORDER — ROCURONIUM BROMIDE 100 MG/10ML IV SOLN
INTRAVENOUS | Status: DC | PRN
  Administered 2014-03-10: 50 mg via INTRAVENOUS

## 2014-03-10 MED ORDER — FENTANYL CITRATE 0.05 MG/ML IJ SOLN
INTRAMUSCULAR | Status: DC | PRN
  Administered 2014-03-10 (×2): 50 ug via INTRAVENOUS
  Administered 2014-03-10: 100 ug via INTRAVENOUS
  Administered 2014-03-10 (×2): 50 ug via INTRAVENOUS
  Administered 2014-03-10: 100 ug via INTRAVENOUS

## 2014-03-10 MED ORDER — LACTATED RINGERS IV SOLN
INTRAVENOUS | Status: DC
  Administered 2014-03-10: 08:00:00 via INTRAVENOUS

## 2014-03-10 MED ORDER — TOBRAMYCIN SULFATE 1.2 G IJ SOLR
INTRAMUSCULAR | Status: AC
  Filled 2014-03-10: qty 1.2

## 2014-03-10 MED ORDER — HYDROMORPHONE HCL 1 MG/ML IJ SOLN
0.2500 mg | INTRAMUSCULAR | Status: DC | PRN
  Administered 2014-03-10: 0.5 mg via INTRAVENOUS

## 2014-03-10 MED ORDER — SODIUM CHLORIDE 0.9 % IV SOLN: INTRAVENOUS | Status: DC

## 2014-03-10 MED ORDER — HYDROMORPHONE HCL 1 MG/ML IJ SOLN
0.2500 mg | INTRAMUSCULAR | Status: DC | PRN
  Administered 2014-03-10 (×2): 0.5 mg via INTRAVENOUS
  Administered 2014-03-10: 1 mg via INTRAVENOUS
  Administered 2014-03-10: 0.5 mg via INTRAVENOUS

## 2014-03-10 MED ORDER — LACTATED RINGERS IV SOLN
INTRAVENOUS | Status: DC | PRN
  Administered 2014-03-10 (×2): via INTRAVENOUS

## 2014-03-10 MED ORDER — MIDAZOLAM HCL 5 MG/5ML IJ SOLN
INTRAMUSCULAR | Status: DC | PRN
  Administered 2014-03-10: 2 mg via INTRAVENOUS
  Administered 2014-03-10 (×2): 1 mg via INTRAVENOUS

## 2014-03-10 NOTE — Anesthesia Preprocedure Evaluation (Addendum)
Anesthesia Evaluation  Patient identified by MRN, date of birth, ID band Patient awake    Reviewed: Allergy & Precautions, H&P , NPO status , Patient's Chart, lab work & pertinent test results  Airway Mallampati: II TM Distance: >3 FB Neck ROM: Full    Dental  (+) Teeth Intact, Dental Advisory Given,    Pulmonary COPDCurrent Smoker,    + wheezing (mild wheezing bilaterally, no hx of inhaler use.)      Cardiovascular Rhythm:Regular Rate:Normal     Neuro/Psych Depression Bipolar Disorder    GI/Hepatic GERD-  Medicated and Controlled,  Endo/Other    Renal/GU      Musculoskeletal   Abdominal   Peds  Hematology   Anesthesia Other Findings Hx of lung neoplasm.  Reproductive/Obstetrics                          Anesthesia Physical Anesthesia Plan  ASA: III  Anesthesia Plan: General   Post-op Pain Management:    Induction: Intravenous  Airway Management Planned: Oral ETT  Additional Equipment:   Intra-op Plan:   Post-operative Plan: Extubation in OR  Informed Consent: I have reviewed the patients History and Physical, chart, labs and discussed the procedure including the risks, benefits and alternatives for the proposed anesthesia with the patient or authorized representative who has indicated his/her understanding and acceptance.   Dental advisory given  Plan Discussed with: CRNA, Anesthesiologist and Surgeon  Anesthesia Plan Comments:         Anesthesia Quick Evaluation

## 2014-03-10 NOTE — Progress Notes (Signed)
Dr Estanislado Pandy in to see client

## 2014-03-10 NOTE — OR Nursing (Signed)
[name] arrived to pacu on a stretcher and c/o pain.  Attached to monitors and oxygen.  Pt. Very uncomfortable and insistent to sit up.  HOB raised to 30 degrees.  Dilaudid given for pain.  Call received from IR informing that CT was negative and Dr. Estanislado Pandy said we could remove the soft cervical collar.

## 2014-03-10 NOTE — Discharge Instructions (Signed)
KYPHOPLASTY/VERTEBROPLASTY DISCHARGE INSTRUCTIONS ° °Medications: (check all that apply) ° °   Resume all home medications as before procedure. °                 °  °Continue your pain medications as prescribed as needed.  Over the next 3-5 days, decrease your pain medication as tolerated.  Over the counter medications (i.e. Tylenol, ibuprofen, and aleve) may be substituted once severe/moderate pain symptoms have subsided. ° ° Wound Care: °- Bandages may be removed the day following your procedure.  You may get your incision wet once bandages are removed.  Bandaids may be used to cover the incisions until scab formation.  Topical ointments are optional. ° °- If you develop a fever greater than 101 degrees, have increased skin redness at the incision sites or pus-like oozing from incisions occurring within 1 week of the procedure, contact radiology at [phone] or [phone]. ° °- Ice pack to back for 15-20 minutes 2-3 time per day for first 2-3 days post procedure.  The ice will expedite muscle healing and help with the pain from the incisions. ° ° Activity: °- Bedrest today with limited activity for 24 hours post procedure. ° °- No driving for 48 hours. ° °- Increase your activity as tolerated after bedrest (with assistance if necessary). ° °- Refrain from any strenuous activity or heavy lifting (greater than 10 lbs.). ° ° Follow up: °- Contact radiology at [phone] or [phone] if any questions/concerns. ° °- A physician assistant from radiology will contact you in approximately 1 week. ° °- If a biopsy was performed at the time of your procedure, your referring physician should receive the results in usually 2-3 days. ° ° ° ° ° ° ° ° °

## 2014-03-10 NOTE — Progress Notes (Signed)
Called emergently  into IR room 2 by Dr Estanislado Pandy.  Pt found in arms of Don Broach, RT, [place] Ravens, Rt and CRNA off of left side of table.  Pt still intubated, IV intact, VS stable, wound sites stable. Lifted back onto IR table, stretcher obtained, moved to supine position on stretcher, C collar placed.  Dr Estanislado Pandy, Dr Al Corpus and Roe Coombs RN present.  CT of head and spine ordered.  Transported to CT suite with CRNA then to PACU.

## 2014-03-10 NOTE — Anesthesia Postprocedure Evaluation (Signed)
  Anesthesia Post-op Note  Patient: [name] [name]  Procedure(s) Performed: Procedure(s): RADIOLOGY WITH ANESTHESIA TUMOR ABLATION (N/A)  Patient Location: PACU  Anesthesia Type:General  Level of Consciousness: awake, alert  and oriented  Airway and Oxygen Therapy: Patient Spontanous Breathing and Patient connected to nasal cannula oxygen  Post-op Pain: moderate  Post-op Assessment: Post-op Vital signs reviewed and Patient's Cardiovascular Status Stable  Post-op Vital Signs: Reviewed and stable  Last Vitals:  Filed Vitals:   03/10/14 1253  BP:   Pulse: 89  Temp:   Resp:     Complications: Pt is wide awake and responding appropriately to questions. CT of C Spine was negative for injury. Soft Collar was removed.  CT head was negative for injury.
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
## 2014-03-10 NOTE — Procedures (Signed)
S/P T 12 VP and L2 VP

## 2014-03-10 NOTE — Anesthesia Procedure Notes (Signed)
Procedure Name: Intubation Date/Time: 03/10/2014 10:20 AM Performed by: Ollen Bowl Pre-anesthesia Checklist: Patient identified, Emergency Drugs available, Suction available, Patient being monitored and Timeout performed Patient Re-evaluated:Patient Re-evaluated prior to inductionOxygen Delivery Method: Circle system utilized and Simple face mask Preoxygenation: Pre-oxygenation with 100% oxygen Intubation Type: IV induction Ventilation: Mask ventilation without difficulty Laryngoscope Size: Mac and 4 Grade View: Grade I Tube type: Oral Tube size: 7.5 mm Number of attempts: 1 Airway Equipment and Method: Patient positioned with wedge pillow and Stylet Placement Confirmation: ETT inserted through vocal cords under direct vision,  positive ETCO2 and breath sounds checked- equal and bilateral Secured at: 23 cm Tube secured with: Tape Dental Injury: Teeth and Oropharynx as per pre-operative assessment

## 2014-03-10 NOTE — Transfer of Care (Signed)
Immediate Anesthesia Transfer of Care Note  Patient: [name] [name]  Procedure(s) Performed: Procedure(s): RADIOLOGY WITH ANESTHESIA TUMOR ABLATION (N/A)  Patient Location: PACU  Anesthesia Type:General  Level of Consciousness: awake, alert  and oriented  Airway & Oxygen Therapy: Patient Spontanous Breathing and Patient connected to nasal cannula oxygen  Post-op Assessment: Report given to PACU RN, Post -op Vital signs reviewed and stable and Patient moving all extremities X 4  Post vital signs: Reviewed and stable  Complications: No apparent anesthesia complications

## 2014-03-10 NOTE — Telephone Encounter (Signed)
REFILL FOR OXYCODONE GIVEN TO PTS MOTHER.

## 2014-03-13 ENCOUNTER — Encounter (HOSPITAL_COMMUNITY): Payer: Self-pay | Admitting: Interventional Radiology

## 2014-03-14 ENCOUNTER — Telehealth: Payer: Self-pay | Admitting: Medical Oncology

## 2014-03-14 NOTE — Telephone Encounter (Addendum)
His mother stated [name] [name] does not feel right- , his face is flushed -he does not have a fever. He said "I don't feel right in my head"  and is  afraid he is going to pass out. She says he does not each much and drinks good.

## 2014-03-14 NOTE — Telephone Encounter (Signed)
I spoke to [name] [name] and offered appt today and he does not want to come in for appt. I encouraged him to drink extra fluids today . [name] [name] said he has two periods during the day where he feels like this -2pm and 6 pm. His mom took his temperature and said he does not have a fever. I told [name] [name] and hsi mom to call back tomorrow with update.

## 2014-03-15 ENCOUNTER — Other Ambulatory Visit: Payer: Self-pay | Admitting: Internal Medicine

## 2014-03-15 NOTE — Telephone Encounter (Signed)
6.22.15 last office visit with you

## 2014-03-15 NOTE — Telephone Encounter (Signed)
OK X 3 mos 

## 2014-03-15 NOTE — Telephone Encounter (Signed)
rx script faxed to [facility]

## 2014-03-17 ENCOUNTER — Other Ambulatory Visit: Payer: Self-pay | Admitting: Internal Medicine

## 2014-03-17 ENCOUNTER — Other Ambulatory Visit: Payer: Self-pay | Admitting: *Deleted

## 2014-03-17 DIAGNOSIS — C3490 Malignant neoplasm of unspecified part of unspecified bronchus or lung: Secondary | ICD-10-CM

## 2014-03-17 MED ORDER — OXYCODONE HCL 10 MG PO TABS: 10.0000 mg | ORAL_TABLET | ORAL | Status: DC | PRN

## 2014-03-20 ENCOUNTER — Other Ambulatory Visit (HOSPITAL_COMMUNITY): Payer: Self-pay | Admitting: Interventional Radiology

## 2014-03-20 DIAGNOSIS — C342 Malignant neoplasm of middle lobe, bronchus or lung: Secondary | ICD-10-CM

## 2014-03-20 DIAGNOSIS — C7951 Secondary malignant neoplasm of bone: Secondary | ICD-10-CM

## 2014-03-21 ENCOUNTER — Other Ambulatory Visit: Payer: Self-pay | Admitting: Internal Medicine

## 2014-03-22 ENCOUNTER — Telehealth (HOSPITAL_COMMUNITY): Payer: Self-pay | Admitting: Interventional Radiology

## 2014-03-22 NOTE — Telephone Encounter (Signed)
Called pt, left VM for them to call to reschedule 2 wk f/u appt JM

## 2014-03-23 ENCOUNTER — Telehealth: Payer: Self-pay | Admitting: Nurse Practitioner

## 2014-03-23 ENCOUNTER — Other Ambulatory Visit: Payer: Self-pay | Admitting: Nurse Practitioner

## 2014-03-23 DIAGNOSIS — C3490 Malignant neoplasm of unspecified part of unspecified bronchus or lung: Secondary | ICD-10-CM

## 2014-03-23 MED ORDER — OXYCODONE HCL 10 MG PO TABS: 10.0000 mg | ORAL_TABLET | ORAL | Status: DC | PRN

## 2014-03-23 NOTE — Telephone Encounter (Signed)
Left message for [name] [name] to call back. Rn wants to see if patient had back surgery and if yes then did is pain better or worse. Pain Rx ready for pick up tomorrow 03/24/14

## 2014-03-24 ENCOUNTER — Telehealth: Payer: Self-pay | Admitting: Medical Oncology

## 2014-03-24 ENCOUNTER — Ambulatory Visit (HOSPITAL_COMMUNITY): Admission: RE | Admit: 2014-03-24 | Payer: Medicare Other | Source: Ambulatory Visit

## 2014-03-24 NOTE — Telephone Encounter (Signed)
[name] [name] said [name] [name] had partial back surgery on 03/10/14 on the upper part of his back because "he fell off table"  . He is rescheduled next week for the surgery on his lower  part of his back . She said his pain is more  in the lower back so he still has pain. I told her he had rx to pick up.

## 2014-03-28 ENCOUNTER — Encounter: Payer: Self-pay | Admitting: Physician Assistant

## 2014-03-28 ENCOUNTER — Ambulatory Visit: Payer: Medicare Other | Admitting: Nutrition

## 2014-03-28 ENCOUNTER — Ambulatory Visit (HOSPITAL_BASED_OUTPATIENT_CLINIC_OR_DEPARTMENT_OTHER): Payer: Medicare Other

## 2014-03-28 ENCOUNTER — Other Ambulatory Visit (HOSPITAL_BASED_OUTPATIENT_CLINIC_OR_DEPARTMENT_OTHER): Payer: Medicare Other

## 2014-03-28 ENCOUNTER — Ambulatory Visit: Payer: Medicare Other

## 2014-03-28 ENCOUNTER — Ambulatory Visit (HOSPITAL_BASED_OUTPATIENT_CLINIC_OR_DEPARTMENT_OTHER): Payer: Medicare Other | Admitting: Physician Assistant

## 2014-03-28 ENCOUNTER — Other Ambulatory Visit: Payer: Self-pay

## 2014-03-28 ENCOUNTER — Telehealth: Payer: Self-pay | Admitting: Internal Medicine

## 2014-03-28 VITALS — BP 108/68 | HR 110 | Temp 97.8°F | Resp 18 | Ht 71.0 in | Wt 200.3 lb

## 2014-03-28 DIAGNOSIS — C7951 Secondary malignant neoplasm of bone: Secondary | ICD-10-CM

## 2014-03-28 DIAGNOSIS — C342 Malignant neoplasm of middle lobe, bronchus or lung: Secondary | ICD-10-CM

## 2014-03-28 DIAGNOSIS — G8929 Other chronic pain: Secondary | ICD-10-CM

## 2014-03-28 DIAGNOSIS — Z95828 Presence of other vascular implants and grafts: Secondary | ICD-10-CM

## 2014-03-28 DIAGNOSIS — M549 Dorsalgia, unspecified: Secondary | ICD-10-CM

## 2014-03-28 DIAGNOSIS — G893 Neoplasm related pain (acute) (chronic): Secondary | ICD-10-CM

## 2014-03-28 DIAGNOSIS — C3490 Malignant neoplasm of unspecified part of unspecified bronchus or lung: Secondary | ICD-10-CM

## 2014-03-28 DIAGNOSIS — E440 Moderate protein-calorie malnutrition: Secondary | ICD-10-CM

## 2014-03-28 DIAGNOSIS — Z5111 Encounter for antineoplastic chemotherapy: Secondary | ICD-10-CM

## 2014-03-28 LAB — CBC WITH DIFFERENTIAL/PLATELET
BASO%: 0.2 % (ref 0.0–2.0)
Basophils Absolute: 0 10*3/uL (ref 0.0–0.1)
EOS ABS: 0.9 10*3/uL — AB (ref 0.0–0.5)
EOS%: 9.5 % — AB (ref 0.0–7.0)
HCT: 32.3 % — ABNORMAL LOW (ref 38.4–49.9)
HGB: 10 g/dL — ABNORMAL LOW (ref 13.0–17.1)
LYMPH%: 10.5 % — ABNORMAL LOW (ref 14.0–49.0)
MCH: 31.3 pg (ref 27.2–33.4)
MCHC: 31 g/dL — ABNORMAL LOW (ref 32.0–36.0)
MCV: 101.3 fL — ABNORMAL HIGH (ref 79.3–98.0)
MONO#: 0.9 10*3/uL (ref 0.1–0.9)
MONO%: 9.5 % (ref 0.0–14.0)
NEUT%: 70.3 % (ref 39.0–75.0)
NEUTROS ABS: 6.5 10*3/uL (ref 1.5–6.5)
Platelets: 136 10*3/uL — ABNORMAL LOW (ref 140–400)
RBC: 3.19 10*6/uL — AB (ref 4.20–5.82)
RDW: 18.8 % — AB (ref 11.0–14.6)
WBC: 9.2 10*3/uL (ref 4.0–10.3)
lymph#: 1 10*3/uL (ref 0.9–3.3)

## 2014-03-28 LAB — COMPREHENSIVE METABOLIC PANEL (CC13)
ALBUMIN: 3.4 g/dL — AB (ref 3.5–5.0)
ALK PHOS: 157 U/L — AB (ref 40–150)
ALT: 25 U/L (ref 0–55)
AST: 15 U/L (ref 5–34)
Anion Gap: 10 mEq/L (ref 3–11)
BUN: 9.3 mg/dL (ref 7.0–26.0)
CO2: 24 mEq/L (ref 22–29)
Calcium: 8.4 mg/dL (ref 8.4–10.4)
Chloride: 103 mEq/L (ref 98–109)
Creatinine: 0.7 mg/dL (ref 0.7–1.3)
GLUCOSE: 105 mg/dL (ref 70–140)
POTASSIUM: 3.8 meq/L (ref 3.5–5.1)
SODIUM: 136 meq/L (ref 136–145)
TOTAL PROTEIN: 7.5 g/dL (ref 6.4–8.3)
Total Bilirubin: 0.5 mg/dL (ref 0.20–1.20)

## 2014-03-28 MED ORDER — HYDROMORPHONE HCL 4 MG/ML IJ SOLN
2.0000 mg | Freq: Once | INTRAMUSCULAR | Status: AC
  Administered 2014-03-28: 2 mg via INTRAVENOUS

## 2014-03-28 MED ORDER — HYDROMORPHONE HCL 4 MG/ML IJ SOLN
INTRAMUSCULAR | Status: AC
  Filled 2014-03-28: qty 1

## 2014-03-28 MED ORDER — CYANOCOBALAMIN 1000 MCG/ML IJ SOLN
1000.0000 ug | Freq: Once | INTRAMUSCULAR | Status: AC
  Administered 2014-03-28: 1000 ug via INTRAMUSCULAR

## 2014-03-28 MED ORDER — OXYCODONE HCL 10 MG PO TABS: 10.0000 mg | ORAL_TABLET | ORAL | Status: DC | PRN

## 2014-03-28 MED ORDER — OXYCODONE HCL ER 60 MG PO T12A: EXTENDED_RELEASE_TABLET | ORAL | Status: DC

## 2014-03-28 MED ORDER — SODIUM CHLORIDE 0.9 % IJ SOLN
10.0000 mL | INTRAMUSCULAR | Status: DC | PRN
  Administered 2014-03-28: 10 mL via INTRAVENOUS
  Filled 2014-03-28: qty 10

## 2014-03-28 MED ORDER — ONDANSETRON 8 MG/50ML IVPB (CHCC)
8.0000 mg | Freq: Once | INTRAVENOUS | Status: AC
  Administered 2014-03-28: 8 mg via INTRAVENOUS

## 2014-03-28 MED ORDER — OXYCODONE HCL ER 60 MG PO T12A: 1.0000 | EXTENDED_RELEASE_TABLET | Freq: Two times a day (BID) | ORAL | Status: DC

## 2014-03-28 MED ORDER — HEPARIN SOD (PORK) LOCK FLUSH 100 UNIT/ML IV SOLN
500.0000 [IU] | Freq: Once | INTRAVENOUS | Status: AC | PRN
  Administered 2014-03-28: 500 [IU]
  Filled 2014-03-28: qty 5

## 2014-03-28 MED ORDER — HEPARIN SOD (PORK) LOCK FLUSH 100 UNIT/ML IV SOLN
500.0000 [IU] | Freq: Once | INTRAVENOUS | Status: AC
  Administered 2014-03-28: 500 [IU] via INTRAVENOUS
  Filled 2014-03-28: qty 5

## 2014-03-28 MED ORDER — ONDANSETRON 8 MG/NS 50 ML IVPB
INTRAVENOUS | Status: AC
  Filled 2014-03-28: qty 8

## 2014-03-28 MED ORDER — SODIUM CHLORIDE 0.9 % IV SOLN
500.0000 mg/m2 | Freq: Once | INTRAVENOUS | Status: AC
  Administered 2014-03-28: 1075 mg via INTRAVENOUS
  Filled 2014-03-28: qty 43

## 2014-03-28 MED ORDER — DEXAMETHASONE SODIUM PHOSPHATE 10 MG/ML IJ SOLN
INTRAMUSCULAR | Status: AC
  Filled 2014-03-28: qty 1

## 2014-03-28 MED ORDER — DENOSUMAB 120 MG/1.7ML ~~LOC~~ SOLN
120.0000 mg | Freq: Once | SUBCUTANEOUS | Status: AC
  Administered 2014-03-28: 120 mg via SUBCUTANEOUS
  Filled 2014-03-28: qty 1.7

## 2014-03-28 MED ORDER — CYANOCOBALAMIN 1000 MCG/ML IJ SOLN
INTRAMUSCULAR | Status: AC
  Filled 2014-03-28: qty 1

## 2014-03-28 MED ORDER — SODIUM CHLORIDE 0.9 % IV SOLN
Freq: Once | INTRAVENOUS | Status: AC
  Administered 2014-03-28: 13:00:00 via INTRAVENOUS

## 2014-03-28 MED ORDER — DEXAMETHASONE SODIUM PHOSPHATE 10 MG/ML IJ SOLN
10.0000 mg | Freq: Once | INTRAMUSCULAR | Status: AC
  Administered 2014-03-28: 10 mg via INTRAVENOUS

## 2014-03-28 MED ORDER — SODIUM CHLORIDE 0.9 % IJ SOLN
10.0000 mL | INTRAMUSCULAR | Status: DC | PRN
  Administered 2014-03-28: 10 mL
  Filled 2014-03-28: qty 10

## 2014-03-28 NOTE — Patient Instructions (Signed)
[facility] Discharge Instructions for Patients Receiving Chemotherapy  Today you received the following chemotherapy agent Alimta.  To help prevent nausea and vomiting after your treatment, we encourage you to take your nausea medication.   If you develop nausea and vomiting that is not controlled by your nausea medication, call the clinic.   BELOW ARE SYMPTOMS THAT SHOULD BE REPORTED IMMEDIATELY:  *FEVER GREATER THAN 100.5 F  *CHILLS WITH OR WITHOUT FEVER  NAUSEA AND VOMITING THAT IS NOT CONTROLLED WITH YOUR NAUSEA MEDICATION  *UNUSUAL SHORTNESS OF BREATH  *UNUSUAL BRUISING OR BLEEDING  TENDERNESS IN MOUTH AND THROAT WITH OR WITHOUT PRESENCE OF ULCERS  *URINARY PROBLEMS  *BOWEL PROBLEMS  UNUSUAL RASH Items with * indicate a potential emergency and should be followed up as soon as possible.  Feel free to call the clinic you have any questions or concerns. The clinic phone number is [phone].

## 2014-03-28 NOTE — Telephone Encounter (Signed)
gv adn printed appt sched and avs for pt for OCT and NOV....sed added tx.

## 2014-03-28 NOTE — Progress Notes (Signed)
Nutrition followup completed with patient during chemotherapy.  Patient again reports increased pain and is requesting pain medication. Patient reports he is feeling well.  He denies nausea and states appetite has improved. Weight stable and documented as 200.3 pounds on October 20 from 200.3 pounds September 28.  Nutrition diagnosis: Unintended weight loss resolved.  Encouraged patient to continue strategies for adequate calories and protein to promote weight maintenance.  Recommended patient contact me with any further questions or concerns.  No followup scheduled at this time.  **Disclaimer: This note was dictated with voice recognition software. Similar sounding words can inadvertently be transcribed and this note may contain transcription errors which may not have been corrected upon publication of note.**

## 2014-03-28 NOTE — Progress Notes (Signed)
1255 Patient arrived to infusion room complaining of pain and asking for "a pain shot" reports pain to be at a 10/10 to back and hips. [employer] informed.  1425 Per MD, patient to have 2 mg Dilaudid IV. Patient educated to bring pain meds with him during his treatments.  1434 Patient d/c reports pain better, 8/10. Patient d/c with mom via w/c.

## 2014-03-28 NOTE — Patient Instructions (Signed)
Take the OxyContin 60 mg by mouth every 8 hours. Continue to take the oxycodone as prescribed Follow up in 3 weeks with a restaging CT scan of your chest, abdomen and pelvis to re-evaluate your disease
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
## 2014-03-28 NOTE — Patient Instructions (Signed)

## 2014-03-29 ENCOUNTER — Telehealth: Payer: Self-pay | Admitting: *Deleted

## 2014-03-29 NOTE — Telephone Encounter (Signed)
Pt called stating that his arm hurts after getting both the xgeva and b12 shots in the same arm.  Advised he apply warm compresses and call if anything gets worse or changes.  He verbalized understanding

## 2014-03-30 ENCOUNTER — Ambulatory Visit (HOSPITAL_COMMUNITY)
Admission: RE | Admit: 2014-03-30 | Discharge: 2014-03-30 | Disposition: A | Discharge status: Home / Self Care | Payer: Medicare Other | Source: Ambulatory Visit | Attending: Interventional Radiology | Admitting: Interventional Radiology

## 2014-03-30 DIAGNOSIS — C342 Malignant neoplasm of middle lobe, bronchus or lung: Secondary | ICD-10-CM

## 2014-03-30 DIAGNOSIS — C7951 Secondary malignant neoplasm of bone: Secondary | ICD-10-CM

## 2014-04-06 ENCOUNTER — Telehealth (HOSPITAL_COMMUNITY): Payer: Self-pay | Admitting: Interventional Radiology

## 2014-04-06 ENCOUNTER — Other Ambulatory Visit (HOSPITAL_COMMUNITY): Payer: Self-pay | Admitting: Interventional Radiology

## 2014-04-06 DIAGNOSIS — C7951 Secondary malignant neoplasm of bone: Secondary | ICD-10-CM

## 2014-04-06 DIAGNOSIS — C3490 Malignant neoplasm of unspecified part of unspecified bronchus or lung: Secondary | ICD-10-CM

## 2014-04-06 NOTE — Telephone Encounter (Signed)
Called pt, left VM for him or his mother to call to schedule ablation JM

## 2014-04-07 ENCOUNTER — Other Ambulatory Visit: Payer: Self-pay | Admitting: Radiology

## 2014-04-07 ENCOUNTER — Other Ambulatory Visit: Payer: Self-pay | Admitting: Medical Oncology

## 2014-04-07 DIAGNOSIS — C7949 Secondary malignant neoplasm of other parts of nervous system: Secondary | ICD-10-CM

## 2014-04-07 DIAGNOSIS — C3490 Malignant neoplasm of unspecified part of unspecified bronchus or lung: Secondary | ICD-10-CM

## 2014-04-07 MED ORDER — OXYCODONE HCL 10 MG PO TABS: 10.0000 mg | ORAL_TABLET | ORAL | Status: DC | PRN

## 2014-04-07 NOTE — Progress Notes (Signed)
Mother called to request refill  For oxycodone. Rx locked in injection room for pickup monday

## 2014-04-10 ENCOUNTER — Other Ambulatory Visit: Payer: Self-pay | Admitting: Radiology

## 2014-04-11 ENCOUNTER — Other Ambulatory Visit (HOSPITAL_COMMUNITY): Payer: Self-pay | Admitting: Interventional Radiology

## 2014-04-11 ENCOUNTER — Encounter (HOSPITAL_COMMUNITY): Payer: Self-pay

## 2014-04-11 ENCOUNTER — Ambulatory Visit (HOSPITAL_COMMUNITY)
Admission: RE | Admit: 2014-04-11 | Discharge: 2014-04-11 | Disposition: A | Discharge status: Home / Self Care | Payer: Medicare Other | Source: Ambulatory Visit | Attending: Interventional Radiology | Admitting: Interventional Radiology

## 2014-04-11 DIAGNOSIS — C7951 Secondary malignant neoplasm of bone: Secondary | ICD-10-CM

## 2014-04-11 DIAGNOSIS — Z79899 Other long term (current) drug therapy: Secondary | ICD-10-CM | POA: Insufficient documentation

## 2014-04-11 DIAGNOSIS — C3490 Malignant neoplasm of unspecified part of unspecified bronchus or lung: Secondary | ICD-10-CM

## 2014-04-11 DIAGNOSIS — Z87311 Personal history of (healed) other pathological fracture: Secondary | ICD-10-CM | POA: Diagnosis not present

## 2014-04-11 DIAGNOSIS — F17210 Nicotine dependence, cigarettes, uncomplicated: Secondary | ICD-10-CM | POA: Insufficient documentation

## 2014-04-11 DIAGNOSIS — M8458XA Pathological fracture in neoplastic disease, other specified site, initial encounter for fracture: Secondary | ICD-10-CM | POA: Diagnosis not present

## 2014-04-11 DIAGNOSIS — C342 Malignant neoplasm of middle lobe, bronchus or lung: Secondary | ICD-10-CM | POA: Diagnosis not present

## 2014-04-11 DIAGNOSIS — Z79891 Long term (current) use of opiate analgesic: Secondary | ICD-10-CM | POA: Diagnosis not present

## 2014-04-11 DIAGNOSIS — G8929 Other chronic pain: Secondary | ICD-10-CM | POA: Insufficient documentation

## 2014-04-11 DIAGNOSIS — M549 Dorsalgia, unspecified: Secondary | ICD-10-CM | POA: Diagnosis present

## 2014-04-11 LAB — CBC WITH DIFFERENTIAL/PLATELET
Basophils Absolute: 0 10*3/uL (ref 0.0–0.1)
Basophils Relative: 0 % (ref 0–1)
EOS ABS: 0.8 10*3/uL — AB (ref 0.0–0.7)
EOS PCT: 8 % — AB (ref 0–5)
HCT: 35.1 % — ABNORMAL LOW (ref 39.0–52.0)
Hemoglobin: 11 g/dL — ABNORMAL LOW (ref 13.0–17.0)
LYMPHS ABS: 1 10*3/uL (ref 0.7–4.0)
Lymphocytes Relative: 10 % — ABNORMAL LOW (ref 12–46)
MCH: 32.2 pg (ref 26.0–34.0)
MCHC: 31.3 g/dL (ref 30.0–36.0)
MCV: 102.6 fL — AB (ref 78.0–100.0)
MONO ABS: 1 10*3/uL (ref 0.1–1.0)
Monocytes Relative: 10 % (ref 3–12)
Neutro Abs: 7 10*3/uL (ref 1.7–7.7)
Neutrophils Relative %: 72 % (ref 43–77)
PLATELETS: 129 10*3/uL — AB (ref 150–400)
RBC: 3.42 MIL/uL — ABNORMAL LOW (ref 4.22–5.81)
RDW: 18.9 % — ABNORMAL HIGH (ref 11.5–15.5)
WBC: 9.8 10*3/uL (ref 4.0–10.5)

## 2014-04-11 LAB — BASIC METABOLIC PANEL
Anion gap: 15 (ref 5–15)
BUN: 7 mg/dL (ref 6–23)
CO2: 25 mEq/L (ref 19–32)
Calcium: 8.4 mg/dL (ref 8.4–10.5)
Chloride: 99 mEq/L (ref 96–112)
Creatinine, Ser: 0.65 mg/dL (ref 0.50–1.35)
GFR calc non Af Amer: >90 mL/min (ref >90)
GLUCOSE: 98 mg/dL (ref 70–99)
Potassium: 4.3 mEq/L (ref 3.7–5.3)
Sodium: 139 mEq/L (ref 137–147)

## 2014-04-11 LAB — APTT: aPTT: 24 seconds (ref 24–37)

## 2014-04-11 LAB — PROTIME-INR
INR: 0.99 (ref 0.00–1.49)
PROTHROMBIN TIME: 13.2 s (ref 11.6–15.2)

## 2014-04-11 MED ORDER — CEFAZOLIN SODIUM-DEXTROSE 2-3 GM-% IV SOLR
INTRAVENOUS | Status: AC
  Administered 2014-04-11: 2 g via INTRAVENOUS
  Filled 2014-04-11: qty 50

## 2014-04-11 MED ORDER — SODIUM CHLORIDE 0.9 % IV SOLN: INTRAVENOUS | Status: AC

## 2014-04-11 MED ORDER — MIDAZOLAM HCL 2 MG/2ML IJ SOLN
INTRAMUSCULAR | Status: AC | PRN
  Administered 2014-04-11 (×5): 1 mg via INTRAVENOUS

## 2014-04-11 MED ORDER — HYDROMORPHONE HCL 1 MG/ML IJ SOLN
INTRAMUSCULAR | Status: AC
  Filled 2014-04-11: qty 3

## 2014-04-11 MED ORDER — BUPIVACAINE HCL (PF) 0.25 % IJ SOLN
INTRAMUSCULAR | Status: AC
Start: 2014-04-11 — End: 2014-04-11
  Filled 2014-04-11: qty 30

## 2014-04-11 MED ORDER — MIDAZOLAM HCL 2 MG/2ML IJ SOLN
INTRAMUSCULAR | Status: AC
Start: 2014-04-11 — End: 2014-04-11
  Filled 2014-04-11: qty 6

## 2014-04-11 MED ORDER — CEFAZOLIN SODIUM-DEXTROSE 2-3 GM-% IV SOLR
2.0000 g | INTRAVENOUS | Status: AC
Start: 2014-04-11 — End: 2014-04-11
  Administered 2014-04-11: 2 g via INTRAVENOUS

## 2014-04-11 MED ORDER — IOHEXOL 300 MG/ML  SOLN
50.0000 mL | Freq: Once | INTRAMUSCULAR | Status: AC | PRN
  Administered 2014-04-11: 1 mL via INTRAVENOUS

## 2014-04-11 MED ORDER — FENTANYL CITRATE 0.05 MG/ML IJ SOLN
INTRAMUSCULAR | Status: AC | PRN
  Administered 2014-04-11 (×7): 25 ug via INTRAVENOUS

## 2014-04-11 MED ORDER — BUPIVACAINE HCL (PF) 0.25 % IJ SOLN
INTRAMUSCULAR | Status: AC
  Filled 2014-04-11: qty 30

## 2014-04-11 MED ORDER — TOBRAMYCIN SULFATE 1.2 G IJ SOLR
INTRAMUSCULAR | Status: AC
  Filled 2014-04-11: qty 1.2

## 2014-04-11 MED ORDER — SODIUM CHLORIDE 0.9 % IV SOLN
INTRAVENOUS | Status: DC
  Administered 2014-04-11: 10:00:00 via INTRAVENOUS

## 2014-04-11 MED ORDER — FENTANYL CITRATE 0.05 MG/ML IJ SOLN
INTRAMUSCULAR | Status: AC
  Filled 2014-04-11: qty 6

## 2014-04-11 MED ORDER — HYDROMORPHONE HCL 1 MG/ML IJ SOLN
INTRAMUSCULAR | Status: AC | PRN
  Administered 2014-04-11 (×6): 1 mg via INTRAVENOUS

## 2014-04-11 NOTE — Sedation Documentation (Signed)
Pt still talking loudly, saying OH GOD and I'm a girl, I can't take it.  Stating pain going down left leg and in left hip.

## 2014-04-11 NOTE — Sedation Documentation (Signed)
Pt raising voice stating it hurts during procedure.
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
## 2014-04-11 NOTE — Progress Notes (Signed)
Spoke with Jannifer Franklin, PA in reference to patient taking Oxycontin 60mg  from home for chronic back pain.  Pam stated that it would be okay for patient to medication from home.

## 2014-04-11 NOTE — Sedation Documentation (Signed)
Pt moaning and saying he is in pain.

## 2014-04-11 NOTE — Discharge Instructions (Signed)
1No stooping,bending or lifting more than 10 lbs for 2 weeks. 2.Use walker to ambulates for 2 weeks 3.RTC in 2 weeks  KYPHOPLASTY/VERTEBROPLASTY DISCHARGE INSTRUCTIONS  Medications: (check all that apply)     Resume all home medications as before procedure.   Continue your pain medications as prescribed as needed.  Over the next 3-5 days, decrease your pain medication as tolerated.  Over the counter medications (i.e. Tylenol, ibuprofen, and aleve) may be substituted once severe/moderate pain symptoms have subsided.   Wound Care: - Bandages may be removed the day following your procedure.  You may get your incision wet once bandages are removed.  Bandaids may be used to cover the incisions until scab formation.  Topical ointments are optional.  - If you develop a fever greater than 101 degrees, have increased skin redness at the incision sites or pus-like oozing from incisions occurring within 1 week of the procedure, contact radiology at [phone] or [phone].  - Ice pack to back for 15-20 minutes 2-3 time per day for first 2-3 days post procedure.  The ice will expedite muscle healing and help with the pain from the incisions.   Activity: - Bedrest today with limited activity for 24 hours post procedure.  - No driving for 48 hours.  - Increase your activity as tolerated after bedrest (with assistance if necessary).  - Refrain from any strenuous activity or heavy lifting (greater than 10 lbs.).   Follow up: - Contact radiology at [phone] or [phone] if any questions/concerns.  - A physician assistant from radiology will contact you in approximately 1 week.  - If a biopsy was performed at the time of your procedure, your referring physician should receive the results in usually 2-3 days.

## 2014-04-11 NOTE — Procedures (Signed)
S/P bilateral sacroplasty  With biopsy

## 2014-04-11 NOTE — Sedation Documentation (Signed)
Pt still complaining of pain and saying, "Jesus, I'm too old for this."

## 2014-04-11 NOTE — Sedation Documentation (Signed)
C/o burning to rt ankle, but stated it is going away now.

## 2014-04-11 NOTE — Sedation Documentation (Signed)
Pt remains alert and c/o pain.  VSS.

## 2014-04-14 ENCOUNTER — Encounter (HOSPITAL_COMMUNITY): Payer: Self-pay

## 2014-04-14 ENCOUNTER — Ambulatory Visit (HOSPITAL_COMMUNITY)
Admission: RE | Admit: 2014-04-14 | Discharge: 2014-04-14 | Disposition: A | Discharge status: Home / Self Care | Payer: Medicare Other | Source: Ambulatory Visit | Attending: Physician Assistant | Admitting: Physician Assistant

## 2014-04-14 DIAGNOSIS — I700 Atherosclerosis of aorta: Secondary | ICD-10-CM | POA: Insufficient documentation

## 2014-04-14 DIAGNOSIS — R634 Abnormal weight loss: Secondary | ICD-10-CM | POA: Insufficient documentation

## 2014-04-14 DIAGNOSIS — C3490 Malignant neoplasm of unspecified part of unspecified bronchus or lung: Secondary | ICD-10-CM | POA: Diagnosis not present

## 2014-04-14 DIAGNOSIS — C342 Malignant neoplasm of middle lobe, bronchus or lung: Secondary | ICD-10-CM

## 2014-04-14 DIAGNOSIS — C7951 Secondary malignant neoplasm of bone: Secondary | ICD-10-CM | POA: Insufficient documentation

## 2014-04-14 DIAGNOSIS — M84559A Pathological fracture in neoplastic disease, hip, unspecified, initial encounter for fracture: Secondary | ICD-10-CM | POA: Diagnosis not present

## 2014-04-14 MED ORDER — IOHEXOL 300 MG/ML  SOLN
100.0000 mL | Freq: Once | INTRAMUSCULAR | Status: AC | PRN
  Administered 2014-04-14: 100 mL via INTRAVENOUS

## 2014-04-18 ENCOUNTER — Telehealth: Payer: Self-pay | Admitting: Internal Medicine

## 2014-04-18 ENCOUNTER — Ambulatory Visit (HOSPITAL_BASED_OUTPATIENT_CLINIC_OR_DEPARTMENT_OTHER): Payer: Medicare Other | Admitting: Internal Medicine

## 2014-04-18 ENCOUNTER — Encounter: Payer: Self-pay | Admitting: Nutrition

## 2014-04-18 ENCOUNTER — Ambulatory Visit: Payer: Self-pay

## 2014-04-18 ENCOUNTER — Ambulatory Visit (HOSPITAL_BASED_OUTPATIENT_CLINIC_OR_DEPARTMENT_OTHER): Payer: Medicare Other

## 2014-04-18 ENCOUNTER — Encounter: Payer: Self-pay | Admitting: Internal Medicine

## 2014-04-18 ENCOUNTER — Ambulatory Visit: Payer: Medicare Other

## 2014-04-18 ENCOUNTER — Other Ambulatory Visit: Payer: Self-pay

## 2014-04-18 ENCOUNTER — Other Ambulatory Visit (HOSPITAL_BASED_OUTPATIENT_CLINIC_OR_DEPARTMENT_OTHER): Payer: Medicare Other

## 2014-04-18 VITALS — BP 93/63 | HR 109 | Temp 99.2°F | Resp 17 | Ht 69.0 in | Wt 204.4 lb

## 2014-04-18 DIAGNOSIS — Z23 Encounter for immunization: Secondary | ICD-10-CM

## 2014-04-18 DIAGNOSIS — C7951 Secondary malignant neoplasm of bone: Secondary | ICD-10-CM

## 2014-04-18 DIAGNOSIS — C7949 Secondary malignant neoplasm of other parts of nervous system: Secondary | ICD-10-CM

## 2014-04-18 DIAGNOSIS — Z95828 Presence of other vascular implants and grafts: Secondary | ICD-10-CM

## 2014-04-18 DIAGNOSIS — C342 Malignant neoplasm of middle lobe, bronchus or lung: Secondary | ICD-10-CM

## 2014-04-18 DIAGNOSIS — Z5111 Encounter for antineoplastic chemotherapy: Secondary | ICD-10-CM

## 2014-04-18 DIAGNOSIS — E279 Disorder of adrenal gland, unspecified: Secondary | ICD-10-CM

## 2014-04-18 DIAGNOSIS — Z7951 Long term (current) use of inhaled steroids: Secondary | ICD-10-CM

## 2014-04-18 DIAGNOSIS — C3490 Malignant neoplasm of unspecified part of unspecified bronchus or lung: Secondary | ICD-10-CM

## 2014-04-18 LAB — COMPREHENSIVE METABOLIC PANEL (CC13)
ALBUMIN: 3 g/dL — AB (ref 3.5–5.0)
ALT: 13 U/L (ref 0–55)
ANION GAP: 8 meq/L (ref 3–11)
AST: 12 U/L (ref 5–34)
Alkaline Phosphatase: 163 U/L — ABNORMAL HIGH (ref 40–150)
BUN: 5.8 mg/dL — ABNORMAL LOW (ref 7.0–26.0)
CALCIUM: 9 mg/dL (ref 8.4–10.4)
CO2: 27 meq/L (ref 22–29)
Chloride: 104 mEq/L (ref 98–109)
Creatinine: 0.8 mg/dL (ref 0.7–1.3)
Glucose: 117 mg/dl (ref 70–140)
POTASSIUM: 3.9 meq/L (ref 3.5–5.1)
Sodium: 139 mEq/L (ref 136–145)
Total Bilirubin: 0.33 mg/dL (ref 0.20–1.20)
Total Protein: 7.1 g/dL (ref 6.4–8.3)

## 2014-04-18 LAB — CBC WITH DIFFERENTIAL/PLATELET
BASO%: 0.6 % (ref 0.0–2.0)
Basophils Absolute: 0.1 10*3/uL (ref 0.0–0.1)
EOS%: 10.3 % — AB (ref 0.0–7.0)
Eosinophils Absolute: 1.1 10*3/uL — ABNORMAL HIGH (ref 0.0–0.5)
HCT: 32.7 % — ABNORMAL LOW (ref 38.4–49.9)
HEMOGLOBIN: 10.1 g/dL — AB (ref 13.0–17.1)
LYMPH%: 7.8 % — ABNORMAL LOW (ref 14.0–49.0)
MCH: 31.6 pg (ref 27.2–33.4)
MCHC: 30.9 g/dL — ABNORMAL LOW (ref 32.0–36.0)
MCV: 102.2 fL — ABNORMAL HIGH (ref 79.3–98.0)
MONO#: 1 10*3/uL — ABNORMAL HIGH (ref 0.1–0.9)
MONO%: 9.4 % (ref 0.0–14.0)
NEUT#: 7.7 10*3/uL — ABNORMAL HIGH (ref 1.5–6.5)
NEUT%: 71.9 % (ref 39.0–75.0)
PLATELETS: 179 10*3/uL (ref 140–400)
RBC: 3.2 10*6/uL — ABNORMAL LOW (ref 4.20–5.82)
RDW: 18.6 % — AB (ref 11.0–14.6)
WBC: 10.6 10*3/uL — ABNORMAL HIGH (ref 4.0–10.3)
lymph#: 0.8 10*3/uL — ABNORMAL LOW (ref 0.9–3.3)
nRBC: 0 % (ref 0–0)

## 2014-04-18 MED ORDER — DEXAMETHASONE SODIUM PHOSPHATE 10 MG/ML IJ SOLN
INTRAMUSCULAR | Status: AC
  Filled 2014-04-18: qty 1

## 2014-04-18 MED ORDER — HYDROMORPHONE HCL 4 MG/ML IJ SOLN
2.0000 mg | Freq: Once | INTRAMUSCULAR | Status: AC
  Administered 2014-04-18: 2 mg via INTRAVENOUS

## 2014-04-18 MED ORDER — SODIUM CHLORIDE 0.9 % IV SOLN
500.0000 mg/m2 | Freq: Once | INTRAVENOUS | Status: AC
  Administered 2014-04-18: 1075 mg via INTRAVENOUS
  Filled 2014-04-18: qty 43

## 2014-04-18 MED ORDER — HEPARIN SOD (PORK) LOCK FLUSH 100 UNIT/ML IV SOLN
500.0000 [IU] | Freq: Once | INTRAVENOUS | Status: AC | PRN
  Administered 2014-04-18: 500 [IU]
  Filled 2014-04-18: qty 5

## 2014-04-18 MED ORDER — SODIUM CHLORIDE 0.9 % IJ SOLN
10.0000 mL | INTRAMUSCULAR | Status: DC | PRN
  Administered 2014-04-18: 10 mL
  Filled 2014-04-18: qty 10

## 2014-04-18 MED ORDER — ONDANSETRON 8 MG/50ML IVPB (CHCC)
8.0000 mg | Freq: Once | INTRAVENOUS | Status: AC
  Administered 2014-04-18: 8 mg via INTRAVENOUS

## 2014-04-18 MED ORDER — INFLUENZA VAC SPLIT QUAD 0.5 ML IM SUSY
0.5000 mL | PREFILLED_SYRINGE | Freq: Once | INTRAMUSCULAR | Status: AC
  Administered 2014-04-18: 0.5 mL via INTRAMUSCULAR
  Filled 2014-04-18: qty 0.5

## 2014-04-18 MED ORDER — SODIUM CHLORIDE 0.9 % IV SOLN
Freq: Once | INTRAVENOUS | Status: AC
  Administered 2014-04-18: 10:00:00 via INTRAVENOUS

## 2014-04-18 MED ORDER — SODIUM CHLORIDE 0.9 % IJ SOLN
10.0000 mL | INTRAMUSCULAR | Status: DC | PRN
  Administered 2014-04-18: 10 mL via INTRAVENOUS
  Filled 2014-04-18: qty 10

## 2014-04-18 MED ORDER — DEXAMETHASONE SODIUM PHOSPHATE 10 MG/ML IJ SOLN
10.0000 mg | Freq: Once | INTRAMUSCULAR | Status: AC
  Administered 2014-04-18: 10 mg via INTRAVENOUS

## 2014-04-18 MED ORDER — ONDANSETRON 8 MG/NS 50 ML IVPB
INTRAVENOUS | Status: AC
  Filled 2014-04-18: qty 8

## 2014-04-18 MED ORDER — HYDROMORPHONE HCL 4 MG/ML IJ SOLN
INTRAMUSCULAR | Status: AC
  Filled 2014-04-18: qty 1

## 2014-04-18 MED ORDER — OXYCODONE HCL 10 MG PO TABS: 10.0000 mg | ORAL_TABLET | ORAL | Status: DC | PRN

## 2014-04-18 NOTE — Patient Instructions (Signed)
[facility] Discharge Instructions for Patients Receiving Chemotherapy  Today you received the following chemotherapy agents: Alimta  To help prevent nausea and vomiting after your treatment, we encourage you to take your nausea medication as prescribed.   If you develop nausea and vomiting that is not controlled by your nausea medication, call the clinic.   BELOW ARE SYMPTOMS THAT SHOULD BE REPORTED IMMEDIATELY:  *FEVER GREATER THAN 100.5 F  *CHILLS WITH OR WITHOUT FEVER  NAUSEA AND VOMITING THAT IS NOT CONTROLLED WITH YOUR NAUSEA MEDICATION  *UNUSUAL SHORTNESS OF BREATH  *UNUSUAL BRUISING OR BLEEDING  TENDERNESS IN MOUTH AND THROAT WITH OR WITHOUT PRESENCE OF ULCERS  *URINARY PROBLEMS  *BOWEL PROBLEMS  UNUSUAL RASH Items with * indicate a potential emergency and should be followed up as soon as possible.  Feel free to call the clinic you have any questions or concerns. The clinic phone number is [phone].

## 2014-04-18 NOTE — Patient Instructions (Signed)
Smoking Cessation Quitting smoking is important to your health and has many advantages. However, it is not always easy to quit since nicotine is a very addictive drug. Oftentimes, people try 3 times or more before being able to quit. This document explains the best ways for you to prepare to quit smoking. Quitting takes hard work and a lot of effort, but you can do it. ADVANTAGES OF QUITTING SMOKING  You will live longer, feel better, and live better.  Your body will feel the impact of quitting smoking almost immediately.  Within 20 minutes, blood pressure decreases. Your pulse returns to its normal level.  After 8 hours, carbon monoxide levels in the blood return to normal. Your oxygen level increases.  After 24 hours, the chance of having a heart attack starts to decrease. Your breath, hair, and body stop smelling like smoke.  After 48 hours, damaged nerve endings begin to recover. Your sense of taste and smell improve.  After 72 hours, the body is virtually free of nicotine. Your bronchial tubes relax and breathing becomes easier.  After 2 to 12 weeks, lungs can hold more air. Exercise becomes easier and circulation improves.  The risk of having a heart attack, stroke, cancer, or lung disease is greatly reduced.  After 1 year, the risk of coronary heart disease is cut in half.  After 5 years, the risk of stroke falls to the same as a nonsmoker.  After 10 years, the risk of lung cancer is cut in half and the risk of other cancers decreases significantly.  After 15 years, the risk of coronary heart disease drops, usually to the level of a nonsmoker.  If you are pregnant, quitting smoking will improve your chances of having a healthy baby.  The people you live with, especially any children, will be healthier.  You will have extra money to spend on things other than cigarettes. QUESTIONS TO THINK ABOUT BEFORE ATTEMPTING TO QUIT You may want to talk about your answers with your  health care provider.  Why do you want to quit?  If you tried to quit in the past, what helped and what did not?  What will be the most difficult situations for you after you quit? How will you plan to handle them?  Who can help you through the tough times? Your family? Friends? A health care provider?  What pleasures do you get from smoking? What ways can you still get pleasure if you quit? Here are some questions to ask your health care provider:  How can you help me to be successful at quitting?  What medicine do you think would be best for me and how should I take it?  What should I do if I need more help?  What is smoking withdrawal like? How can I get information on withdrawal? GET READY  Set a quit date.  Change your environment by getting rid of all cigarettes, ashtrays, matches, and lighters in your home, car, or work. Do not let people smoke in your home.  Review your past attempts to quit. Think about what worked and what did not. GET SUPPORT AND ENCOURAGEMENT You have a better chance of being successful if you have help. You can get support in many ways.  Tell your family, friends, and coworkers that you are going to quit and need their support. Ask them not to smoke around you.  Get individual, group, or telephone counseling and support. Programs are available at local hospitals and health centers. Call   your local health department for information about programs in your area.  Spiritual beliefs and practices may help some smokers quit.  Download a "quit meter" on your computer to keep track of quit statistics, such as how long you have gone without smoking, cigarettes not smoked, and money saved.  Get a self-help book about quitting smoking and staying off tobacco. LEARN NEW SKILLS AND BEHAVIORS  Distract yourself from urges to smoke. Talk to someone, go for a walk, or occupy your time with a task.  Change your normal routine. Take a different route to work.  Drink tea instead of coffee. Eat breakfast in a different place.  Reduce your stress. Take a hot bath, exercise, or read a book.  Plan something enjoyable to do every day. Reward yourself for not smoking.  Explore interactive web-based programs that specialize in helping you quit. GET MEDICINE AND USE IT CORRECTLY Medicines can help you stop smoking and decrease the urge to smoke. Combining medicine with the above behavioral methods and support can greatly increase your chances of successfully quitting smoking.  Nicotine replacement therapy helps deliver nicotine to your body without the negative effects and risks of smoking. Nicotine replacement therapy includes nicotine gum, lozenges, inhalers, nasal sprays, and skin patches. Some may be available over-the-counter and others require a prescription.  Antidepressant medicine helps people abstain from smoking, but how this works is unknown. This medicine is available by prescription.  Nicotinic receptor partial agonist medicine simulates the effect of nicotine in your brain. This medicine is available by prescription. Ask your health care provider for advice about which medicines to use and how to use them based on your health history. Your health care provider will tell you what side effects to look out for if you choose to be on a medicine or therapy. Carefully read the information on the package. Do not use any other product containing nicotine while using a nicotine replacement product.  RELAPSE OR DIFFICULT SITUATIONS Most relapses occur within the first 3 months after quitting. Do not be discouraged if you start smoking again. Remember, most people try several times before finally quitting. You may have symptoms of withdrawal because your body is used to nicotine. You may crave cigarettes, be irritable, feel very hungry, cough often, get headaches, or have difficulty concentrating. The withdrawal symptoms are only temporary. They are strongest  when you first quit, but they will go away within 10-14 days. To reduce the chances of relapse, try to:  Avoid drinking alcohol. Drinking lowers your chances of successfully quitting.  Reduce the amount of caffeine you consume. Once you quit smoking, the amount of caffeine in your body increases and can give you symptoms, such as a rapid heartbeat, sweating, and anxiety.  Avoid smokers because they can make you want to smoke.  Do not let weight gain distract you. Many smokers will gain weight when they quit, usually less than 10 pounds. Eat a healthy diet and stay active. You can always lose the weight gained after you quit.  Find ways to improve your mood other than smoking. FOR MORE INFORMATION  www.smokefree.gov  Document Released: 05/20/2001 Document Revised: 10/10/2013 Document Reviewed: 09/04/2011 [employer] Patient Information 2015 [employer]. This information is not intended to replace advice given to you by your health care provider. Make sure you discuss any questions you have with your health care provider.  

## 2014-04-18 NOTE — Telephone Encounter (Signed)
gv and printed appt scheda nd avs for pt for NOV adn DEC
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
## 2014-04-18 NOTE — Patient Instructions (Signed)

## 2014-04-24 ENCOUNTER — Other Ambulatory Visit: Payer: Self-pay | Admitting: Medical Oncology

## 2014-04-24 ENCOUNTER — Telehealth: Payer: Self-pay | Admitting: Medical Oncology

## 2014-04-24 DIAGNOSIS — Z23 Encounter for immunization: Secondary | ICD-10-CM

## 2014-04-24 DIAGNOSIS — C7949 Secondary malignant neoplasm of other parts of nervous system: Secondary | ICD-10-CM

## 2014-04-24 DIAGNOSIS — C3490 Malignant neoplasm of unspecified part of unspecified bronchus or lung: Secondary | ICD-10-CM

## 2014-04-24 MED ORDER — OXYCODONE HCL 10 MG PO TABS: 10.0000 mg | ORAL_TABLET | ORAL | Status: DC | PRN

## 2014-04-24 NOTE — Telephone Encounter (Signed)
Pts mother called back and was told pt has rotator cuff injury and was told it may heal on its own or it can be injected with steroid if [facility] approves.
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
## 2014-04-26 ENCOUNTER — Encounter: Payer: Self-pay | Admitting: Internal Medicine

## 2014-04-27 ENCOUNTER — Other Ambulatory Visit: Payer: Self-pay | Admitting: Medical Oncology

## 2014-04-27 DIAGNOSIS — C7951 Secondary malignant neoplasm of bone: Secondary | ICD-10-CM

## 2014-04-27 DIAGNOSIS — C342 Malignant neoplasm of middle lobe, bronchus or lung: Secondary | ICD-10-CM

## 2014-04-27 MED ORDER — OXYCODONE HCL ER 60 MG PO T12A
EXTENDED_RELEASE_TABLET | ORAL | Status: DC
Start: 2014-04-27 — End: 2014-05-26

## 2014-04-27 NOTE — Progress Notes (Signed)
rx locked up in injection room.

## 2014-05-01 ENCOUNTER — Other Ambulatory Visit: Payer: Self-pay | Admitting: *Deleted

## 2014-05-01 DIAGNOSIS — Z23 Encounter for immunization: Secondary | ICD-10-CM

## 2014-05-01 DIAGNOSIS — C7949 Secondary malignant neoplasm of other parts of nervous system: Secondary | ICD-10-CM

## 2014-05-01 DIAGNOSIS — C3490 Malignant neoplasm of unspecified part of unspecified bronchus or lung: Secondary | ICD-10-CM

## 2014-05-01 MED ORDER — OXYCODONE HCL 10 MG PO TABS: 10.0000 mg | ORAL_TABLET | ORAL | Status: DC | PRN

## 2014-05-05 ENCOUNTER — Other Ambulatory Visit: Payer: Self-pay | Admitting: *Deleted

## 2014-05-05 ENCOUNTER — Telehealth (HOSPITAL_COMMUNITY): Payer: Self-pay | Admitting: Interventional Radiology

## 2014-05-05 NOTE — Telephone Encounter (Signed)
Called and spoke with patient's mother concerning [name] [name] coming in for a f/u visit following his sacroplasty on 113/15. She states that the pt has had good results from the actual sacroplasty but his health continues to decline due to the lung cancer. The patient does not want to come in for a follow-up clinical visit as he is tired of having to go to all of these doctor's appointments. The pt's mother states she will talk to Dr. Julien Nordmann and call us back if they decide to come back in for a follow-up or if new sx develop. JM

## 2014-05-08 ENCOUNTER — Other Ambulatory Visit: Payer: Self-pay | Admitting: *Deleted

## 2014-05-08 DIAGNOSIS — C7949 Secondary malignant neoplasm of other parts of nervous system: Secondary | ICD-10-CM

## 2014-05-08 DIAGNOSIS — Z23 Encounter for immunization: Secondary | ICD-10-CM

## 2014-05-08 DIAGNOSIS — C3490 Malignant neoplasm of unspecified part of unspecified bronchus or lung: Secondary | ICD-10-CM

## 2014-05-08 MED ORDER — OXYCODONE HCL 10 MG PO TABS: 10.0000 mg | ORAL_TABLET | ORAL | Status: DC | PRN

## 2014-05-09 ENCOUNTER — Other Ambulatory Visit (HOSPITAL_BASED_OUTPATIENT_CLINIC_OR_DEPARTMENT_OTHER): Payer: Medicare Other

## 2014-05-09 ENCOUNTER — Ambulatory Visit: Payer: Medicare Other

## 2014-05-09 ENCOUNTER — Encounter: Payer: Self-pay | Admitting: Physician Assistant

## 2014-05-09 ENCOUNTER — Ambulatory Visit (HOSPITAL_BASED_OUTPATIENT_CLINIC_OR_DEPARTMENT_OTHER): Payer: Medicare Other | Admitting: Physician Assistant

## 2014-05-09 ENCOUNTER — Ambulatory Visit (HOSPITAL_BASED_OUTPATIENT_CLINIC_OR_DEPARTMENT_OTHER): Payer: Medicare Other

## 2014-05-09 ENCOUNTER — Telehealth: Payer: Self-pay | Admitting: Internal Medicine

## 2014-05-09 VITALS — BP 120/68 | HR 116 | Temp 98.3°F | Resp 18 | Ht 69.0 in | Wt 200.3 lb

## 2014-05-09 DIAGNOSIS — C7951 Secondary malignant neoplasm of bone: Secondary | ICD-10-CM

## 2014-05-09 DIAGNOSIS — Z95828 Presence of other vascular implants and grafts: Secondary | ICD-10-CM

## 2014-05-09 DIAGNOSIS — Z5111 Encounter for antineoplastic chemotherapy: Secondary | ICD-10-CM

## 2014-05-09 DIAGNOSIS — C342 Malignant neoplasm of middle lobe, bronchus or lung: Secondary | ICD-10-CM

## 2014-05-09 DIAGNOSIS — G893 Neoplasm related pain (acute) (chronic): Secondary | ICD-10-CM

## 2014-05-09 LAB — CBC WITH DIFFERENTIAL/PLATELET
BASO%: 0.4 % (ref 0.0–2.0)
Basophils Absolute: 0 10*3/uL (ref 0.0–0.1)
EOS%: 12.2 % — AB (ref 0.0–7.0)
Eosinophils Absolute: 1.6 10*3/uL — ABNORMAL HIGH (ref 0.0–0.5)
HCT: 35.4 % — ABNORMAL LOW (ref 38.4–49.9)
HGB: 11.2 g/dL — ABNORMAL LOW (ref 13.0–17.1)
LYMPH#: 0.8 10*3/uL — AB (ref 0.9–3.3)
LYMPH%: 6.5 % — ABNORMAL LOW (ref 14.0–49.0)
MCH: 31.9 pg (ref 27.2–33.4)
MCHC: 31.6 g/dL — AB (ref 32.0–36.0)
MCV: 100.9 fL — AB (ref 79.3–98.0)
MONO#: 1.2 10*3/uL — ABNORMAL HIGH (ref 0.1–0.9)
MONO%: 9.2 % (ref 0.0–14.0)
NEUT#: 9.4 10*3/uL — ABNORMAL HIGH (ref 1.5–6.5)
NEUT%: 71.7 % (ref 39.0–75.0)
Platelets: 178 10*3/uL (ref 140–400)
RBC: 3.51 10*6/uL — AB (ref 4.20–5.82)
RDW: 20.7 % — AB (ref 11.0–14.6)
WBC: 13.1 10*3/uL — ABNORMAL HIGH (ref 4.0–10.3)

## 2014-05-09 LAB — COMPREHENSIVE METABOLIC PANEL (CC13)
ALBUMIN: 3.2 g/dL — AB (ref 3.5–5.0)
ALT: 23 U/L (ref 0–55)
AST: 14 U/L (ref 5–34)
Alkaline Phosphatase: 202 U/L — ABNORMAL HIGH (ref 40–150)
Anion Gap: 10 mEq/L (ref 3–11)
BUN: 6 mg/dL — ABNORMAL LOW (ref 7.0–26.0)
CHLORIDE: 102 meq/L (ref 98–109)
CO2: 26 mEq/L (ref 22–29)
Calcium: 9.2 mg/dL (ref 8.4–10.4)
Creatinine: 0.7 mg/dL (ref 0.7–1.3)
Glucose: 110 mg/dl (ref 70–140)
POTASSIUM: 3.7 meq/L (ref 3.5–5.1)
SODIUM: 138 meq/L (ref 136–145)
Total Bilirubin: 0.55 mg/dL (ref 0.20–1.20)
Total Protein: 7.7 g/dL (ref 6.4–8.3)

## 2014-05-09 MED ORDER — HEPARIN SOD (PORK) LOCK FLUSH 100 UNIT/ML IV SOLN
500.0000 [IU] | Freq: Once | INTRAVENOUS | Status: AC | PRN
  Administered 2014-05-09: 500 [IU]
  Filled 2014-05-09: qty 5

## 2014-05-09 MED ORDER — SODIUM CHLORIDE 0.9 % IV SOLN
500.0000 mg/m2 | Freq: Once | INTRAVENOUS | Status: AC
  Administered 2014-05-09: 1075 mg via INTRAVENOUS
  Filled 2014-05-09: qty 43

## 2014-05-09 MED ORDER — DEXAMETHASONE SODIUM PHOSPHATE 10 MG/ML IJ SOLN
INTRAMUSCULAR | Status: AC
  Filled 2014-05-09: qty 1

## 2014-05-09 MED ORDER — HYDROMORPHONE HCL 1 MG/ML IJ SOLN
2.0000 mg | Freq: Once | INTRAMUSCULAR | Status: AC
  Administered 2014-05-09: 2 mg via INTRAVENOUS
  Filled 2014-05-09: qty 2

## 2014-05-09 MED ORDER — SODIUM CHLORIDE 0.9 % IV SOLN
Freq: Once | INTRAVENOUS | Status: AC
  Administered 2014-05-09: 12:00:00 via INTRAVENOUS

## 2014-05-09 MED ORDER — HYDROMORPHONE HCL 4 MG/ML IJ SOLN
INTRAMUSCULAR | Status: AC
  Filled 2014-05-09: qty 1

## 2014-05-09 MED ORDER — ONDANSETRON 8 MG/50ML IVPB (CHCC)
8.0000 mg | Freq: Once | INTRAVENOUS | Status: AC
  Administered 2014-05-09: 8 mg via INTRAVENOUS

## 2014-05-09 MED ORDER — SODIUM CHLORIDE 0.9 % IJ SOLN
10.0000 mL | INTRAMUSCULAR | Status: DC | PRN
  Administered 2014-05-09: 10 mL
  Filled 2014-05-09: qty 10

## 2014-05-09 MED ORDER — ONDANSETRON 8 MG/NS 50 ML IVPB
INTRAVENOUS | Status: AC
  Filled 2014-05-09: qty 8

## 2014-05-09 MED ORDER — DEXAMETHASONE SODIUM PHOSPHATE 10 MG/ML IJ SOLN
10.0000 mg | Freq: Once | INTRAMUSCULAR | Status: AC
  Administered 2014-05-09: 10 mg via INTRAVENOUS

## 2014-05-09 MED ORDER — SODIUM CHLORIDE 0.9 % IJ SOLN
10.0000 mL | INTRAMUSCULAR | Status: DC | PRN
  Administered 2014-05-09: 10 mL via INTRAVENOUS
  Filled 2014-05-09: qty 10

## 2014-05-09 MED ORDER — DENOSUMAB 120 MG/1.7ML ~~LOC~~ SOLN
120.0000 mg | Freq: Once | SUBCUTANEOUS | Status: AC
  Administered 2014-05-09: 120 mg via SUBCUTANEOUS
  Filled 2014-05-09: qty 1.7

## 2014-05-09 NOTE — Patient Instructions (Signed)
Follow up in 3 weeks, prior to your next cycle of chemotherapy

## 2014-05-09 NOTE — Patient Instructions (Signed)
[facility] Discharge Instructions for Patients Receiving Chemotherapy  Today you received the following chemotherapy agents Alimta, Xgeva  To help prevent nausea and vomiting after your treatment, we encourage you to take your nausea medication   If you develop nausea and vomiting that is not controlled by your nausea medication, call the clinic.   BELOW ARE SYMPTOMS THAT SHOULD BE REPORTED IMMEDIATELY:  *FEVER GREATER THAN 100.5 F  *CHILLS WITH OR WITHOUT FEVER  NAUSEA AND VOMITING THAT IS NOT CONTROLLED WITH YOUR NAUSEA MEDICATION  *UNUSUAL SHORTNESS OF BREATH  *UNUSUAL BRUISING OR BLEEDING  TENDERNESS IN MOUTH AND THROAT WITH OR WITHOUT PRESENCE OF ULCERS  *URINARY PROBLEMS  *BOWEL PROBLEMS  UNUSUAL RASH Items with * indicate a potential emergency and should be followed up as soon as possible.  Feel free to call the clinic you have any questions or concerns. The clinic phone number is [phone].

## 2014-05-09 NOTE — Patient Instructions (Signed)
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
## 2014-05-09 NOTE — Telephone Encounter (Signed)
gv adn printed appt sched adn avs for pt for Dec...sed added tx.

## 2014-05-12 ENCOUNTER — Other Ambulatory Visit: Payer: Self-pay | Admitting: *Deleted

## 2014-05-12 DIAGNOSIS — C7949 Secondary malignant neoplasm of other parts of nervous system: Secondary | ICD-10-CM

## 2014-05-12 DIAGNOSIS — C3490 Malignant neoplasm of unspecified part of unspecified bronchus or lung: Secondary | ICD-10-CM

## 2014-05-12 DIAGNOSIS — Z23 Encounter for immunization: Secondary | ICD-10-CM

## 2014-05-12 MED ORDER — OXYCODONE HCL 10 MG PO TABS: 10.0000 mg | ORAL_TABLET | ORAL | Status: DC | PRN

## 2014-05-19 ENCOUNTER — Other Ambulatory Visit: Payer: Self-pay | Admitting: Medical Oncology

## 2014-05-19 DIAGNOSIS — C3490 Malignant neoplasm of unspecified part of unspecified bronchus or lung: Secondary | ICD-10-CM

## 2014-05-19 DIAGNOSIS — C7949 Secondary malignant neoplasm of other parts of nervous system: Secondary | ICD-10-CM

## 2014-05-19 DIAGNOSIS — Z23 Encounter for immunization: Secondary | ICD-10-CM

## 2014-05-19 MED ORDER — OXYCODONE HCL 10 MG PO TABS: 10.0000 mg | ORAL_TABLET | ORAL | Status: DC | PRN

## 2014-05-19 NOTE — Progress Notes (Signed)
Pt mother requested oxycodone refill. Rx locked up in injection room. She said she will pick it up Monday. 12/14

## 2014-05-26 ENCOUNTER — Other Ambulatory Visit: Payer: Self-pay | Admitting: *Deleted

## 2014-05-26 DIAGNOSIS — C342 Malignant neoplasm of middle lobe, bronchus or lung: Secondary | ICD-10-CM

## 2014-05-26 DIAGNOSIS — Z23 Encounter for immunization: Secondary | ICD-10-CM

## 2014-05-26 DIAGNOSIS — C7951 Secondary malignant neoplasm of bone: Secondary | ICD-10-CM

## 2014-05-26 DIAGNOSIS — C3490 Malignant neoplasm of unspecified part of unspecified bronchus or lung: Secondary | ICD-10-CM

## 2014-05-26 DIAGNOSIS — C7949 Secondary malignant neoplasm of other parts of nervous system: Secondary | ICD-10-CM

## 2014-05-26 MED ORDER — OXYCODONE HCL 10 MG PO TABS: 10.0000 mg | ORAL_TABLET | ORAL | Status: DC | PRN

## 2014-05-26 MED ORDER — OXYCODONE HCL ER 60 MG PO T12A: EXTENDED_RELEASE_TABLET | ORAL | Status: DC

## 2014-05-30 ENCOUNTER — Ambulatory Visit: Payer: Self-pay

## 2014-05-30 ENCOUNTER — Ambulatory Visit: Payer: Self-pay | Admitting: Physician Assistant

## 2014-05-30 ENCOUNTER — Other Ambulatory Visit: Payer: Self-pay

## 2014-05-30 ENCOUNTER — Ambulatory Visit (HOSPITAL_BASED_OUTPATIENT_CLINIC_OR_DEPARTMENT_OTHER): Payer: Medicare Other

## 2014-05-30 ENCOUNTER — Telehealth: Payer: Self-pay | Admitting: Internal Medicine

## 2014-05-30 ENCOUNTER — Ambulatory Visit: Payer: Medicare Other

## 2014-05-30 ENCOUNTER — Ambulatory Visit (HOSPITAL_BASED_OUTPATIENT_CLINIC_OR_DEPARTMENT_OTHER): Payer: Medicare Other | Admitting: Internal Medicine

## 2014-05-30 ENCOUNTER — Encounter: Payer: Self-pay | Admitting: Internal Medicine

## 2014-05-30 ENCOUNTER — Ambulatory Visit (HOSPITAL_BASED_OUTPATIENT_CLINIC_OR_DEPARTMENT_OTHER): Payer: Medicare Other | Admitting: Lab

## 2014-05-30 VITALS — BP 111/75 | HR 107 | Temp 98.3°F | Resp 19 | Ht 69.0 in | Wt 202.3 lb

## 2014-05-30 DIAGNOSIS — Z720 Tobacco use: Secondary | ICD-10-CM

## 2014-05-30 DIAGNOSIS — G893 Neoplasm related pain (acute) (chronic): Secondary | ICD-10-CM

## 2014-05-30 DIAGNOSIS — C342 Malignant neoplasm of middle lobe, bronchus or lung: Secondary | ICD-10-CM

## 2014-05-30 DIAGNOSIS — C7951 Secondary malignant neoplasm of bone: Secondary | ICD-10-CM

## 2014-05-30 DIAGNOSIS — C7949 Secondary malignant neoplasm of other parts of nervous system: Secondary | ICD-10-CM

## 2014-05-30 DIAGNOSIS — Z95828 Presence of other vascular implants and grafts: Secondary | ICD-10-CM

## 2014-05-30 DIAGNOSIS — F17200 Nicotine dependence, unspecified, uncomplicated: Secondary | ICD-10-CM

## 2014-05-30 DIAGNOSIS — M549 Dorsalgia, unspecified: Secondary | ICD-10-CM

## 2014-05-30 DIAGNOSIS — Z5111 Encounter for antineoplastic chemotherapy: Secondary | ICD-10-CM

## 2014-05-30 DIAGNOSIS — G8929 Other chronic pain: Secondary | ICD-10-CM

## 2014-05-30 LAB — CBC WITH DIFFERENTIAL/PLATELET
BASO%: 1.1 % (ref 0.0–2.0)
BASOS ABS: 0.1 10*3/uL (ref 0.0–0.1)
EOS ABS: 1.7 10*3/uL — AB (ref 0.0–0.5)
EOS%: 15 % — ABNORMAL HIGH (ref 0.0–7.0)
HCT: 36 % — ABNORMAL LOW (ref 38.4–49.9)
HEMOGLOBIN: 11.4 g/dL — AB (ref 13.0–17.1)
LYMPH%: 7.8 % — ABNORMAL LOW (ref 14.0–49.0)
MCH: 31.7 pg (ref 27.2–33.4)
MCHC: 31.7 g/dL — ABNORMAL LOW (ref 32.0–36.0)
MCV: 100 fL — ABNORMAL HIGH (ref 79.3–98.0)
MONO#: 1 10*3/uL — AB (ref 0.1–0.9)
MONO%: 8.5 % (ref 0.0–14.0)
NEUT%: 67.6 % (ref 39.0–75.0)
NEUTROS ABS: 7.9 10*3/uL — AB (ref 1.5–6.5)
PLATELETS: 196 10*3/uL (ref 140–400)
RBC: 3.6 10*6/uL — ABNORMAL LOW (ref 4.20–5.82)
RDW: 20.1 % — AB (ref 11.0–14.6)
WBC: 11.7 10*3/uL — ABNORMAL HIGH (ref 4.0–10.3)
lymph#: 0.9 10*3/uL (ref 0.9–3.3)

## 2014-05-30 LAB — COMPREHENSIVE METABOLIC PANEL (CC13)
ALK PHOS: 197 U/L — AB (ref 40–150)
ALT: 23 U/L (ref 0–55)
ANION GAP: 10 meq/L (ref 3–11)
AST: 14 U/L (ref 5–34)
Albumin: 3.3 g/dL — ABNORMAL LOW (ref 3.5–5.0)
BUN: 7.8 mg/dL (ref 7.0–26.0)
CO2: 27 meq/L (ref 22–29)
Calcium: 8.8 mg/dL (ref 8.4–10.4)
Chloride: 101 mEq/L (ref 98–109)
Creatinine: 0.7 mg/dL (ref 0.7–1.3)
GLUCOSE: 117 mg/dL (ref 70–140)
POTASSIUM: 3.9 meq/L (ref 3.5–5.1)
Sodium: 139 mEq/L (ref 136–145)
Total Bilirubin: 0.42 mg/dL (ref 0.20–1.20)
Total Protein: 7.3 g/dL (ref 6.4–8.3)

## 2014-05-30 MED ORDER — HEPARIN SOD (PORK) LOCK FLUSH 100 UNIT/ML IV SOLN
500.0000 [IU] | Freq: Once | INTRAVENOUS | Status: AC | PRN
  Administered 2014-05-30: 500 [IU]
  Filled 2014-05-30: qty 5

## 2014-05-30 MED ORDER — SODIUM CHLORIDE 0.9 % IJ SOLN
10.0000 mL | INTRAMUSCULAR | Status: DC | PRN
  Administered 2014-05-30: 10 mL
  Filled 2014-05-30: qty 10

## 2014-05-30 MED ORDER — DEXAMETHASONE SODIUM PHOSPHATE 10 MG/ML IJ SOLN
10.0000 mg | Freq: Once | INTRAMUSCULAR | Status: AC
  Administered 2014-05-30: 10 mg via INTRAVENOUS

## 2014-05-30 MED ORDER — CYANOCOBALAMIN 1000 MCG/ML IJ SOLN
1000.0000 ug | Freq: Once | INTRAMUSCULAR | Status: AC
  Administered 2014-05-30: 1000 ug via INTRAMUSCULAR

## 2014-05-30 MED ORDER — HYDROMORPHONE HCL 4 MG/ML IJ SOLN
INTRAMUSCULAR | Status: AC
  Filled 2014-05-30: qty 1

## 2014-05-30 MED ORDER — SODIUM CHLORIDE 0.9 % IV SOLN
Freq: Once | INTRAVENOUS | Status: AC
  Administered 2014-05-30: 15:00:00 via INTRAVENOUS

## 2014-05-30 MED ORDER — SODIUM CHLORIDE 0.9 % IJ SOLN
10.0000 mL | INTRAMUSCULAR | Status: DC | PRN
  Administered 2014-05-30: 10 mL via INTRAVENOUS
  Filled 2014-05-30: qty 10

## 2014-05-30 MED ORDER — HYDROMORPHONE HCL 4 MG/ML IJ SOLN
4.0000 mg | Freq: Once | INTRAMUSCULAR | Status: AC
  Administered 2014-05-30: 4 mg via INTRAVENOUS

## 2014-05-30 MED ORDER — ONDANSETRON 8 MG/50ML IVPB (CHCC)
8.0000 mg | Freq: Once | INTRAVENOUS | Status: AC
  Administered 2014-05-30: 8 mg via INTRAVENOUS

## 2014-05-30 MED ORDER — SODIUM CHLORIDE 0.9 % IV SOLN
510.0000 mg/m2 | Freq: Once | INTRAVENOUS | Status: AC
  Administered 2014-05-30: 1100 mg via INTRAVENOUS
  Filled 2014-05-30: qty 44

## 2014-05-30 NOTE — Patient Instructions (Signed)
Smoking Cessation, Tips for Success  If you are ready to quit smoking, congratulations! You have chosen to help yourself be healthier. Cigarettes bring nicotine, tar, carbon monoxide, and other irritants into your body. Your lungs, heart, and blood vessels will be able to work better without these poisons. There are many different ways to quit smoking. Nicotine gum, nicotine patches, a nicotine inhaler, or nicotine nasal spray can help with physical craving. Hypnosis, support groups, and medicines help break the habit of smoking.  WHAT THINGS CAN I DO TO MAKE QUITTING EASIER?   Here are some tips to help you quit for good:  · Pick a date when you will quit smoking completely. Tell all of your friends and family about your plan to quit on that date.  · Do not try to slowly cut down on the number of cigarettes you are smoking. Pick a quit date and quit smoking completely starting on that day.  · Throw away all cigarettes.    · Clean and remove all ashtrays from your home, work, and car.  · On a card, write down your reasons for quitting. Carry the card with you and read it when you get the urge to smoke.  · Cleanse your body of nicotine. Drink enough water and fluids to keep your urine clear or pale yellow. Do this after quitting to flush the nicotine from your body.  · Learn to predict your moods. Do not let a bad situation be your excuse to have a cigarette. Some situations in your life might tempt you into wanting a cigarette.  · Never have "just one" cigarette. It leads to wanting another and another. Remind yourself of your decision to quit.  · Change habits associated with smoking. If you smoked while driving or when feeling stressed, try other activities to replace smoking. Stand up when drinking your coffee. Brush your teeth after eating. Sit in a different chair when you read the paper. Avoid alcohol while trying to quit, and try to drink fewer caffeinated beverages. Alcohol and caffeine may urge you to  smoke.  · Avoid foods and drinks that can trigger a desire to smoke, such as sugary or spicy foods and alcohol.  · Ask people who smoke not to smoke around you.  · Have something planned to do right after eating or having a cup of coffee. For example, plan to take a walk or exercise.  · Try a relaxation exercise to calm you down and decrease your stress. Remember, you may be tense and nervous for the first 2 weeks after you quit, but this will pass.  · Find new activities to keep your hands busy. Play with a pen, coin, or rubber band. Doodle or draw things on paper.  · Brush your teeth right after eating. This will help cut down on the craving for the taste of tobacco after meals. You can also try mouthwash.    · Use oral substitutes in place of cigarettes. Try using lemon drops, carrots, cinnamon sticks, or chewing gum. Keep them handy so they are available when you have the urge to smoke.  · When you have the urge to smoke, try deep breathing.  · Designate your home as a nonsmoking area.  · If you are a heavy smoker, ask your health care provider about a prescription for nicotine chewing gum. It can ease your withdrawal from nicotine.  · Reward yourself. Set aside the cigarette money you save and buy yourself something nice.  · Look for   support from others. Join a support group or smoking cessation program. Ask someone at home or at work to help you with your plan to quit smoking.  · Always ask yourself, "Do I need this cigarette or is this just a reflex?" Tell yourself, "Today, I choose not to smoke," or "I do not want to smoke." You are reminding yourself of your decision to quit.  · Do not replace cigarette smoking with electronic cigarettes (commonly called e-cigarettes). The safety of e-cigarettes is unknown, and some may contain harmful chemicals.  · If you relapse, do not give up! Plan ahead and think about what you will do the next time you get the urge to smoke.  HOW WILL I FEEL WHEN I QUIT SMOKING?  You  may have symptoms of withdrawal because your body is used to nicotine (the addictive substance in cigarettes). You may crave cigarettes, be irritable, feel very hungry, cough often, get headaches, or have difficulty concentrating. The withdrawal symptoms are only temporary. They are strongest when you first quit but will go away within 10-14 days. When withdrawal symptoms occur, stay in control. Think about your reasons for quitting. Remind yourself that these are signs that your body is healing and getting used to being without cigarettes. Remember that withdrawal symptoms are easier to treat than the major diseases that smoking can cause.   Even after the withdrawal is over, expect periodic urges to smoke. However, these cravings are generally short lived and will go away whether you smoke or not. Do not smoke!  WHAT RESOURCES ARE AVAILABLE TO HELP ME QUIT SMOKING?  Your health care provider can direct you to community resources or hospitals for support, which may include:  · Group support.  · Education.  · Hypnosis.  · Therapy.  Document Released: 02/22/2004 Document Revised: 10/10/2013 Document Reviewed: 11/11/2012  [employer]® Patient Information ©2015 [employer]. This information is not intended to replace advice given to you by your health care provider. Make sure you discuss any questions you have with your health care provider.
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
## 2014-05-30 NOTE — Patient Instructions (Signed)
[place] Discharge Instructions for Patients Receiving Chemotherapy  Today you received the following chemotherapy agents: Alimta  To help prevent nausea and vomiting after your treatment, we encourage you to take your nausea medication as prescribed by your physician.   If you develop nausea and vomiting that is not controlled by your nausea medication, call the clinic.   BELOW ARE SYMPTOMS THAT SHOULD BE REPORTED IMMEDIATELY:  *FEVER GREATER THAN 100.5 F  *CHILLS WITH OR WITHOUT FEVER  NAUSEA AND VOMITING THAT IS NOT CONTROLLED WITH YOUR NAUSEA MEDICATION  *UNUSUAL SHORTNESS OF BREATH  *UNUSUAL BRUISING OR BLEEDING  TENDERNESS IN MOUTH AND THROAT WITH OR WITHOUT PRESENCE OF ULCERS  *URINARY PROBLEMS  *BOWEL PROBLEMS  UNUSUAL RASH Items with * indicate a potential emergency and should be followed up as soon as possible.  Feel free to call the clinic you have any questions or concerns. The clinic phone number is [phone].

## 2014-05-30 NOTE — Progress Notes (Signed)
Pt states 10/10 pain.  Pt states "I cannot live like this, I want hospice to help control the pain".  Informed Dr Vista Mink.

## 2014-05-30 NOTE — Patient Instructions (Signed)

## 2014-05-30 NOTE — Telephone Encounter (Signed)
gv pt mom appt schedule for jan/feb and prep for ct scan already on schedule 06/19/14.

## 2014-06-01 ENCOUNTER — Other Ambulatory Visit: Payer: Self-pay | Admitting: Medical Oncology

## 2014-06-01 DIAGNOSIS — Z23 Encounter for immunization: Secondary | ICD-10-CM

## 2014-06-01 DIAGNOSIS — C7949 Secondary malignant neoplasm of other parts of nervous system: Secondary | ICD-10-CM

## 2014-06-01 DIAGNOSIS — C3490 Malignant neoplasm of unspecified part of unspecified bronchus or lung: Secondary | ICD-10-CM

## 2014-06-01 MED ORDER — OXYCODONE HCL 10 MG PO TABS: 10.0000 mg | ORAL_TABLET | ORAL | Status: DC | PRN

## 2014-06-01 NOTE — Progress Notes (Signed)
Mother requested refill for pt oxycodone. RX locked in injection room

## 2014-06-08 ENCOUNTER — Other Ambulatory Visit: Payer: Self-pay | Admitting: *Deleted

## 2014-06-08 DIAGNOSIS — C7949 Secondary malignant neoplasm of other parts of nervous system: Secondary | ICD-10-CM

## 2014-06-08 DIAGNOSIS — Z23 Encounter for immunization: Secondary | ICD-10-CM

## 2014-06-08 DIAGNOSIS — C3490 Malignant neoplasm of unspecified part of unspecified bronchus or lung: Secondary | ICD-10-CM

## 2014-06-08 MED ORDER — OXYCODONE HCL 10 MG PO TABS: 10.0000 mg | ORAL_TABLET | ORAL | Status: DC | PRN

## 2014-06-13 ENCOUNTER — Other Ambulatory Visit: Payer: Self-pay

## 2014-06-13 ENCOUNTER — Other Ambulatory Visit: Payer: Self-pay | Admitting: Internal Medicine

## 2014-06-13 MED ORDER — PREGABALIN 50 MG PO CAPS: 50.0000 mg | ORAL_CAPSULE | Freq: Three times a day (TID) | ORAL | Status: AC

## 2014-06-13 NOTE — Telephone Encounter (Signed)
#  90 , RX 3

## 2014-06-13 NOTE — Telephone Encounter (Signed)
lyrica has been called to [facility] in [place]

## 2014-06-15 ENCOUNTER — Other Ambulatory Visit: Payer: Self-pay | Admitting: *Deleted

## 2014-06-15 DIAGNOSIS — C7949 Secondary malignant neoplasm of other parts of nervous system: Secondary | ICD-10-CM

## 2014-06-15 DIAGNOSIS — C3490 Malignant neoplasm of unspecified part of unspecified bronchus or lung: Secondary | ICD-10-CM

## 2014-06-15 DIAGNOSIS — Z23 Encounter for immunization: Secondary | ICD-10-CM

## 2014-06-15 MED ORDER — OXYCODONE HCL 10 MG PO TABS: 10.0000 mg | ORAL_TABLET | ORAL | Status: DC | PRN

## 2014-06-19 ENCOUNTER — Other Ambulatory Visit: Payer: Self-pay | Admitting: *Deleted

## 2014-06-19 ENCOUNTER — Encounter (HOSPITAL_COMMUNITY): Payer: Self-pay

## 2014-06-19 ENCOUNTER — Ambulatory Visit (HOSPITAL_COMMUNITY)
Admission: RE | Admit: 2014-06-19 | Discharge: 2014-06-19 | Disposition: A | Discharge status: Home / Self Care | Payer: Medicare Other | Source: Ambulatory Visit | Attending: Internal Medicine | Admitting: Internal Medicine

## 2014-06-19 ENCOUNTER — Other Ambulatory Visit: Payer: Self-pay | Admitting: Medical Oncology

## 2014-06-19 DIAGNOSIS — C7949 Secondary malignant neoplasm of other parts of nervous system: Secondary | ICD-10-CM

## 2014-06-19 DIAGNOSIS — C7951 Secondary malignant neoplasm of bone: Secondary | ICD-10-CM

## 2014-06-19 DIAGNOSIS — C3490 Malignant neoplasm of unspecified part of unspecified bronchus or lung: Secondary | ICD-10-CM | POA: Diagnosis not present

## 2014-06-19 DIAGNOSIS — C342 Malignant neoplasm of middle lobe, bronchus or lung: Secondary | ICD-10-CM

## 2014-06-19 DIAGNOSIS — M79604 Pain in right leg: Secondary | ICD-10-CM | POA: Diagnosis not present

## 2014-06-19 DIAGNOSIS — M549 Dorsalgia, unspecified: Secondary | ICD-10-CM | POA: Insufficient documentation

## 2014-06-19 DIAGNOSIS — R918 Other nonspecific abnormal finding of lung field: Secondary | ICD-10-CM | POA: Diagnosis not present

## 2014-06-19 DIAGNOSIS — Z9221 Personal history of antineoplastic chemotherapy: Secondary | ICD-10-CM | POA: Insufficient documentation

## 2014-06-19 MED ORDER — IOHEXOL 300 MG/ML  SOLN
100.0000 mL | Freq: Once | INTRAMUSCULAR | Status: AC | PRN
  Administered 2014-06-19: 100 mL via INTRAVENOUS

## 2014-06-19 MED ORDER — IOHEXOL 300 MG/ML  SOLN
25.0000 mL | Freq: Once | INTRAMUSCULAR | Status: AC | PRN
  Administered 2014-06-19: 25 mL via ORAL

## 2014-06-19 MED ORDER — OXYCODONE HCL ER 60 MG PO T12A: EXTENDED_RELEASE_TABLET | ORAL | Status: DC

## 2014-06-20 ENCOUNTER — Other Ambulatory Visit: Payer: Self-pay

## 2014-06-20 ENCOUNTER — Other Ambulatory Visit (HOSPITAL_BASED_OUTPATIENT_CLINIC_OR_DEPARTMENT_OTHER): Payer: Medicare Other

## 2014-06-20 ENCOUNTER — Ambulatory Visit (HOSPITAL_BASED_OUTPATIENT_CLINIC_OR_DEPARTMENT_OTHER): Payer: Medicare Other | Admitting: Physician Assistant

## 2014-06-20 ENCOUNTER — Encounter: Payer: Self-pay | Admitting: Physician Assistant

## 2014-06-20 ENCOUNTER — Ambulatory Visit: Payer: Self-pay

## 2014-06-20 ENCOUNTER — Ambulatory Visit (HOSPITAL_BASED_OUTPATIENT_CLINIC_OR_DEPARTMENT_OTHER): Payer: Medicare Other

## 2014-06-20 ENCOUNTER — Telehealth: Payer: Self-pay | Admitting: Physician Assistant

## 2014-06-20 VITALS — BP 122/74 | HR 120 | Temp 98.1°F | Resp 18 | Ht 69.0 in | Wt 203.8 lb

## 2014-06-20 DIAGNOSIS — Z23 Encounter for immunization: Secondary | ICD-10-CM

## 2014-06-20 DIAGNOSIS — C342 Malignant neoplasm of middle lobe, bronchus or lung: Secondary | ICD-10-CM

## 2014-06-20 DIAGNOSIS — C3490 Malignant neoplasm of unspecified part of unspecified bronchus or lung: Secondary | ICD-10-CM

## 2014-06-20 DIAGNOSIS — Z95828 Presence of other vascular implants and grafts: Secondary | ICD-10-CM

## 2014-06-20 DIAGNOSIS — C7931 Secondary malignant neoplasm of brain: Secondary | ICD-10-CM

## 2014-06-20 DIAGNOSIS — C7951 Secondary malignant neoplasm of bone: Secondary | ICD-10-CM

## 2014-06-20 DIAGNOSIS — C7949 Secondary malignant neoplasm of other parts of nervous system: Secondary | ICD-10-CM

## 2014-06-20 LAB — CBC WITH DIFFERENTIAL/PLATELET
BASO%: 0.4 % (ref 0.0–2.0)
BASOS ABS: 0.1 10*3/uL (ref 0.0–0.1)
EOS%: 12.3 % — ABNORMAL HIGH (ref 0.0–7.0)
Eosinophils Absolute: 1.7 10*3/uL — ABNORMAL HIGH (ref 0.0–0.5)
HCT: 36.7 % — ABNORMAL LOW (ref 38.4–49.9)
HEMOGLOBIN: 11.6 g/dL — AB (ref 13.0–17.1)
LYMPH%: 7.3 % — AB (ref 14.0–49.0)
MCH: 32.1 pg (ref 27.2–33.4)
MCHC: 31.6 g/dL — ABNORMAL LOW (ref 32.0–36.0)
MCV: 101.7 fL — AB (ref 79.3–98.0)
MONO#: 1.2 10*3/uL — ABNORMAL HIGH (ref 0.1–0.9)
MONO%: 8.8 % (ref 0.0–14.0)
NEUT%: 71.2 % (ref 39.0–75.0)
NEUTROS ABS: 10 10*3/uL — AB (ref 1.5–6.5)
PLATELETS: 178 10*3/uL (ref 140–400)
RBC: 3.61 10*6/uL — ABNORMAL LOW (ref 4.20–5.82)
RDW: 18.4 % — ABNORMAL HIGH (ref 11.0–14.6)
WBC: 14.1 10*3/uL — ABNORMAL HIGH (ref 4.0–10.3)
lymph#: 1 10*3/uL (ref 0.9–3.3)
nRBC: 0 % (ref 0–0)

## 2014-06-20 LAB — COMPREHENSIVE METABOLIC PANEL (CC13)
ALBUMIN: 3.3 g/dL — AB (ref 3.5–5.0)
ALK PHOS: 161 U/L — AB (ref 40–150)
ALT: 11 U/L (ref 0–55)
ANION GAP: 8 meq/L (ref 3–11)
AST: 8 U/L (ref 5–34)
BUN: 4.7 mg/dL — AB (ref 7.0–26.0)
CALCIUM: 8.8 mg/dL (ref 8.4–10.4)
CO2: 30 meq/L — AB (ref 22–29)
Chloride: 100 mEq/L (ref 98–109)
Creatinine: 0.7 mg/dL (ref 0.7–1.3)
Glucose: 106 mg/dl (ref 70–140)
POTASSIUM: 3.8 meq/L (ref 3.5–5.1)
Sodium: 137 mEq/L (ref 136–145)
Total Bilirubin: 0.33 mg/dL (ref 0.20–1.20)
Total Protein: 7.2 g/dL (ref 6.4–8.3)

## 2014-06-20 MED ORDER — IBUPROFEN 600 MG PO TABS: 600.0000 mg | ORAL_TABLET | Freq: Four times a day (QID) | ORAL | Status: DC | PRN

## 2014-06-20 MED ORDER — HEPARIN SOD (PORK) LOCK FLUSH 100 UNIT/ML IV SOLN
500.0000 [IU] | Freq: Once | INTRAVENOUS | Status: AC
Start: 2014-06-20 — End: 2014-06-20
  Administered 2014-06-20: 500 [IU] via INTRAVENOUS
  Filled 2014-06-20: qty 5

## 2014-06-20 MED ORDER — OXYCODONE HCL 10 MG PO TABS: 10.0000 mg | ORAL_TABLET | ORAL | Status: DC | PRN

## 2014-06-20 MED ORDER — LIDOCAINE-PRILOCAINE 2.5-2.5 % EX CREA: 1.0000 "application " | TOPICAL_CREAM | CUTANEOUS | Status: AC | PRN

## 2014-06-20 MED ORDER — SODIUM CHLORIDE 0.9 % IJ SOLN
10.0000 mL | INTRAMUSCULAR | Status: DC | PRN
  Administered 2014-06-20: 10 mL via INTRAVENOUS
  Filled 2014-06-20: qty 10
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
## 2014-06-20 NOTE — Patient Instructions (Signed)
Your recent CT scan showed evidence for disease progression. Your treatment with maintenance Alimta has been discontinued You are being referred to orthopedics to evaluate your left leg for possible intervention to prevent a fracture Follow up in one week to begin immunotherapy

## 2014-06-20 NOTE — Patient Instructions (Signed)

## 2014-06-20 NOTE — Telephone Encounter (Signed)
Gave avs & cal for Jan. Sent mess to sch immuno therapy. See referral notes for referral.

## 2014-06-21 ENCOUNTER — Other Ambulatory Visit (HOSPITAL_COMMUNITY)
Admission: RE | Admit: 2014-06-21 | Discharge: 2014-06-21 | Disposition: A | Discharge status: Home / Self Care | Payer: Medicare Other | Source: Ambulatory Visit | Attending: Internal Medicine | Admitting: Internal Medicine

## 2014-06-21 DIAGNOSIS — C7951 Secondary malignant neoplasm of bone: Secondary | ICD-10-CM | POA: Diagnosis present

## 2014-06-23 ENCOUNTER — Telehealth: Payer: Self-pay | Admitting: *Deleted

## 2014-06-23 ENCOUNTER — Telehealth: Payer: Self-pay | Admitting: Internal Medicine

## 2014-06-23 NOTE — Telephone Encounter (Signed)
LM to inform pt of appt and the times. Appts have gaps between due to no availbility

## 2014-06-23 NOTE — Telephone Encounter (Signed)
Per staff message and POF I have scheduled appts. Advised scheduler of appts and first available given. JMW

## 2014-06-26 ENCOUNTER — Encounter (HOSPITAL_COMMUNITY): Payer: Self-pay

## 2014-06-27 ENCOUNTER — Encounter: Payer: Self-pay | Admitting: Internal Medicine

## 2014-06-27 ENCOUNTER — Ambulatory Visit: Payer: Medicare Other

## 2014-06-27 ENCOUNTER — Ambulatory Visit (HOSPITAL_BASED_OUTPATIENT_CLINIC_OR_DEPARTMENT_OTHER): Payer: Medicare Other | Admitting: Internal Medicine

## 2014-06-27 ENCOUNTER — Telehealth: Payer: Self-pay | Admitting: Internal Medicine

## 2014-06-27 ENCOUNTER — Telehealth: Payer: Self-pay | Admitting: *Deleted

## 2014-06-27 ENCOUNTER — Other Ambulatory Visit: Payer: Self-pay | Admitting: Orthopaedic Surgery

## 2014-06-27 ENCOUNTER — Ambulatory Visit (HOSPITAL_BASED_OUTPATIENT_CLINIC_OR_DEPARTMENT_OTHER): Payer: Medicare Other

## 2014-06-27 VITALS — BP 105/65 | HR 111 | Resp 18 | Ht 69.0 in | Wt 205.3 lb

## 2014-06-27 DIAGNOSIS — C7951 Secondary malignant neoplasm of bone: Secondary | ICD-10-CM

## 2014-06-27 DIAGNOSIS — Z720 Tobacco use: Secondary | ICD-10-CM

## 2014-06-27 DIAGNOSIS — G893 Neoplasm related pain (acute) (chronic): Secondary | ICD-10-CM

## 2014-06-27 DIAGNOSIS — Z95828 Presence of other vascular implants and grafts: Secondary | ICD-10-CM

## 2014-06-27 DIAGNOSIS — C342 Malignant neoplasm of middle lobe, bronchus or lung: Secondary | ICD-10-CM

## 2014-06-27 DIAGNOSIS — C7949 Secondary malignant neoplasm of other parts of nervous system: Secondary | ICD-10-CM

## 2014-06-27 DIAGNOSIS — Z5112 Encounter for antineoplastic immunotherapy: Secondary | ICD-10-CM

## 2014-06-27 LAB — COMPREHENSIVE METABOLIC PANEL (CC13)
ALBUMIN: 3.2 g/dL — AB (ref 3.5–5.0)
ALT: 12 U/L (ref 0–55)
AST: 9 U/L (ref 5–34)
Alkaline Phosphatase: 150 U/L (ref 40–150)
Anion Gap: 10 mEq/L (ref 3–11)
BUN: 6.8 mg/dL — ABNORMAL LOW (ref 7.0–26.0)
CALCIUM: 8.3 mg/dL — AB (ref 8.4–10.4)
CHLORIDE: 103 meq/L (ref 98–109)
CO2: 27 mEq/L (ref 22–29)
Creatinine: 0.7 mg/dL (ref 0.7–1.3)
Glucose: 85 mg/dl (ref 70–140)
POTASSIUM: 3.9 meq/L (ref 3.5–5.1)
SODIUM: 140 meq/L (ref 136–145)
Total Bilirubin: 0.4 mg/dL (ref 0.20–1.20)
Total Protein: 7.1 g/dL (ref 6.4–8.3)

## 2014-06-27 LAB — CBC WITH DIFFERENTIAL/PLATELET
BASO%: 0.8 % (ref 0.0–2.0)
Basophils Absolute: 0.1 10*3/uL (ref 0.0–0.1)
EOS%: 14.5 % — ABNORMAL HIGH (ref 0.0–7.0)
Eosinophils Absolute: 1.6 10*3/uL — ABNORMAL HIGH (ref 0.0–0.5)
HEMATOCRIT: 35.9 % — AB (ref 38.4–49.9)
HEMOGLOBIN: 11.2 g/dL — AB (ref 13.0–17.1)
LYMPH%: 7.6 % — ABNORMAL LOW (ref 14.0–49.0)
MCH: 31.4 pg (ref 27.2–33.4)
MCHC: 31.3 g/dL — ABNORMAL LOW (ref 32.0–36.0)
MCV: 100.3 fL — ABNORMAL HIGH (ref 79.3–98.0)
MONO#: 1.1 10*3/uL — ABNORMAL HIGH (ref 0.1–0.9)
MONO%: 9.7 % (ref 0.0–14.0)
NEUT#: 7.4 10*3/uL — ABNORMAL HIGH (ref 1.5–6.5)
NEUT%: 67.4 % (ref 39.0–75.0)
Platelets: 166 10*3/uL (ref 140–400)
RBC: 3.58 10*6/uL — AB (ref 4.20–5.82)
RDW: 19.7 % — ABNORMAL HIGH (ref 11.0–14.6)
WBC: 11 10*3/uL — ABNORMAL HIGH (ref 4.0–10.3)
lymph#: 0.8 10*3/uL — ABNORMAL LOW (ref 0.9–3.3)

## 2014-06-27 MED ORDER — SODIUM CHLORIDE 0.9 % IV SOLN
3.0000 mg/kg | Freq: Once | INTRAVENOUS | Status: AC
  Administered 2014-06-27: 280 mg via INTRAVENOUS
  Filled 2014-06-27: qty 28

## 2014-06-27 MED ORDER — HEPARIN SOD (PORK) LOCK FLUSH 100 UNIT/ML IV SOLN
500.0000 [IU] | Freq: Once | INTRAVENOUS | Status: AC | PRN
  Administered 2014-06-27: 500 [IU]
  Filled 2014-06-27: qty 5

## 2014-06-27 MED ORDER — SODIUM CHLORIDE 0.9 % IJ SOLN
10.0000 mL | INTRAMUSCULAR | Status: DC | PRN
  Administered 2014-06-27: 10 mL
  Filled 2014-06-27: qty 10

## 2014-06-27 MED ORDER — DENOSUMAB 120 MG/1.7ML ~~LOC~~ SOLN
120.0000 mg | Freq: Once | SUBCUTANEOUS | Status: AC
  Administered 2014-06-27: 120 mg via SUBCUTANEOUS
  Filled 2014-06-27: qty 1.7

## 2014-06-27 MED ORDER — SODIUM CHLORIDE 0.9 % IV SOLN
Freq: Once | INTRAVENOUS | Status: AC
  Administered 2014-06-27: 12:00:00 via INTRAVENOUS

## 2014-06-27 MED ORDER — HYDROMORPHONE HCL 4 MG/ML IJ SOLN
INTRAMUSCULAR | Status: AC
  Filled 2014-06-27: qty 1

## 2014-06-27 MED ORDER — HYDROMORPHONE HCL 4 MG/ML IJ SOLN
2.0000 mg | INTRAMUSCULAR | Status: AC
  Administered 2014-06-27: 2 mg via INTRAVENOUS

## 2014-06-27 MED ORDER — SODIUM CHLORIDE 0.9 % IJ SOLN
10.0000 mL | INTRAMUSCULAR | Status: DC | PRN
  Administered 2014-06-27: 10 mL via INTRAVENOUS
  Filled 2014-06-27: qty 10

## 2014-06-27 NOTE — Progress Notes (Signed)
49 Pt had questions about hospice care. Verbalized understanding that treatment would stop once hospice started. At this time pt chooses to proceed with treatment and is aware he can choose to stop at any time.

## 2014-06-27 NOTE — Telephone Encounter (Signed)
ALSO PT. SAW THE ORTHOPEDIC SURGEON WHO WANTS TO INSERT A ROD INTO PT.'S LEFT LEG ON Tuesday,07/04/14. PT. WOULD LIKE DR.MOHAMED'S THOUGHTS CONCERNING THIS SURGERY.

## 2014-06-27 NOTE — Progress Notes (Signed)
[employer] approved oxycontin 60mg  120 tabs from 06/27/14-06/28/15

## 2014-06-27 NOTE — Patient Instructions (Signed)
Smoking Cessation, Tips for Success  If you are ready to quit smoking, congratulations! You have chosen to help yourself be healthier. Cigarettes bring nicotine, tar, carbon monoxide, and other irritants into your body. Your lungs, heart, and blood vessels will be able to work better without these poisons. There are many different ways to quit smoking. Nicotine gum, nicotine patches, a nicotine inhaler, or nicotine nasal spray can help with physical craving. Hypnosis, support groups, and medicines help break the habit of smoking.  WHAT THINGS CAN I DO TO MAKE QUITTING EASIER?   Here are some tips to help you quit for good:  · Pick a date when you will quit smoking completely. Tell all of your friends and family about your plan to quit on that date.  · Do not try to slowly cut down on the number of cigarettes you are smoking. Pick a quit date and quit smoking completely starting on that day.  · Throw away all cigarettes.    · Clean and remove all ashtrays from your home, work, and car.  · On a card, write down your reasons for quitting. Carry the card with you and read it when you get the urge to smoke.  · Cleanse your body of nicotine. Drink enough water and fluids to keep your urine clear or pale yellow. Do this after quitting to flush the nicotine from your body.  · Learn to predict your moods. Do not let a bad situation be your excuse to have a cigarette. Some situations in your life might tempt you into wanting a cigarette.  · Never have "just one" cigarette. It leads to wanting another and another. Remind yourself of your decision to quit.  · Change habits associated with smoking. If you smoked while driving or when feeling stressed, try other activities to replace smoking. Stand up when drinking your coffee. Brush your teeth after eating. Sit in a different chair when you read the paper. Avoid alcohol while trying to quit, and try to drink fewer caffeinated beverages. Alcohol and caffeine may urge you to  smoke.  · Avoid foods and drinks that can trigger a desire to smoke, such as sugary or spicy foods and alcohol.  · Ask people who smoke not to smoke around you.  · Have something planned to do right after eating or having a cup of coffee. For example, plan to take a walk or exercise.  · Try a relaxation exercise to calm you down and decrease your stress. Remember, you may be tense and nervous for the first 2 weeks after you quit, but this will pass.  · Find new activities to keep your hands busy. Play with a pen, coin, or rubber band. Doodle or draw things on paper.  · Brush your teeth right after eating. This will help cut down on the craving for the taste of tobacco after meals. You can also try mouthwash.    · Use oral substitutes in place of cigarettes. Try using lemon drops, carrots, cinnamon sticks, or chewing gum. Keep them handy so they are available when you have the urge to smoke.  · When you have the urge to smoke, try deep breathing.  · Designate your home as a nonsmoking area.  · If you are a heavy smoker, ask your health care provider about a prescription for nicotine chewing gum. It can ease your withdrawal from nicotine.  · Reward yourself. Set aside the cigarette money you save and buy yourself something nice.  · Look for   support from others. Join a support group or smoking cessation program. Ask someone at home or at work to help you with your plan to quit smoking.  · Always ask yourself, "Do I need this cigarette or is this just a reflex?" Tell yourself, "Today, I choose not to smoke," or "I do not want to smoke." You are reminding yourself of your decision to quit.  · Do not replace cigarette smoking with electronic cigarettes (commonly called e-cigarettes). The safety of e-cigarettes is unknown, and some may contain harmful chemicals.  · If you relapse, do not give up! Plan ahead and think about what you will do the next time you get the urge to smoke.  HOW WILL I FEEL WHEN I QUIT SMOKING?  You  may have symptoms of withdrawal because your body is used to nicotine (the addictive substance in cigarettes). You may crave cigarettes, be irritable, feel very hungry, cough often, get headaches, or have difficulty concentrating. The withdrawal symptoms are only temporary. They are strongest when you first quit but will go away within 10-14 days. When withdrawal symptoms occur, stay in control. Think about your reasons for quitting. Remind yourself that these are signs that your body is healing and getting used to being without cigarettes. Remember that withdrawal symptoms are easier to treat than the major diseases that smoking can cause.   Even after the withdrawal is over, expect periodic urges to smoke. However, these cravings are generally short lived and will go away whether you smoke or not. Do not smoke!  WHAT RESOURCES ARE AVAILABLE TO HELP ME QUIT SMOKING?  Your health care provider can direct you to community resources or hospitals for support, which may include:  · Group support.  · Education.  · Hypnosis.  · Therapy.  Document Released: 02/22/2004 Document Revised: 10/10/2013 Document Reviewed: 11/11/2012  [employer]® Patient Information ©2015 [employer]. This information is not intended to replace advice given to you by your health care provider. Make sure you discuss any questions you have with your health care provider.

## 2014-06-27 NOTE — Patient Instructions (Addendum)
Nivolumab injection What is this medicine? NIVOLUMAB (nye VOL ue mab) is used to treat certain types of melanoma and lung cancer. This medicine may be used for other purposes; ask your health care provider or pharmacist if you have questions. COMMON BRAND NAME(S): Opdivo What should I tell my health care provider before I take this medicine? They need to know if you have any of these conditions: -eye disease, vision problems -history of pancreatitis -immune system problems -inflammatory bowel disease -kidney disease -liver disease -lung disease -lupus -myasthenia gravis -multiple sclerosis -organ transplant -stomach or intestine problems -thyroid disease -tingling of the fingers or toes, or other nerve disorder -an unusual or allergic reaction to nivolumab, other medicines, foods, dyes, or preservatives -pregnant or trying to get pregnant -breast-feeding How should I use this medicine? This medicine is for infusion into a vein. It is given by a health care professional in a hospital or clinic setting. A special MedGuide will be given to you before each treatment. Be sure to read this information carefully each time. Talk to your pediatrician regarding the use of this medicine in children. Special care may be needed. Overdosage: If you think you've taken too much of this medicine contact a poison control center or emergency room at once. Overdosage: If you think you have taken too much of this medicine contact a poison control center or emergency room at once. NOTE: This medicine is only for you. Do not share this medicine with others. What if I miss a dose? It is important not to miss your dose. Call your doctor or health care professional if you are unable to keep an appointment. What may interact with this medicine? Interactions have not been studied. This list may not describe all possible interactions. Give your health care provider a list of all the medicines, herbs,  non-prescription drugs, or dietary supplements you use. Also tell them if you smoke, drink alcohol, or use illegal drugs. Some items may interact with your medicine. What should I watch for while using this medicine? Tell your doctor or healthcare professional if your symptoms do not start to get better or if they get worse. Your condition will be monitored carefully while you are receiving this medicine. You may need blood work done while you are taking this medicine. What side effects may I notice from receiving this medicine? Side effects that you should report to your doctor or health care professional as soon as possible: -allergic reactions like skin rash, itching or hives, swelling of the face, lips, or tongue -black, tarry stools -bloody or watery diarrhea -changes in vision -chills -cough -depressed mood -eye pain -feeling anxious -fever -general ill feeling or flu-like symptoms -hair loss -loss of appetite -low blood counts - this medicine may decrease the number of white blood cells, red blood cells and platelets. You may be at increased risk for infections and bleeding -pain, tingling, numbness in the hands or feet -redness, blistering, peeling or loosening of the skin, including inside the mouth -red pinpoint spots on skin -signs of decreased platelets or bleeding - bruising, pinpoint red spots on the skin, black, tarry stools, blood in the urine -signs of decreased red blood cells - unusually weak or tired, feeling faint or lightheaded, falls -signs of infection - fever or chills, cough, sore throat, pain or trouble passing urine -signs and symptoms of a dangerous change in heartbeat or heart rhythm like chest pain; dizziness; fast or irregular heartbeat; palpitations; feeling faint or lightheaded, falls; breathing problems -signs  and symptoms of high blood sugar such as dizziness; dry mouth; dry skin; fruity breath; nausea; stomach pain; increased hunger or thirst; increased  urination -signs and symptoms of kidney injury like trouble passing urine or change in the amount of urine -signs and symptoms of liver injury like dark yellow or brown urine; general ill feeling or flu-like symptoms; light-colored stools; loss of appetite; nausea; right upper belly pain; unusually weak or tired; yellowing of the eyes or skin -signs and symptoms of increased potassium like muscle weakness; chest pain; or fast, irregular heartbeat -signs and symptoms of low potassium like muscle cramps or muscle pain; chest pain; dizziness; feeling faint or lightheaded, falls; palpitations; breathing problems; or fast, irregular heartbeat -swelling of the ankles, feet, hands -weight gainSide effects that usually do not require medical attention (report to your doctor or health care professional if they continue or are bothersome): -constipation -general ill feeling or flu-like symptoms -hair loss -loss of appetite -nausea, vomiting This list may not describe all possible side effects. Call your doctor for medical advice about side effects. You may report side effects to FDA at 1-800-FDA-1088. Where should I keep my medicine? This drug is given in a hospital or clinic and will not be stored at home. NOTE: This sheet is a summary. It may not cover all possible information. If you have questions about this medicine, talk to your doctor, pharmacist, or health care provider.  2015, [employer]. (2013-08-15 13:18:19) [facility] Discharge Instructions for Patients Receiving Chemotherapy  Today you received the following chemotherapy agents Nivolumab and Xgeva  To help prevent nausea and vomiting after your treatment, we encourage you to take your nausea medication as prescribed.   If you develop nausea and vomiting that is not controlled by your nausea medication, call the clinic.   BELOW ARE SYMPTOMS THAT SHOULD BE REPORTED IMMEDIATELY:  *FEVER GREATER THAN 100.5  F  *CHILLS WITH OR WITHOUT FEVER  NAUSEA AND VOMITING THAT IS NOT CONTROLLED WITH YOUR NAUSEA MEDICATION  *UNUSUAL SHORTNESS OF BREATH  *UNUSUAL BRUISING OR BLEEDING  TENDERNESS IN MOUTH AND THROAT WITH OR WITHOUT PRESENCE OF ULCERS  *URINARY PROBLEMS  *BOWEL PROBLEMS  UNUSUAL RASH Items with * indicate a potential emergency and should be followed up as soon as possible.  Feel free to call the clinic you have any questions or concerns. The clinic phone number is [phone].

## 2014-06-27 NOTE — Telephone Encounter (Signed)
Gave avs & calendar for February. Sent mess to [facility] if ok to see in treatment.
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
## 2014-06-27 NOTE — Patient Instructions (Signed)

## 2014-06-28 ENCOUNTER — Telehealth: Payer: Self-pay

## 2014-06-28 ENCOUNTER — Other Ambulatory Visit: Payer: Self-pay | Admitting: *Deleted

## 2014-06-28 DIAGNOSIS — C3490 Malignant neoplasm of unspecified part of unspecified bronchus or lung: Secondary | ICD-10-CM

## 2014-06-28 DIAGNOSIS — C7949 Secondary malignant neoplasm of other parts of nervous system: Secondary | ICD-10-CM

## 2014-06-28 DIAGNOSIS — Z23 Encounter for immunization: Secondary | ICD-10-CM

## 2014-06-28 MED ORDER — OXYCODONE HCL 10 MG PO TABS: 10.0000 mg | ORAL_TABLET | ORAL | Status: DC | PRN

## 2014-06-28 NOTE — Telephone Encounter (Signed)
-----   Message from Rosalie Gums, RN sent at 06/27/2014 12:27 PM EST ----- Regarding: New Treatment Call Pt of Dr.Mohamed started Nivolomab on 06/27/14.

## 2014-06-28 NOTE — Telephone Encounter (Signed)
Left a message for Mr. Gopal to call [phone] if he is experiencing any side effects of concern from his treatment.

## 2014-06-28 NOTE — Telephone Encounter (Signed)
Per Dr Vista Mink, ok for pt to have surgery on his left leg.  Called and left a message with this information

## 2014-06-28 NOTE — Progress Notes (Signed)
This encounter was created in error - please disregard.
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
## 2014-07-01 NOTE — Pre-Procedure Instructions (Signed)
[name] [name]  07/01/2014   Your procedure is scheduled on:  January 26  Report to [facility] Admitting at 11:30 AM.  Call this number if you have problems the morning of surgery: [phone]   Remember:   Do not eat food or drink liquids after midnight.   Take these medicines the morning of surgery with A SIP OF WATER: Tylenol (if needed), Dexilant, Oxycodone (both scheduled dose and the as needed dose if it is needed), Lyrica, Compazine (if needed), Seroquel,   STOP/ Do not take Aspirin, Aleve, Naproxen, Advil, Ibuprofen, Motrin, Vitamins, Herbs, or Supplements starting today   Do not wear jewelry, make-up or nail polish.  Do not wear lotions, powders, or perfumes. You may wear deodorant.  Do not shave 48 hours prior to surgery. Men may shave face and neck.  Do not bring valuables to the hospital.  [facility] is not responsible for any belongings or valuables.               Contacts, dentures or bridgework may not be worn into surgery.  Leave suitcase in the car. After surgery it may be brought to your room.  For patients admitted to the hospital, discharge time is determined by your treatment team.               Special Instructions: [facility] - Preparing for Surgery  Before surgery, you can play an important role.  Because skin is not sterile, your skin needs to be as free of germs as possible.  You can reduce the number of germs on you skin by washing with CHG (chlorahexidine gluconate) soap before surgery.  CHG is an antiseptic cleaner which kills germs and bonds with the skin to continue killing germs even after washing.  Please DO NOT use if you have an allergy to CHG or antibacterial soaps.  If your skin becomes reddened/irritated stop using the CHG and inform your nurse when you arrive at Short Stay.  Do not shave (including legs and underarms) for at least 48 hours prior to the first CHG shower.  You may shave your face.  Please follow these instructions  carefully:   1.  Shower with CHG Soap the night before surgery and the morning of Surgery.  2.  If you choose to wash your hair, wash your hair first as usual with your normal shampoo.  3.  After you shampoo, rinse your hair and body thoroughly to remove the shampoo.  4.  Use CHG as you would any other liquid soap.  You can apply CHG directly to the skin and wash gently with scrungie or a clean washcloth.  5.  Apply the CHG Soap to your body ONLY FROM THE NECK DOWN.  Do not use on open wounds or open sores.  Avoid contact with your eyes, ears, mouth and genitals (private parts).  Wash genitals (private parts) with your normal soap.  6.  Wash thoroughly, paying special attention to the area where your surgery will be performed.  7.  Thoroughly rinse your body with warm water from the neck down.  8.  DO NOT shower/wash with your normal soap after using and rinsing off the CHG Soap.  9.  Pat yourself dry with a clean towel.            10.  Wear clean pajamas.            11.  Place clean sheets on your bed the night of your  first shower and do not sleep with pets.  Day of Surgery  Do not apply any lotions the morning of surgery.  Please wear clean clothes to the hospital/surgery center.     Please read over the following fact sheets that you were given: Pain Booklet, Coughing and Deep Breathing, Blood Transfusion Information and Surgical Site Infection Prevention

## 2014-07-03 ENCOUNTER — Encounter (HOSPITAL_COMMUNITY)
Admission: RE | Admit: 2014-07-03 | Discharge: 2014-07-03 | Disposition: A | Discharge status: Home / Self Care | Payer: Medicare Other | Source: Ambulatory Visit | Attending: Orthopaedic Surgery | Admitting: Orthopaedic Surgery

## 2014-07-03 ENCOUNTER — Encounter (HOSPITAL_COMMUNITY): Payer: Self-pay

## 2014-07-03 HISTORY — DX: Cough, unspecified: R05.9

## 2014-07-03 HISTORY — DX: Cough: R05

## 2014-07-03 LAB — URINE MICROSCOPIC-ADD ON

## 2014-07-03 LAB — CBC WITH DIFFERENTIAL/PLATELET
Basophils Absolute: 0 10*3/uL (ref 0.0–0.1)
Basophils Relative: 0 % (ref 0–1)
EOS PCT: 12 % — AB (ref 0–5)
Eosinophils Absolute: 1.5 10*3/uL — ABNORMAL HIGH (ref 0.0–0.7)
HEMATOCRIT: 39.1 % (ref 39.0–52.0)
Hemoglobin: 12.2 g/dL — ABNORMAL LOW (ref 13.0–17.0)
Lymphocytes Relative: 7 % — ABNORMAL LOW (ref 12–46)
Lymphs Abs: 0.9 10*3/uL (ref 0.7–4.0)
MCH: 32.4 pg (ref 26.0–34.0)
MCHC: 31.2 g/dL (ref 30.0–36.0)
MCV: 103.7 fL — AB (ref 78.0–100.0)
MONO ABS: 0.9 10*3/uL (ref 0.1–1.0)
Monocytes Relative: 7 % (ref 3–12)
Neutro Abs: 9.5 10*3/uL — ABNORMAL HIGH (ref 1.7–7.7)
Neutrophils Relative %: 74 % (ref 43–77)
PLATELETS: 193 10*3/uL (ref 150–400)
RBC: 3.77 MIL/uL — AB (ref 4.22–5.81)
RDW: 18.1 % — AB (ref 11.5–15.5)
WBC: 12.7 10*3/uL — AB (ref 4.0–10.5)

## 2014-07-03 LAB — BASIC METABOLIC PANEL
Anion gap: 9 (ref 5–15)
BUN: 7 mg/dL (ref 6–23)
CO2: 26 mmol/L (ref 19–32)
CREATININE: 0.62 mg/dL (ref 0.50–1.35)
Calcium: 8.3 mg/dL — ABNORMAL LOW (ref 8.4–10.5)
Chloride: 104 mmol/L (ref 96–112)
GFR calc non Af Amer: >90 mL/min (ref >90)
Glucose, Bld: 94 mg/dL (ref 70–99)
Potassium: 4.5 mmol/L (ref 3.5–5.1)
Sodium: 139 mmol/L (ref 135–145)

## 2014-07-03 LAB — TYPE AND SCREEN
ABO/RH(D): O POS
ANTIBODY SCREEN: NEGATIVE

## 2014-07-03 LAB — ABO/RH: ABO/RH(D): O POS

## 2014-07-03 LAB — URINALYSIS, ROUTINE W REFLEX MICROSCOPIC
GLUCOSE, UA: NEGATIVE mg/dL
Hgb urine dipstick: NEGATIVE
KETONES UR: NEGATIVE mg/dL
Nitrite: NEGATIVE
Protein, ur: NEGATIVE mg/dL
Specific Gravity, Urine: 1.021 (ref 1.005–1.030)
UROBILINOGEN UA: 1 mg/dL (ref 0.0–1.0)
pH: 5.5 (ref 5.0–8.0)

## 2014-07-03 LAB — PROTIME-INR
INR: 0.99 (ref 0.00–1.49)
PROTHROMBIN TIME: 13.2 s (ref 11.6–15.2)

## 2014-07-03 LAB — APTT: APTT: 34 s (ref 24–37)

## 2014-07-03 MED ORDER — CEFAZOLIN SODIUM-DEXTROSE 2-3 GM-% IV SOLR
2.0000 g | INTRAVENOUS | Status: AC
  Administered 2014-07-04: 2 g via INTRAVENOUS
  Filled 2014-07-03: qty 50

## 2014-07-03 MED ORDER — LACTATED RINGERS IV SOLN
INTRAVENOUS | Status: DC
  Administered 2014-07-04: 50 mL/h via INTRAVENOUS

## 2014-07-03 MED ORDER — CHLORHEXIDINE GLUCONATE 4 % EX LIQD
60.0000 mL | Freq: Once | CUTANEOUS | Status: DC
  Filled 2014-07-03: qty 60

## 2014-07-04 ENCOUNTER — Telehealth: Payer: Self-pay | Admitting: *Deleted

## 2014-07-04 ENCOUNTER — Encounter (HOSPITAL_COMMUNITY)
Admission: RE | Disposition: A | Discharge status: Skilled Nursing Facility | Payer: Self-pay | Source: Ambulatory Visit | Attending: Internal Medicine

## 2014-07-04 ENCOUNTER — Inpatient Hospital Stay (HOSPITAL_COMMUNITY)
Admission: RE | Admit: 2014-07-04 | Discharge: 2014-07-12 | DRG: 480 | Disposition: A | Discharge status: Skilled Nursing Facility | Payer: Medicare Other | Source: Ambulatory Visit | Attending: Internal Medicine | Admitting: Internal Medicine

## 2014-07-04 ENCOUNTER — Other Ambulatory Visit: Payer: Self-pay | Admitting: Medical Oncology

## 2014-07-04 ENCOUNTER — Inpatient Hospital Stay (HOSPITAL_COMMUNITY): Payer: Medicare Other

## 2014-07-04 ENCOUNTER — Inpatient Hospital Stay (HOSPITAL_COMMUNITY): Payer: Medicare Other | Admitting: Anesthesiology

## 2014-07-04 ENCOUNTER — Encounter (HOSPITAL_COMMUNITY): Payer: Self-pay | Admitting: *Deleted

## 2014-07-04 DIAGNOSIS — G8929 Other chronic pain: Secondary | ICD-10-CM | POA: Diagnosis present

## 2014-07-04 DIAGNOSIS — M25559 Pain in unspecified hip: Secondary | ICD-10-CM | POA: Diagnosis present

## 2014-07-04 DIAGNOSIS — K219 Gastro-esophageal reflux disease without esophagitis: Secondary | ICD-10-CM | POA: Diagnosis present

## 2014-07-04 DIAGNOSIS — C342 Malignant neoplasm of middle lobe, bronchus or lung: Secondary | ICD-10-CM | POA: Diagnosis present

## 2014-07-04 DIAGNOSIS — C7951 Secondary malignant neoplasm of bone: Secondary | ICD-10-CM | POA: Diagnosis present

## 2014-07-04 DIAGNOSIS — C7949 Secondary malignant neoplasm of other parts of nervous system: Secondary | ICD-10-CM

## 2014-07-04 DIAGNOSIS — J189 Pneumonia, unspecified organism: Secondary | ICD-10-CM

## 2014-07-04 DIAGNOSIS — F17210 Nicotine dependence, cigarettes, uncomplicated: Secondary | ICD-10-CM | POA: Diagnosis present

## 2014-07-04 DIAGNOSIS — M549 Dorsalgia, unspecified: Secondary | ICD-10-CM | POA: Diagnosis present

## 2014-07-04 DIAGNOSIS — F17200 Nicotine dependence, unspecified, uncomplicated: Secondary | ICD-10-CM | POA: Diagnosis present

## 2014-07-04 DIAGNOSIS — Z23 Encounter for immunization: Secondary | ICD-10-CM

## 2014-07-04 DIAGNOSIS — M25552 Pain in left hip: Secondary | ICD-10-CM | POA: Diagnosis present

## 2014-07-04 DIAGNOSIS — Z419 Encounter for procedure for purposes other than remedying health state, unspecified: Secondary | ICD-10-CM

## 2014-07-04 DIAGNOSIS — E785 Hyperlipidemia, unspecified: Secondary | ICD-10-CM | POA: Diagnosis present

## 2014-07-04 DIAGNOSIS — F319 Bipolar disorder, unspecified: Secondary | ICD-10-CM | POA: Diagnosis present

## 2014-07-04 DIAGNOSIS — J690 Pneumonitis due to inhalation of food and vomit: Secondary | ICD-10-CM | POA: Diagnosis not present

## 2014-07-04 DIAGNOSIS — C3490 Malignant neoplasm of unspecified part of unspecified bronchus or lung: Secondary | ICD-10-CM | POA: Diagnosis present

## 2014-07-04 DIAGNOSIS — R509 Fever, unspecified: Secondary | ICD-10-CM

## 2014-07-04 DIAGNOSIS — R0602 Shortness of breath: Secondary | ICD-10-CM

## 2014-07-04 DIAGNOSIS — A419 Sepsis, unspecified organism: Secondary | ICD-10-CM | POA: Diagnosis present

## 2014-07-04 HISTORY — PX: INTRAMEDULLARY (IM) NAIL INTERTROCHANTERIC: SHX5875

## 2014-07-04 SURGERY — FIXATION, FRACTURE, INTERTROCHANTERIC, WITH INTRAMEDULLARY ROD
Anesthesia: General | Site: Leg Upper | Laterality: Left

## 2014-07-04 MED ORDER — BUPIVACAINE-EPINEPHRINE (PF) 0.5% -1:200000 IJ SOLN
INTRAMUSCULAR | Status: AC
  Filled 2014-07-04: qty 30

## 2014-07-04 MED ORDER — LIDOCAINE HCL (CARDIAC) 20 MG/ML IV SOLN
INTRAVENOUS | Status: DC | PRN
  Administered 2014-07-04: 100 mg via INTRAVENOUS

## 2014-07-04 MED ORDER — SENNA-DOCUSATE SODIUM 8.6-50 MG PO TABS
4.0000 | ORAL_TABLET | Freq: Two times a day (BID) | ORAL | Status: DC | PRN
  Filled 2014-07-04: qty 4

## 2014-07-04 MED ORDER — OXYCODONE HCL ER 15 MG PO T12A
120.0000 mg | EXTENDED_RELEASE_TABLET | Freq: Two times a day (BID) | ORAL | Status: DC
  Administered 2014-07-04 – 2014-07-05 (×3): 120 mg via ORAL
  Filled 2014-07-04 (×7): qty 8

## 2014-07-04 MED ORDER — HYDROMORPHONE HCL 1 MG/ML IJ SOLN
0.5000 mg | INTRAMUSCULAR | Status: DC | PRN
  Administered 2014-07-04 – 2014-07-05 (×4): 1 mg via INTRAVENOUS
  Filled 2014-07-04 (×4): qty 1

## 2014-07-04 MED ORDER — HYDROMORPHONE HCL 1 MG/ML IJ SOLN
INTRAMUSCULAR | Status: AC
  Filled 2014-07-04: qty 1

## 2014-07-04 MED ORDER — ACETAMINOPHEN 325 MG PO TABS
650.0000 mg | ORAL_TABLET | Freq: Four times a day (QID) | ORAL | Status: DC | PRN
Start: 2014-07-04 — End: 2014-07-12
  Administered 2014-07-05 – 2014-07-06 (×2): 650 mg via ORAL
  Filled 2014-07-04 (×2): qty 2

## 2014-07-04 MED ORDER — OXYCODONE HCL 5 MG PO TABS
10.0000 mg | ORAL_TABLET | ORAL | Status: DC | PRN
  Administered 2014-07-04 – 2014-07-07 (×13): 10 mg via ORAL
  Filled 2014-07-04 (×15): qty 2

## 2014-07-04 MED ORDER — FENTANYL CITRATE 0.05 MG/ML IJ SOLN
INTRAMUSCULAR | Status: AC
  Filled 2014-07-04: qty 5

## 2014-07-04 MED ORDER — FENTANYL CITRATE 0.05 MG/ML IJ SOLN
INTRAMUSCULAR | Status: DC | PRN
  Administered 2014-07-04: 100 ug via INTRAVENOUS
  Administered 2014-07-04: 50 ug via INTRAVENOUS
  Administered 2014-07-04: 100 ug via INTRAVENOUS
  Administered 2014-07-04: 50 ug via INTRAVENOUS
  Administered 2014-07-04: 150 ug via INTRAVENOUS
  Administered 2014-07-04 (×3): 100 ug via INTRAVENOUS

## 2014-07-04 MED ORDER — QUETIAPINE FUMARATE 400 MG PO TABS
400.0000 mg | ORAL_TABLET | Freq: Every day | ORAL | Status: DC
  Administered 2014-07-04 – 2014-07-11 (×8): 400 mg via ORAL
  Filled 2014-07-04 (×10): qty 1

## 2014-07-04 MED ORDER — LIDOCAINE-PRILOCAINE 2.5-2.5 % EX CREA
1.0000 "application " | TOPICAL_CREAM | CUTANEOUS | Status: DC | PRN
  Filled 2014-07-04: qty 5

## 2014-07-04 MED ORDER — PROCHLORPERAZINE MALEATE 10 MG PO TABS
10.0000 mg | ORAL_TABLET | Freq: Four times a day (QID) | ORAL | Status: DC | PRN
  Filled 2014-07-04: qty 1

## 2014-07-04 MED ORDER — METHOCARBAMOL 500 MG PO TABS
ORAL_TABLET | ORAL | Status: AC
  Filled 2014-07-04: qty 1

## 2014-07-04 MED ORDER — ONDANSETRON HCL 4 MG/2ML IJ SOLN: 4.0000 mg | Freq: Four times a day (QID) | INTRAMUSCULAR | Status: DC | PRN

## 2014-07-04 MED ORDER — LACTATED RINGERS IV SOLN
INTRAVENOUS | Status: DC
  Administered 2014-07-05: 05:00:00 via INTRAVENOUS

## 2014-07-04 MED ORDER — PANTOPRAZOLE SODIUM 40 MG PO TBEC
40.0000 mg | DELAYED_RELEASE_TABLET | Freq: Every day | ORAL | Status: DC
  Administered 2014-07-04 – 2014-07-12 (×9): 40 mg via ORAL
  Filled 2014-07-04 (×9): qty 1

## 2014-07-04 MED ORDER — BUPIVACAINE-EPINEPHRINE (PF) 0.5% -1:200000 IJ SOLN
INTRAMUSCULAR | Status: DC | PRN
  Administered 2014-07-04: 30 mL

## 2014-07-04 MED ORDER — OXYCODONE HCL 5 MG/5ML PO SOLN
ORAL | Status: AC
  Administered 2014-07-04: 10 mg
  Filled 2014-07-04: qty 10

## 2014-07-04 MED ORDER — MIDAZOLAM HCL 5 MG/5ML IJ SOLN
INTRAMUSCULAR | Status: DC | PRN
  Administered 2014-07-04: 2 mg via INTRAVENOUS

## 2014-07-04 MED ORDER — MEPERIDINE HCL 25 MG/ML IJ SOLN: 6.2500 mg | INTRAMUSCULAR | Status: DC | PRN

## 2014-07-04 MED ORDER — 0.9 % SODIUM CHLORIDE (POUR BTL) OPTIME
TOPICAL | Status: DC | PRN
  Administered 2014-07-04: 1000 mL

## 2014-07-04 MED ORDER — ONDANSETRON HCL 4 MG PO TABS: 4.0000 mg | ORAL_TABLET | Freq: Four times a day (QID) | ORAL | Status: DC | PRN

## 2014-07-04 MED ORDER — FENTANYL CITRATE 0.05 MG/ML IJ SOLN
100.0000 ug | Freq: Once | INTRAMUSCULAR | Status: AC
  Administered 2014-07-04 (×2): 50 ug via INTRAVENOUS

## 2014-07-04 MED ORDER — FENTANYL CITRATE 0.05 MG/ML IJ SOLN
INTRAMUSCULAR | Status: AC
  Administered 2014-07-04: 50 ug via INTRAVENOUS
  Filled 2014-07-04: qty 2

## 2014-07-04 MED ORDER — PROPOFOL 10 MG/ML IV BOLUS
INTRAVENOUS | Status: DC | PRN
  Administered 2014-07-04: 50 mg via INTRAVENOUS
  Administered 2014-07-04: 200 mg via INTRAVENOUS

## 2014-07-04 MED ORDER — CALCIUM CARBONATE 1250 (500 CA) MG PO TABS
1.0000 | ORAL_TABLET | Freq: Two times a day (BID) | ORAL | Status: DC
  Administered 2014-07-05 – 2014-07-12 (×15): 500 mg via ORAL
  Filled 2014-07-04 (×22): qty 1

## 2014-07-04 MED ORDER — LABETALOL HCL 5 MG/ML IV SOLN
INTRAVENOUS | Status: DC | PRN
  Administered 2014-07-04: 2.5 mg via INTRAVENOUS
  Administered 2014-07-04: 5 mg via INTRAVENOUS

## 2014-07-04 MED ORDER — PREGABALIN 50 MG PO CAPS
50.0000 mg | ORAL_CAPSULE | Freq: Three times a day (TID) | ORAL | Status: DC
  Administered 2014-07-04 – 2014-07-12 (×24): 50 mg via ORAL
  Filled 2014-07-04 (×25): qty 1

## 2014-07-04 MED ORDER — HYDROMORPHONE HCL 1 MG/ML IJ SOLN
INTRAMUSCULAR | Status: AC
  Administered 2014-07-04: 0.5 mg via INTRAVENOUS
  Filled 2014-07-04: qty 2

## 2014-07-04 MED ORDER — CALCIUM CARBONATE 600 MG PO TABS
600.0000 mg | ORAL_TABLET | Freq: Two times a day (BID) | ORAL | Status: DC
Start: 2014-07-04 — End: 2014-07-04

## 2014-07-04 MED ORDER — QUETIAPINE FUMARATE 25 MG PO TABS
25.0000 mg | ORAL_TABLET | Freq: Three times a day (TID) | ORAL | Status: DC
  Administered 2014-07-04 – 2014-07-12 (×24): 25 mg via ORAL
  Filled 2014-07-04 (×30): qty 1

## 2014-07-04 MED ORDER — HYDROMORPHONE HCL 1 MG/ML IJ SOLN
INTRAMUSCULAR | Status: AC
  Administered 2014-07-04: 0.5 mg via INTRAVENOUS
  Filled 2014-07-04: qty 1

## 2014-07-04 MED ORDER — HYDROMORPHONE HCL 1 MG/ML IJ SOLN
INTRAMUSCULAR | Status: AC
Start: 2014-07-04 — End: 2014-07-04
  Administered 2014-07-04: 0.5 mg via INTRAVENOUS
  Filled 2014-07-04: qty 1

## 2014-07-04 MED ORDER — DIAZEPAM 5 MG PO TABS
ORAL_TABLET | ORAL | Status: AC
  Filled 2014-07-04: qty 1

## 2014-07-04 MED ORDER — IBUPROFEN 200 MG PO TABS
600.0000 mg | ORAL_TABLET | Freq: Four times a day (QID) | ORAL | Status: DC | PRN
  Administered 2014-07-05: 600 mg via ORAL
  Filled 2014-07-04: qty 3

## 2014-07-04 MED ORDER — LACTATED RINGERS IV SOLN
INTRAVENOUS | Status: DC | PRN
  Administered 2014-07-04 (×2): via INTRAVENOUS

## 2014-07-04 MED ORDER — HYDROMORPHONE HCL 1 MG/ML IJ SOLN
0.2500 mg | INTRAMUSCULAR | Status: DC | PRN
  Administered 2014-07-04 (×6): 0.5 mg via INTRAVENOUS
  Administered 2014-07-04: 1 mg via INTRAVENOUS

## 2014-07-04 MED ORDER — METOCLOPRAMIDE HCL 10 MG PO TABS
5.0000 mg | ORAL_TABLET | Freq: Three times a day (TID) | ORAL | Status: DC | PRN
  Filled 2014-07-04: qty 1

## 2014-07-04 MED ORDER — METHOCARBAMOL 500 MG PO TABS
500.0000 mg | ORAL_TABLET | Freq: Four times a day (QID) | ORAL | Status: DC | PRN
  Administered 2014-07-04 – 2014-07-12 (×18): 500 mg via ORAL
  Filled 2014-07-04 (×18): qty 1

## 2014-07-04 MED ORDER — DIPHENHYDRAMINE HCL 12.5 MG/5ML PO ELIX
12.5000 mg | ORAL_SOLUTION | ORAL | Status: DC | PRN
  Filled 2014-07-04: qty 10

## 2014-07-04 MED ORDER — METOCLOPRAMIDE HCL 5 MG/ML IJ SOLN
5.0000 mg | Freq: Three times a day (TID) | INTRAMUSCULAR | Status: DC | PRN
  Filled 2014-07-04: qty 2

## 2014-07-04 MED ORDER — ONDANSETRON HCL 4 MG/2ML IJ SOLN: 4.0000 mg | Freq: Once | INTRAMUSCULAR | Status: DC | PRN

## 2014-07-04 MED ORDER — FOLIC ACID 1 MG PO TABS
2.5000 mg | ORAL_TABLET | Freq: Two times a day (BID) | ORAL | Status: DC
  Administered 2014-07-04 – 2014-07-12 (×14): 2.5 mg via ORAL
  Filled 2014-07-04 (×18): qty 1

## 2014-07-04 MED ORDER — ARTIFICIAL TEARS OP OINT
TOPICAL_OINTMENT | OPHTHALMIC | Status: DC | PRN
  Administered 2014-07-04: 1 via OPHTHALMIC

## 2014-07-04 MED ORDER — ASPIRIN 81 MG PO CHEW
325.0000 mg | CHEWABLE_TABLET | Freq: Two times a day (BID) | ORAL | Status: DC
  Administered 2014-07-04 – 2014-07-05 (×3): 325 mg via ORAL
  Filled 2014-07-04: qty 4
  Filled 2014-07-04: qty 5
  Filled 2014-07-04 (×2): qty 4
  Filled 2014-07-04: qty 5
  Filled 2014-07-04: qty 4
  Filled 2014-07-04: qty 5

## 2014-07-04 MED ORDER — CEFAZOLIN SODIUM-DEXTROSE 2-3 GM-% IV SOLR
2.0000 g | Freq: Four times a day (QID) | INTRAVENOUS | Status: AC
  Administered 2014-07-04 – 2014-07-05 (×2): 2 g via INTRAVENOUS
  Filled 2014-07-04 (×2): qty 50

## 2014-07-04 MED ORDER — DIAZEPAM 5 MG PO TABS
5.0000 mg | ORAL_TABLET | Freq: Every day | ORAL | Status: DC
  Administered 2014-07-04 – 2014-07-11 (×9): 5 mg via ORAL
  Filled 2014-07-04 (×8): qty 1

## 2014-07-04 MED ORDER — METHOCARBAMOL 1000 MG/10ML IJ SOLN
500.0000 mg | Freq: Four times a day (QID) | INTRAVENOUS | Status: DC | PRN
  Filled 2014-07-04: qty 5

## 2014-07-04 MED ORDER — ROCURONIUM BROMIDE 50 MG/5ML IV SOLN
INTRAVENOUS | Status: AC
  Filled 2014-07-04: qty 1

## 2014-07-04 MED ORDER — DOCUSATE SODIUM 100 MG PO CAPS
100.0000 mg | ORAL_CAPSULE | Freq: Two times a day (BID) | ORAL | Status: DC
  Administered 2014-07-04 – 2014-07-12 (×16): 100 mg via ORAL
  Filled 2014-07-04 (×21): qty 1

## 2014-07-04 MED ORDER — ARTIFICIAL TEARS OP OINT
TOPICAL_OINTMENT | OPHTHALMIC | Status: AC
  Filled 2014-07-04: qty 3.5

## 2014-07-04 SURGICAL SUPPLY — 59 items
BANDAGE ELASTIC 4 VELCRO ST LF (GAUZE/BANDAGES/DRESSINGS) ×4 IMPLANT
BANDAGE ELASTIC 6 VELCRO ST LF (GAUZE/BANDAGES/DRESSINGS) ×4 IMPLANT
BLADE SURG 10 STRL SS (BLADE) ×4 IMPLANT
BNDG GAUZE ELAST 4 BULKY (GAUZE/BANDAGES/DRESSINGS) ×4 IMPLANT
CLEANER TIP ELECTROSURG 2X2 (MISCELLANEOUS) ×4 IMPLANT
COVER MAYO STAND STRL (DRAPES) ×4 IMPLANT
COVER SURGICAL LIGHT HANDLE (MISCELLANEOUS) ×4 IMPLANT
CUFF TOURNIQUET SINGLE 34IN LL (TOURNIQUET CUFF) IMPLANT
CUFF TOURNIQUET SINGLE 44IN (TOURNIQUET CUFF) IMPLANT
DRAPE C-ARM 42X72 X-RAY (DRAPES) ×4 IMPLANT
DRAPE C-ARMOR (DRAPES) ×3 IMPLANT
DRAPE IMP U-DRAPE 54X76 (DRAPES) ×1 IMPLANT
DRAPE PROXIMA HALF (DRAPES) ×2 IMPLANT
DRAPE STERI IOBAN 125X83 (DRAPES) ×3 IMPLANT
DRAPE U-SHAPE 47X51 STRL (DRAPES) ×1 IMPLANT
DRESSING AQUACEL AQ EXTRA 4X5 (GAUZE/BANDAGES/DRESSINGS) IMPLANT
DRSG EMULSION OIL 3X3 NADH (GAUZE/BANDAGES/DRESSINGS) ×4 IMPLANT
DRSG MEPILEX BORDER 4X4 (GAUZE/BANDAGES/DRESSINGS) ×6 IMPLANT
DURAPREP 26ML APPLICATOR (WOUND CARE) ×4 IMPLANT
ELECT REM PT RETURN 9FT ADLT (ELECTROSURGICAL) ×4
ELECTRODE REM PT RTRN 9FT ADLT (ELECTROSURGICAL) ×2 IMPLANT
FACESHIELD STD STERILE (MASK) ×9 IMPLANT
GAUZE SPONGE 4X4 12PLY STRL (GAUZE/BANDAGES/DRESSINGS) ×4 IMPLANT
GLOVE BIO SURGEON STRL SZ8 (GLOVE) ×16 IMPLANT
GLOVE BIOGEL PI IND STRL 8 (GLOVE) ×2 IMPLANT
GLOVE BIOGEL PI INDICATOR 8 (GLOVE) ×2
GOWN STRL REUS W/ TWL LRG LVL3 (GOWN DISPOSABLE) ×4 IMPLANT
GOWN STRL REUS W/TWL 2XL LVL3 (GOWN DISPOSABLE) ×4 IMPLANT
GOWN STRL REUS W/TWL LRG LVL3 (GOWN DISPOSABLE) ×8
GUIDEWIRE BALL NOSE 100CM (WIRE) ×3 IMPLANT
HFN LH 130 DEG 11MM X 380MM (Orthopedic Implant) ×3 IMPLANT
HIP FRA NAIL LAG SCREW 10.5X90 (Orthopedic Implant) ×4 IMPLANT
KIT BASIN OR (CUSTOM PROCEDURE TRAY) ×4 IMPLANT
KIT ROOM TURNOVER OR (KITS) ×4 IMPLANT
MANIFOLD NEPTUNE II (INSTRUMENTS) ×4 IMPLANT
NDL HYPO 21X1.5 SAFETY (NEEDLE) ×1 IMPLANT
NDL HYPO 25GX1X1/2 BEV (NEEDLE) ×1 IMPLANT
NEEDLE HYPO 21X1.5 SAFETY (NEEDLE) ×4 IMPLANT
NEEDLE HYPO 25GX1X1/2 BEV (NEEDLE) ×4 IMPLANT
NS IRRIG 1000ML POUR BTL (IV SOLUTION) ×4 IMPLANT
PACK ORTHO EXTREMITY (CUSTOM PROCEDURE TRAY) ×4 IMPLANT
PACK UNIVERSAL I (CUSTOM PROCEDURE TRAY) ×4 IMPLANT
PAD ARMBOARD 7.5X6 YLW CONV (MISCELLANEOUS) ×8 IMPLANT
PENCIL BUTTON HOLSTER BLD 10FT (ELECTRODE) ×4 IMPLANT
SCREW LAG HIP FRA NAIL 10.5X90 (Orthopedic Implant) ×1 IMPLANT
SPONGE LAP 18X18 X RAY DECT (DISPOSABLE) ×4 IMPLANT
STAPLER VISISTAT 35W (STAPLE) ×4 IMPLANT
SUCTION FRAZIER TIP 10 FR DISP (SUCTIONS) ×4 IMPLANT
SUT VIC AB 0 CT1 27 (SUTURE) ×4
SUT VIC AB 0 CT1 27XBRD ANBCTR (SUTURE) ×2 IMPLANT
SUT VIC AB 2-0 CT1 27 (SUTURE) ×4
SUT VIC AB 2-0 CT1 TAPERPNT 27 (SUTURE) ×2 IMPLANT
SYR BULB IRRIGATION 50ML (SYRINGE) ×4 IMPLANT
SYR CONTROL 10ML LL (SYRINGE) ×4 IMPLANT
TOWEL OR 17X24 6PK STRL BLUE (TOWEL DISPOSABLE) ×4 IMPLANT
TOWEL OR 17X26 10 PK STRL BLUE (TOWEL DISPOSABLE) ×4 IMPLANT
TUBE CONNECTING 12'X1/4 (SUCTIONS) ×1
TUBE CONNECTING 12X1/4 (SUCTIONS) ×3 IMPLANT
WATER STERILE IRR 1000ML POUR (IV SOLUTION) ×4 IMPLANT

## 2014-07-04 NOTE — Progress Notes (Signed)
Pt takes narcotics on a daily basis. He is aware that we have given him multiple narcotics  (IV & p.o.) in the recovery room. At this time I do not feel that it would be safe to give any additional meds. Will give report to floor RN and get pt back on his routine scheduled meds.

## 2014-07-04 NOTE — Anesthesia Preprocedure Evaluation (Signed)
Anesthesia Evaluation  Patient identified by MRN, date of birth, ID band Patient awake    Reviewed: Allergy & Precautions, NPO status , Patient's Chart, lab work & pertinent test results  Airway Mallampati: I  TM Distance: >3 FB Neck ROM: Full    Dental   Pulmonary COPDCurrent Smoker,          Cardiovascular     Neuro/Psych    GI/Hepatic GERD-  Medicated and Controlled,  Endo/Other    Renal/GU      Musculoskeletal   Abdominal   Peds  Hematology   Anesthesia Other Findings   Reproductive/Obstetrics                             Anesthesia Physical Anesthesia Plan  ASA: III  Anesthesia Plan: General   Post-op Pain Management:    Induction: Intravenous  Airway Management Planned: LMA  Additional Equipment:   Intra-op Plan:   Post-operative Plan: Extubation in OR  Informed Consent: I have reviewed the patients History and Physical, chart, labs and discussed the procedure including the risks, benefits and alternatives for the proposed anesthesia with the patient or authorized representative who has indicated his/her understanding and acceptance.     Plan Discussed with: CRNA and Surgeon  Anesthesia Plan Comments:         Anesthesia Quick Evaluation

## 2014-07-04 NOTE — Op Note (Signed)
NAMECHRYSTOPHER, [name] [name] NO.:  [id number]  MEDICAL RECORD NO.:  [id number]  LOCATION:  5N21C                        FACILITY:  [facility]  PHYSICIAN:  Monico Blitz. Arun Herrod, M.D.DATE OF BIRTH:  10/29/1965  DATE OF PROCEDURE:  07/04/2014 DATE OF DISCHARGE:                              OPERATIVE REPORT   PREOPERATIVE DIAGNOSIS:  Left femur metastasis.  POSTOPERATIVE DIAGNOSIS:  Left femur metastasis.  PROCEDURE:  Left femur trochanteric nail.  ANESTHESIA:  General.  ATTENDING SURGEON:  Monico Blitz. Rhona Raider, M.D.  ASSISTANT:  Loni Dolly, PA.  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  He has thinning of the cortices and it is feared that he might fracture.  He is offered intramedullary stabilization in hopes of preventing a fracture. Informed operative consent was obtained after discussion of possible complications including reaction to anesthesia, infection, DVT, and fracture.  SUMMARY OF FINDINGS AND PROCEDURE:  Under general anesthesia, through 2 small incisions, we placed an AFFIXUS trochanteric nail, which was 11 x 380.  This was locked proximally into the femoral head.  We did not lock distally though this was a very tight fit.  I used fluoroscopy throughout the case to make appropriate intraoperative decisions and read all of these views myself.  Loni Dolly assisted throughout and was invaluable to the completion of the case, mostly in that he maintained exposure and passed instruments while I performed the operation.  He also closed simultaneously to help minimize OR time.  DESCRIPTION OF PROCEDURE:  The patient was taken to the operating suite where general anesthetic was applied without difficulty.  He was positioned on a HANA table and all bony prominences were appropriately padded.  This leg was taken into slight adduction.  He was prepped and draped in normal sterile fashion.  After the  administration of IV Kefzol and an appropriate time-out, I made a small incision proximal to the greater trochanter.  Dissection was carried down to the trochanter and a guidewire was placed from the tip of the trochanter down in the intercondylar region.  We then used a starter reamer.  I then placed a guidewire down to the knee seen to be intramedullary on several fluoroscopic views.  I then over reamed to a diameter of 12.5 mm.  We then placed an 11 x 380 AFFIXUS nail.  This was very tight, but did seat fully.  We then place the proximal locking screw under fluoroscopic guidance.  He had excellent bone quality and we then locked this to the nail.  I did not lock distally as the nail was a very tight fit and I did not think it would add anything to our fixation.  Fluoroscopy confirmed good placement of his hardware in several planes.  The wounds were irrigated followed by reapproximation of deep tissues with 0-Vicryl and subcutaneous tissues with 2-0 undyed Vicryl.  Skin was closed with staples.  We placed 2 large Band-Aids.  Estimated blood loss and intraoperative fluids can be obtained from anesthesia records.  DISPOSITION:  The patient was extubated in the operating room and taken to the recovery room in a stable  addition.  He is to be admitted to the [facility] postoperatively for pain control and gradual mobilization with therapy.  We will use aspirin for pharmacologic DVT prophylaxis along with immediate mobilization for further prophylaxis.     Monico Blitz Rhona Raider, M.D.     PGD/MEDQ  D:  07/04/2014  T:  07/04/2014  Job:  [id number]

## 2014-07-04 NOTE — Brief Op Note (Signed)
[name] [name] [id number] 07/04/2014   PRE-OP DIAGNOSIS: left hip impending fracture  POST-OP DIAGNOSIS: same  PROCEDURE: left hip troch nail  ANESTHESIA: general  [name]   Dictation #:  [id number]

## 2014-07-04 NOTE — Transfer of Care (Signed)
Immediate Anesthesia Transfer of Care Note  Patient: [name] [name]  Procedure(s) Performed: Procedure(s): INTRAMEDULLARY (IM) NAIL INTERTROCHANTRIC (Left)  Patient Location: PACU  Anesthesia Type:General  Level of Consciousness: awake, alert  and pateint uncooperative  Airway & Oxygen Therapy: Patient Spontanous Breathing and Patient connected to face mask oxygen  Post-op Assessment: Report given to PACU RN, Post -op Vital signs reviewed and stable and Patient moving all extremities  Post vital signs: Reviewed and stable  Complications: No apparent anesthesia complications

## 2014-07-04 NOTE — Telephone Encounter (Signed)
Patient's mother Braysen Cloward called requesting to pick up oxycodone refill on Thursday, 07-06-2014.  Will notify provider.  Can reach her at [phone].

## 2014-07-04 NOTE — Progress Notes (Signed)
States very little relief from earlier IV med.  He does seem to drift off to sleep, but is easily arousable.  Additional IV pain medication given as ordered.

## 2014-07-04 NOTE — Interval H&P Note (Signed)
OK for surgery PD 

## 2014-07-04 NOTE — Anesthesia Postprocedure Evaluation (Signed)
Anesthesia Post Note  Patient: [name] [name]  Procedure(s) Performed: Procedure(s) (LRB): INTRAMEDULLARY (IM) NAIL INTERTROCHANTRIC (Left)  Anesthesia type: general  Patient location: PACU  Post pain: Pain level controlled  Post assessment: Patient's Cardiovascular Status Stable  Last Vitals:  Filed Vitals:   07/04/14 1733  BP: 144/78  Pulse: 118  Temp: 36.4 C  Resp:     Post vital signs: Reviewed and stable  Level of consciousness: sedated  Complications: No apparent anesthesia complications

## 2014-07-04 NOTE — Progress Notes (Addendum)
Agitated, pulling SM off , calling out for help and states repeatedly "I'm in bad shape man", beats on handrail with fist, follows commands, can be reasoned with

## 2014-07-05 ENCOUNTER — Telehealth: Payer: Self-pay | Admitting: Medical Oncology

## 2014-07-05 ENCOUNTER — Other Ambulatory Visit: Payer: Self-pay | Admitting: Medical Oncology

## 2014-07-05 DIAGNOSIS — C342 Malignant neoplasm of middle lobe, bronchus or lung: Secondary | ICD-10-CM

## 2014-07-05 MED ORDER — PNEUMOCOCCAL VAC POLYVALENT 25 MCG/0.5ML IJ INJ: 0.5000 mL | INJECTION | INTRAMUSCULAR | Status: DC

## 2014-07-05 MED ORDER — HYDROMORPHONE HCL 1 MG/ML IJ SOLN
INTRAMUSCULAR | Status: AC
  Administered 2014-07-05: 1 mg via INTRAVENOUS
  Filled 2014-07-05: qty 1

## 2014-07-05 MED ORDER — HYDROMORPHONE HCL 1 MG/ML IJ SOLN
0.5000 mg | INTRAMUSCULAR | Status: DC | PRN
  Administered 2014-07-05 – 2014-07-12 (×70): 1 mg via INTRAVENOUS
  Filled 2014-07-05 (×67): qty 1

## 2014-07-05 NOTE — Evaluation (Signed)
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  Clinical Impression  Pt admitted with above diagnosis. Pt currently with functional limitations due to the deficits listed below (see PT Problem List).  Pt will benefit from skilled PT to increase their independence and safety with mobility to allow discharge to the venue listed below.  Pt with high pain level 10/10 despite being pre-medicated before session. He declined ice packs.  Pt moving at MIN to MIN/guard level despite 10/10 pain.     Follow Up Recommendations Home health PT    Equipment Recommendations  None recommended by PT    Recommendations for Other Services       Precautions / Restrictions Precautions Precautions: Fall Restrictions LLE Weight Bearing: Weight bearing as tolerated      Mobility  Bed Mobility Overal bed mobility: Modified Independent             General bed mobility comments: HOB elevated and moves slowly, but able to with MOD I.  Transfers Overall transfer level: Needs assistance   Transfers: Sit to/from Stand Sit to Stand: Min guard         General transfer comment: cues for hand placement  Ambulation/Gait Ambulation/Gait assistance: Min guard Ambulation Distance (Feet): 3 Feet Assistive device: Rolling walker (2 wheeled) Gait Pattern/deviations: Antalgic;Step-to pattern     General Gait Details: Pt ambulated a few feet and then had to sit down due to extreme pain.  Pt sweating throughout session which he states is due to pain.    Stairs            Wheelchair Mobility    Modified Rankin (Stroke Patients Only)       Balance Overall balance assessment: Needs assistance Sitting-balance support: Feet supported Sitting balance-Leahy Scale: Good        Standing balance-Leahy Scale: Poor Standing balance comment: due to pain                             Pertinent Vitals/Pain Pain Assessment: 0-10 Pain Score: 10-Worst pain ever Pain Location: L hip Pain Intervention(s): Premedicated before session    Home Living Family/patient expects to be discharged to:: Private residence Living Arrangements: Alone Available Help at Discharge: Family;Available 24 hours/day Type of Home: Mobile home Home Access: Stairs to enter Entrance Stairs-Rails: Left Entrance Stairs-Number of Steps: 4 Home Layout: One level Home Equipment: Walker - 2 wheels;Other (comment);Bedside commode (lift chair) Additional Comments: sleeps in lift chair     Prior Function Level of Independence: Independent with assistive device(s)         Comments: Amb with RW due to pain in L hip     Hand Dominance        Extremity/Trunk Assessment   Upper Extremity Assessment: Overall WFL for tasks assessed           Lower Extremity Assessment: Overall WFL for tasks assessed;LLE deficits/detail   LLE Deficits / Details: Pt in bed with legs almost fully flexed upon arrival.  Would not allow hip abd assessment  Cervical / Trunk Assessment: Normal  Communication   Communication: No difficulties  Cognition Arousal/Alertness: Awake/alert Behavior During Therapy: WFL for tasks assessed/performed Overall Cognitive Status: Within Functional Limits  for tasks assessed                      General Comments General comments (skin integrity, edema, etc.): Pt sweating profusely upon arrival with pt in supine.  He states he was like that all night and it is due to pain. nursing aware.    Exercises        Assessment/Plan    PT Assessment Patient needs continued PT services  PT Diagnosis Difficulty walking;Acute pain   PT Problem List Decreased balance;Decreased mobility;Pain;Decreased activity tolerance;Decreased strength  PT Treatment  Interventions Gait training;Stair training;Functional mobility training;Therapeutic activities;Therapeutic exercise;DME instruction;Balance training   PT Goals (Current goals can be found in the Care Plan section) Acute Rehab PT Goals Patient Stated Goal: get pain down PT Goal Formulation: With patient Time For Goal Achievement: 07/19/14 Potential to Achieve Goals: Good    Frequency 7X/week   Barriers to discharge        Co-evaluation               End of Session Equipment Utilized During Treatment: Gait belt Activity Tolerance: Patient limited by pain Patient left: in chair;with call bell/phone within reach Nurse Communication: Mobility status;Patient requests pain meds         Time: 0347-4259 PT Time Calculation (min) (ACUTE ONLY): 12 min   Charges:   PT Evaluation $Initial PT Evaluation Tier I: 1 Procedure     PT G Codes:        Camar Guyton LUBECK 07/05/2014, 9:53 AM

## 2014-07-05 NOTE — Progress Notes (Signed)
Subjective: 1 Day Post-Op Procedure(s) (LRB): INTRAMEDULLARY (IM) NAIL INTERTROCHANTRIC (Left)   Patient laying in bed complaining of pain. He is sweating profusely.  He is on high-dose pain medicine at home for his metastatic cancer.  It does not appear that he was back on his full dose of home medications.  Activity level:  wbat Diet tolerance:  Eating ok Voiding:  ok Patient reports pain as moderate and severe.    Objective: Vital signs in last 24 hours: Temp:  [97.5 F (36.4 C)-99 F (37.2 C)] 98.2 F (36.8 C) (01/27 0447) Pulse Rate:  [105-139] 134 (01/27 0447) Resp:  [10-27] 18 (01/27 0447) BP: (119-144)/(62-85) 126/67 mmHg (01/27 0447) SpO2:  [87 %-96 %] 95 % (01/27 0447) Weight:  [92.987 kg (205 lb)] 92.987 kg (205 lb) (01/26 1115)  Labs:  Recent Labs  07/03/14 1436  HGB 12.2*    Recent Labs  07/03/14 1436  WBC 12.7*  RBC 3.77*  HCT 39.1  PLT 193    Recent Labs  07/03/14 1436  NA 139  K 4.5  CL 104  CO2 26  BUN 7  CREATININE 0.62  GLUCOSE 94  CALCIUM 8.3*    Recent Labs  07/03/14 1436  INR 0.99    Physical Exam:  Neurologically intact ABD soft Neurovascular intact Sensation intact distally Intact pulses distally Dorsiflexion/Plantar flexion intact Incision: dressing C/D/I No cellulitis present Compartment soft  Assessment/Plan:  1 Day Post-Op Procedure(s) (LRB): INTRAMEDULLARY (IM) NAIL INTERTROCHANTRIC (Left) Advance diet Up with therapy D/C IV fluids Plan for discharge tomorrow if doing well and cleared by physical therapy. We will keep him on 325 mg of aspirin twice a day for DVT prevention for 1 month. He will follow up in the office 2 weeks postop. We will increase that allotted he is getting IV to every 2 hours. We will make sure he is on his full dose of home medications.  If his pain is better controlled we will plan on trying to get him home tomorrow.   Mahdiya Mossberg, Larwance Sachs 07/05/2014, 8:47 AM

## 2014-07-05 NOTE — Progress Notes (Signed)
Utilization review completed. Marva Hendryx, RN, BSN. 

## 2014-07-05 NOTE — Telephone Encounter (Signed)
Left message for [name] [name] to return my call.

## 2014-07-05 NOTE — Telephone Encounter (Signed)
I left message for sarah to call me.

## 2014-07-06 ENCOUNTER — Inpatient Hospital Stay (HOSPITAL_COMMUNITY): Payer: Medicare Other

## 2014-07-06 ENCOUNTER — Telehealth: Payer: Self-pay | Admitting: Medical Oncology

## 2014-07-06 ENCOUNTER — Encounter (HOSPITAL_COMMUNITY): Payer: Self-pay | Admitting: Radiology

## 2014-07-06 DIAGNOSIS — A419 Sepsis, unspecified organism: Secondary | ICD-10-CM | POA: Diagnosis present

## 2014-07-06 DIAGNOSIS — J189 Pneumonia, unspecified organism: Secondary | ICD-10-CM

## 2014-07-06 DIAGNOSIS — C7951 Secondary malignant neoplasm of bone: Principal | ICD-10-CM

## 2014-07-06 LAB — URINE MICROSCOPIC-ADD ON

## 2014-07-06 LAB — URINALYSIS, ROUTINE W REFLEX MICROSCOPIC
Glucose, UA: NEGATIVE mg/dL
Ketones, ur: NEGATIVE mg/dL
Leukocytes, UA: NEGATIVE
Nitrite: NEGATIVE
Protein, ur: NEGATIVE mg/dL
Specific Gravity, Urine: >1.046 — ABNORMAL HIGH (ref 1.005–1.030)
Urobilinogen, UA: 1 mg/dL (ref 0.0–1.0)
pH: 6 (ref 5.0–8.0)

## 2014-07-06 LAB — COMPREHENSIVE METABOLIC PANEL
ALBUMIN: 2.8 g/dL — AB (ref 3.5–5.2)
ALK PHOS: 134 U/L — AB (ref 39–117)
ALT: 22 U/L (ref 0–53)
ANION GAP: 11 (ref 5–15)
AST: 16 U/L (ref 0–37)
BUN: 8 mg/dL (ref 6–23)
CHLORIDE: 99 mmol/L (ref 96–112)
CO2: 28 mmol/L (ref 19–32)
Calcium: 6.8 mg/dL — ABNORMAL LOW (ref 8.4–10.5)
Creatinine, Ser: 0.78 mg/dL (ref 0.50–1.35)
Glucose, Bld: 119 mg/dL — ABNORMAL HIGH (ref 70–99)
Potassium: 3.8 mmol/L (ref 3.5–5.1)
SODIUM: 138 mmol/L (ref 135–145)
Total Bilirubin: 1.5 mg/dL — ABNORMAL HIGH (ref 0.3–1.2)
Total Protein: 6.9 g/dL (ref 6.0–8.3)

## 2014-07-06 LAB — CBC WITH DIFFERENTIAL/PLATELET
Basophils Absolute: 0 10*3/uL (ref 0.0–0.1)
Basophils Relative: 0 % (ref 0–1)
EOS PCT: 4 % (ref 0–5)
Eosinophils Absolute: 0.6 10*3/uL (ref 0.0–0.7)
HCT: 36.5 % — ABNORMAL LOW (ref 39.0–52.0)
Hemoglobin: 11.4 g/dL — ABNORMAL LOW (ref 13.0–17.0)
LYMPHS ABS: 0.7 10*3/uL (ref 0.7–4.0)
Lymphocytes Relative: 5 % — ABNORMAL LOW (ref 12–46)
MCH: 31.8 pg (ref 26.0–34.0)
MCHC: 31.2 g/dL (ref 30.0–36.0)
MCV: 102 fL — AB (ref 78.0–100.0)
Monocytes Absolute: 1.1 10*3/uL — ABNORMAL HIGH (ref 0.1–1.0)
Monocytes Relative: 8 % (ref 3–12)
NEUTROS PCT: 83 % — AB (ref 43–77)
Neutro Abs: 12 10*3/uL — ABNORMAL HIGH (ref 1.7–7.7)
Platelets: 165 10*3/uL (ref 150–400)
RBC: 3.58 MIL/uL — AB (ref 4.22–5.81)
RDW: 18.1 % — AB (ref 11.5–15.5)
WBC: 14.5 10*3/uL — AB (ref 4.0–10.5)

## 2014-07-06 LAB — MRSA PCR SCREENING: MRSA by PCR: NEGATIVE

## 2014-07-06 LAB — D-DIMER, QUANTITATIVE: D-Dimer, Quant: 1.81 ug/mL-FEU — ABNORMAL HIGH (ref 0.00–0.48)

## 2014-07-06 LAB — LACTIC ACID, PLASMA: Lactic Acid, Venous: 1 mmol/L (ref 0.5–2.0)

## 2014-07-06 LAB — GLUCOSE, CAPILLARY: Glucose-Capillary: 132 mg/dL — ABNORMAL HIGH (ref 70–99)

## 2014-07-06 MED ORDER — HYDROMORPHONE HCL 1 MG/ML IJ SOLN
2.0000 mg | Freq: Once | INTRAMUSCULAR | Status: DC
  Filled 2014-07-06: qty 2

## 2014-07-06 MED ORDER — PIPERACILLIN-TAZOBACTAM 3.375 G IVPB
3.3750 g | Freq: Three times a day (TID) | INTRAVENOUS | Status: DC
  Administered 2014-07-06 – 2014-07-09 (×9): 3.375 g via INTRAVENOUS
  Filled 2014-07-06 (×13): qty 50

## 2014-07-06 MED ORDER — SODIUM CHLORIDE 0.9 % IV BOLUS (SEPSIS)
1000.0000 mL | Freq: Once | INTRAVENOUS | Status: AC
  Administered 2014-07-06: 1000 mL via INTRAVENOUS

## 2014-07-06 MED ORDER — ASPIRIN 81 MG PO CHEW
324.0000 mg | CHEWABLE_TABLET | Freq: Two times a day (BID) | ORAL | Status: DC
  Administered 2014-07-06 – 2014-07-08 (×5): 324 mg via ORAL
  Filled 2014-07-06 (×3): qty 4

## 2014-07-06 MED ORDER — VANCOMYCIN HCL IN DEXTROSE 1-5 GM/200ML-% IV SOLN
1000.0000 mg | Freq: Three times a day (TID) | INTRAVENOUS | Status: DC
  Administered 2014-07-06 – 2014-07-08 (×6): 1000 mg via INTRAVENOUS
  Filled 2014-07-06 (×10): qty 200

## 2014-07-06 MED ORDER — SODIUM CHLORIDE 0.9 % IV SOLN
INTRAVENOUS | Status: DC
  Administered 2014-07-06 – 2014-07-07 (×3): via INTRAVENOUS

## 2014-07-06 MED ORDER — OXYCODONE HCL ER 40 MG PO T12A
120.0000 mg | EXTENDED_RELEASE_TABLET | Freq: Two times a day (BID) | ORAL | Status: DC
Start: 2014-07-06 — End: 2014-07-12
  Administered 2014-07-06 – 2014-07-12 (×13): 120 mg via ORAL
  Filled 2014-07-06 (×9): qty 3
  Filled 2014-07-06: qty 12
  Filled 2014-07-06 (×3): qty 3

## 2014-07-06 MED ORDER — IOHEXOL 350 MG/ML SOLN
100.0000 mL | Freq: Once | INTRAVENOUS | Status: AC | PRN
  Administered 2014-07-06: 1 mL via INTRAVENOUS

## 2014-07-06 MED ORDER — HEPARIN (PORCINE) IN NACL 100-0.45 UNIT/ML-% IJ SOLN
1400.0000 [IU]/h | INTRAMUSCULAR | Status: DC
  Filled 2014-07-06: qty 250

## 2014-07-06 MED ORDER — HEPARIN BOLUS VIA INFUSION
5000.0000 [IU] | Freq: Once | INTRAVENOUS | Status: DC
  Filled 2014-07-06: qty 5000
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
## 2014-07-06 NOTE — Progress Notes (Signed)
PT Cancellation Note  Patient Details Name: [name] [name] MRN: [id number] DOB: 03/22/1966   Cancelled Treatment:    Reason Eval/Treat Not Completed: Medical issues which prohibited therapy. Spoke with RN, Rapid response called this morning on pt due to incr HR and decr O2 sats. Will hold on therapy until medically ready.    Elie Confer [place], [place] 07/06/2014, 10:25 AM

## 2014-07-06 NOTE — Progress Notes (Signed)
Subjective: 2 Days Post-Op Procedure(s) (LRB): INTRAMEDULLARY (IM) NAIL INTERTROCHANTRIC (Left)   Patient appears stable and well at this time. This morning he was found sweating profusely in bed and had a O2 sat in the 70's. Rapid response was called and his sats went up into the low 90s on 3L of oxygen. He is not currently complaining of any SOB or chest pain. He is asking about an increase in his pain meds since he is still having quite significant pain which is most likely from his stage IV cancer. He denies the use of any alcohol as i was also concerned that this may be withdrawal also.     Activity level: wbat Diet tolerance:  ok Voiding:  ok Patient reports pain as moderate.    Objective: Vital signs in last 24 hours: Temp:  [98.1 F (36.7 C)-101.4 F (38.6 C)] 101.4 F (38.6 C) (01/28 0825) Pulse Rate:  [123-153] 134 (01/28 0807) Resp:  [19-20] 20 (01/28 0549) BP: (98-127)/(58-66) 98/66 mmHg (01/28 0807) SpO2:  [89 %-93 %] 92 % (01/28 0832)  Labs:  Recent Labs  07/03/14 1436  HGB 12.2*    Recent Labs  07/03/14 1436  WBC 12.7*  RBC 3.77*  HCT 39.1  PLT 193    Recent Labs  07/03/14 1436  NA 139  K 4.5  CL 104  CO2 26  BUN 7  CREATININE 0.62  GLUCOSE 94  CALCIUM 8.3*    Recent Labs  07/03/14 1436  INR 0.99    Physical Exam:  Neurologically intact ABD soft Neurovascular intact Sensation intact distally Intact pulses distally Dorsiflexion/Plantar flexion intact Incision: dressing C/D/I No cellulitis present Compartment soft  Assessment/Plan:  2 Days Post-Op Procedure(s) (LRB): INTRAMEDULLARY (IM) NAIL INTERTROCHANTRIC (Left) Advance diet Up with therapy Discharge home with home health when stable and doing well. We will get a medicine consult due to rapid response this morning for low O2 sats, tachycardia, low grade fever, and the concern over possible PE since he is a high risk candidate for one due to his recent surgery and active  malignancy. He already has a chest x-ray ordered.We will get an EKG ordered also.  For now we will continue on 325mg  of ASA BID unless medicine has further recommendations.  We will also continue current pain meds at this time due to the risk of decreased pulmonary drive from the high dose of narcotics.  We greatly appreciate the input from the rapid response team and medicine.    Charde Macfarlane, Larwance Sachs 07/06/2014, 9:27 AM

## 2014-07-06 NOTE — Progress Notes (Signed)
ANTICOAGULATION and ANTIBIOTIC CONSULT NOTE - Initial Consult  Pharmacy Consult for heparin; vancomycin and zosyn Indication: r/o PE;   Allergies  Allergen Reactions  . Codeine Shortness Of Breath and Nausea And Vomiting       . Morphine Shortness Of Breath and Nausea And Vomiting       . Nicoderm [Nicotine] Other (See Comments)    "Flipped out "    Patient Measurements: Height: 5' 8.5" (174 cm) Weight: 205 lb (92.987 kg) IBW/kg (Calculated) : 69.55 Heparin Dosing Weight: 88 kg  Vital Signs: Temp: 101.4 F (38.6 C) (01/28 0825) Temp Source: Rectal (01/28 0825) BP: 116/62 mmHg (01/28 1016) Pulse Rate: 125 (01/28 1016)  Labs:  Recent Labs  07/03/14 1436 07/06/14 0903  HGB 12.2* 11.4*  HCT 39.1 36.5*  PLT 193 165  APTT 34  --   LABPROT 13.2  --   INR 0.99  --   CREATININE 0.62 0.78    Estimated Creatinine Clearance: 124.8 mL/min (by C-G formula based on Cr of 0.78).   Medical History: Past Medical History  Diagnosis Date  . Depression     bipolar  . Hyperlipidemia   . GERD (gastroesophageal reflux disease)     barrett's  . Bipolar 1 disorder   . Pneumonia   . Impaired fasting glucose 08/06/2013  . Normocytic anemia 08/05/2013  . Drug overdose 08/05/2013  . 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 Hx of radiation therapy 08/18/13- 08/25/13    sacrum/pelvis 2000 cGy in 5 fractions  . Head injury, closed, without LOC   . Cough   . Cancer     bone  . Lung cancer, middle lobe 08/22/2013  . Primary lung cancer with metastasis from lung to other site 08/22/2013    Assessment: Patient is a 49 y.o M with lung and metastatic spine cancer currently undergoing chemotherapy.  He was admitted on 1/26 for repair of hip fracture and was started on aspirin 324mg  BID post-op for VTE prophylaxis.  Patient became hypoxic and tachycardic this morning with elevated D-dimer.  To start heparin for r/o PE. Spoke to ortho PA, Loni Dolly- ok to give heparin bolus for pt.  Will also  start vancomycin and zosyn empirically for fever and leucokytosis.  Goal of Therapy:  Heparin level 0.3-0.7 units/ml; vancomycin trough 15-20 Monitor platelets by anticoagulation protocol: Yes   Plan:  - vancomycin 1gm IV q8h -zosyn 3.375 gm IV q8h over 4 hours - heparin 5000 units IV x1 bolus, then drip at 1400 units/hr -check 6 hour heparin level -f/u chest CT scan results  [name] [name] P 07/06/2014,10:27 AM

## 2014-07-06 NOTE — Telephone Encounter (Signed)
Pt still in hospital . [name] [name] stated pt does not have any pain med at home and requested refill. I told [name] [name] that  Dr Julien Nordmann is deferring pain meds to inpt physicians and assured her that they should provide rx for pain meds. At discharge.  I asked her to please let us know when he is discharged. [name] voiced understanding.

## 2014-07-06 NOTE — Significant Event (Addendum)
Rapid Response Event Note  Overview: Called to see pt for for tachycardia and diaphoresis. Time Called: 0806 Arrival Time: 0810 Event Type: Other (Comment) C  Initial Focused Assessment:  Pt noted to tachy in the 130s, (rythmn appeared regular, ST when placed on tele monitoring) sats low 90s on 3L, resps 22, reporting 10/10 pain, BP 98/66, rectal temp 101.4  Pt alert and oriented, denied CP, SOB, sts he has been diaphoretic overnight and that lack of pain control is his main concern.  Pt's LS clear, resps unlabored, regular.   Interventions: Called attending MD [facility]), orders for labs, CXR, telemetry monitoring.  MD to consult medicine and will send PA to see pt.  Pain meds admin by primary RN.  Labs drawn and PIV placed.  PA Mitzi Hansen to bedside at 0935.  On departure, BP 99/68, sats 94% on 3L, HR 125, resps 22, Medicine to bedside to see pt and orders for CTA, blood cultures, abx.  Hand off to primary RN Christine and RRT will follow up on pt.    - Pt taken to CT by RRT, then to [place], report from [place], [place] on 5N to [place] and bedside report given by RRT at time of transfer.    Event Summary: Name of Physician Notified: DR Rhona Raider at  (prior to RRT)  Name of Consulting Physician Notified:  (Dr to consult medicine) at    Outcome: Stayed in room and stabalized  Event End Time: 0920  Earna Coder

## 2014-07-07 LAB — CBC
HEMATOCRIT: 33 % — AB (ref 39.0–52.0)
Hemoglobin: 10.1 g/dL — ABNORMAL LOW (ref 13.0–17.0)
MCH: 31 pg (ref 26.0–34.0)
MCHC: 30.6 g/dL (ref 30.0–36.0)
MCV: 101.2 fL — ABNORMAL HIGH (ref 78.0–100.0)
PLATELETS: 129 10*3/uL — AB (ref 150–400)
RBC: 3.26 MIL/uL — AB (ref 4.22–5.81)
RDW: 18 % — ABNORMAL HIGH (ref 11.5–15.5)
WBC: 11.1 10*3/uL — AB (ref 4.0–10.5)

## 2014-07-07 MED ORDER — CETYLPYRIDINIUM CHLORIDE 0.05 % MT LIQD
7.0000 mL | Freq: Two times a day (BID) | OROMUCOSAL | Status: DC
  Administered 2014-07-07 – 2014-07-12 (×10): 7 mL via OROMUCOSAL

## 2014-07-07 MED ORDER — SENNA 8.6 MG PO TABS
2.0000 | ORAL_TABLET | Freq: Every day | ORAL | Status: DC
  Administered 2014-07-07 – 2014-07-12 (×6): 17.2 mg via ORAL
  Filled 2014-07-07 (×6): qty 2

## 2014-07-07 MED ORDER — GUAIFENESIN ER 600 MG PO TB12
1200.0000 mg | ORAL_TABLET | Freq: Two times a day (BID) | ORAL | Status: DC
  Administered 2014-07-07 – 2014-07-12 (×11): 1200 mg via ORAL
  Filled 2014-07-07 (×13): qty 2

## 2014-07-07 MED ORDER — IPRATROPIUM-ALBUTEROL 0.5-2.5 (3) MG/3ML IN SOLN: 3.0000 mL | Freq: Four times a day (QID) | RESPIRATORY_TRACT | Status: DC | PRN

## 2014-07-07 MED ORDER — CETYLPYRIDINIUM CHLORIDE 0.05 % MT LIQD: 7.0000 mL | Freq: Two times a day (BID) | OROMUCOSAL | Status: DC

## 2014-07-07 MED ORDER — OXYCODONE HCL 5 MG PO TABS
15.0000 mg | ORAL_TABLET | ORAL | Status: DC | PRN
  Administered 2014-07-07 – 2014-07-08 (×4): 15 mg via ORAL
  Filled 2014-07-07 (×5): qty 3

## 2014-07-07 NOTE — Progress Notes (Signed)
Physical Therapy Treatment Patient Details Name: [name] [name] MRN: [id number] DOB: [birth year] Today's Date: 07-09-14    History of Present Illness 49 y/o Wm with lung CA with mets to L hip and with impending L hip fx and so pt is now s/p L IM nailing to prevent fracture. On 07/06/13 pt developed sepsis/PNA and transferred to step down unit.    PT Comments    Pt making steady progress.  Follow Up Recommendations  Supervision - Intermittent     Equipment Recommendations  None recommended by PT    Recommendations for Other Services       Precautions / Restrictions Precautions Precautions: Fall Restrictions LLE Weight Bearing: Weight bearing as tolerated    Mobility  Bed Mobility Overal bed mobility: Modified Independent             General bed mobility comments: HOB elevated and moves slowly, uses rails but able to with MOD I.  Transfers Overall transfer level: Needs assistance Equipment used: Rolling walker (2 wheeled) Transfers: Sit to/from Stand Sit to Stand: Min guard;From elevated surface         General transfer comment: Raised bed since pt has lift chair at home.  Ambulation/Gait Ambulation/Gait assistance: Supervision Ambulation Distance (Feet): 15 Feet Assistive device: Rolling walker (2 wheeled) Gait Pattern/deviations: Step-to pattern;Antalgic   Gait velocity interpretation: at or above normal speed for age/gender General Gait Details: Pt limits distance due to pain but only requiring supervision.   Stairs            Wheelchair Mobility    Modified Rankin (Stroke Patients Only)       Balance Overall balance assessment: Needs assistance Sitting-balance support: Feet supported Sitting balance-Leahy Scale: Good       Standing balance-Leahy Scale: Poor Standing balance comment: support of walker.                    Cognition Arousal/Alertness: Awake/alert Behavior During Therapy: WFL for tasks  assessed/performed Overall Cognitive Status: Within Functional Limits for tasks assessed                      Exercises      General Comments        Pertinent Vitals/Pain Pain Assessment: Faces Faces Pain Scale: Hurts whole lot Pain Location: lt hip Pain Intervention(s): Limited activity within patient's tolerance;Monitored during session;Premedicated before session;Repositioned    Home Living                      Prior Function            PT Goals (current goals can now be found in the care plan section) Progress towards PT goals: Progressing toward goals    Frequency  Min 6X/week    PT Plan Current plan remains appropriate;Frequency needs to be updated    Co-evaluation             End of Session Equipment Utilized During Treatment: Gait belt;Oxygen Activity Tolerance: Patient limited by pain Patient left: in chair;with call bell/phone within reach     Time: 1225-1248 PT Time Calculation (min) (ACUTE ONLY): 23 min  Charges:  $Gait Training: 23-37 mins                    G Codes:      [name] Jul 09, 2014, 4:02 PM  [employer] PT [phone]

## 2014-07-07 NOTE — Progress Notes (Addendum)
[facility] PROGRESS NOTE  [name] [name] AOZ:[id number] DOB: 1965/07/29 DOA: 07/04/2014 PCP: [name], MD  Summary I have seen and examined [name] [name] at bedside and reviewed his chart. Hospitalist service following him for HCAP/Pain.  [name] [name] is a pleasant 49 year old male with a past medical history of lung cancer with metastasis to the left femur, as well as bipolar, history of drug overdose, and GERD and was admitted on January 26 for preemptive IM nailing of his left femur in order to prevent fracture secondary to metastasis. [name]. [name] successfully underwent nailing of his left femur on January 26. He has remained in the hospital for monitoring due to intractable pain tachycardia and low-grade fever. Early morning on January 28 he reportedly developed diaphoresis and his oxygen sats dropped into the 70s. Labs were obtained by the primary service and included a d-dimer (1.81) and a WBC (14.5). Chest x-ray showed no acute infiltrate or pulmonary edema.However, CT chest with contrast showed "..Multiple areas of interstitial and alveolar infiltrate consistent with multifocal pneumonia, new from prior study. There is also a new fairly small left pleural effusion". Blood cultures show no growth to date. He is on Vanc/Zosyn and seems to be defervescing. White count improved to 11,000 from 14000. Likely, pain meds masked respiratory symptoms. There is possibility of aspiration pneumonitis/postobstructive element. Patient says pain not controlled. He has most likely developed tolerance to narcotics. Will change oxycodone to 15mg (uses 20mg  at home) and reassess. Will also add senokot. Add Mucinex/Incentive spirometer/Albuterol. Plan Sepsis/HCAP (healthcare-associated pneumonia)  Follow cultures.  Continue Vanc/Zosyn per pharmacy to complete 7-10 days of antibiotics.  Add Mucinex  Incentive Spirometer/Albuterol Inhaler.  Hip pain/Chronic back pain/Lung cancer, middle lobe/Bone  metastases  Adjust Oxicodone dose.  Add senokot.  Defer rest of management to ortho/Oncology. DVT Prophylaxis. Code Status: Full code. Family Communication: No family at bedside. Disposition Plan: in SDU   Consultants:    Procedures:  Hip Nailing.  Antibiotics:  Vancomycin 07/06/14>  Zosyn 07/06/14.  HPI/Subjective: Still has pain in hip. Complaints of cough.  Objective: Filed Vitals:   07/07/14 0800  BP: 110/68  Pulse: 122  Temp: 98.1 F (36.7 C)  Resp: 12    Intake/Output Summary (Last 24 hours) at 07/07/14 0841 Last data filed at 07/07/14 0400  Gross per 24 hour  Intake   2040 ml  Output   1250 ml  Net    790 ml   Filed Weights   07/04/14 1115  Weight: 92.987 kg (205 lb)    Exam:   General:  Lying comfortably in bed  Cardiovascular: Regular tachycardia. S1S2 normal. No murmurs.  Respiratory: Coughing. Bilateral rhonchi. Wheezing  Abdomen: abdominal fullness  Musculoskeletal: No pedal edema.   Data Reviewed: Basic Metabolic Panel:  Recent Labs Lab 07/03/14 1436 07/06/14 0903  NA 139 138  K 4.5 3.8  CL 104 99  CO2 26 28  GLUCOSE 94 119*  BUN 7 8  CREATININE 0.62 0.78  CALCIUM 8.3* 6.8*   Liver Function Tests:  Recent Labs Lab 07/06/14 0903  AST 16  ALT 22  ALKPHOS 134*  BILITOT 1.5*  PROT 6.9  ALBUMIN 2.8*   No results for input(s): LIPASE, AMYLASE in the last 168 hours. No results for input(s): AMMONIA in the last 168 hours. CBC:  Recent Labs Lab 07/03/14 1436 07/06/14 0903 07/07/14 0345  WBC 12.7* 14.5* 11.1*  NEUTROABS 9.5* 12.0*  --   HGB 12.2* 11.4* 10.1*  HCT 39.1 36.5* 33.0*  MCV 103.7* 102.0*  101.2*  PLT 193 165 129*   Cardiac Enzymes: No results for input(s): CKTOTAL, CKMB, CKMBINDEX, TROPONINI in the last 168 hours. BNP (last 3 results) No results for input(s): PROBNP in the last 8760 hours. CBG:  Recent Labs Lab 07/06/14 0807  GLUCAP 132*    Recent Results (from the past 240 hour(s))   MRSA PCR Screening     Status: None   Collection Time: 07/06/14 11:20 AM  Result Value Ref Range Status   MRSA by PCR NEGATIVE NEGATIVE Final    Comment:        The GeneXpert MRSA Assay (FDA approved for NASAL specimens only), is one component of a comprehensive MRSA colonization surveillance program. It is not intended to diagnose MRSA infection nor to guide or monitor treatment for MRSA infections.   Culture, blood (routine x 2)     Status: None (Preliminary result)   Collection Time: 07/06/14 11:52 AM  Result Value Ref Range Status   Specimen Description BLOOD RIGHT ARM  Final   Special Requests BOTTLES DRAWN AEROBIC AND ANAEROBIC 10CC  Final   Culture   Final           BLOOD CULTURE RECEIVED NO GROWTH TO DATE CULTURE WILL BE HELD FOR 5 DAYS BEFORE ISSUING A FINAL NEGATIVE REPORT Performed at [facility]    Report Status PENDING  Incomplete  Culture, blood (routine x 2)     Status: None (Preliminary result)   Collection Time: 07/06/14 11:58 AM  Result Value Ref Range Status   Specimen Description BLOOD LEFT ARM  Final   Special Requests BOTTLES DRAWN AEROBIC AND ANAEROBIC 10CC  Final   Culture   Final           BLOOD CULTURE RECEIVED NO GROWTH TO DATE CULTURE WILL BE HELD FOR 5 DAYS BEFORE ISSUING A FINAL NEGATIVE REPORT Performed at [facility]    Report Status PENDING  Incomplete     Studies: Ct Angio Chest Pe W/cm &/or Wo Cm  07/06/2014   CLINICAL DATA:  Metastatic lung carcinoma with hypoxia and tachycardia  EXAM: CT ANGIOGRAPHY CHEST WITH CONTRAST  TECHNIQUE: Multidetector CT imaging of the chest was performed using the standard protocol during bolus administration of intravenous contrast. Multiplanar CT image reconstructions and MIPs were obtained to evaluate the vascular anatomy.  CONTRAST:  100 mL OMNIPAQUE IOHEXOL 350 MG/ML SOLN  COMPARISON:  Chest CT June 19, 2014 and chest radiograph July 06, 2014  FINDINGS: There is no demonstrable  pulmonary embolus. There is no thoracic aortic aneurysm or dissection.  There is underlying emphysematous change. There is new interstitial infiltrate in each lung apex, more on the right than on the left. There is interstitial infiltrate also noted in the anterior segment of the right upper lobe, new from recent study. There is patchy airspace consolidation in the medial segment right middle lobe and in the anterior segment of the left lower lobe, new. There is a new fairly small left pleural effusion with left base consolidation.  The previously noted mass in the lateral segment of the right middle lobe measures 4.4 x 3.4 cm, not significantly changed from recent prior study. There is adjacent atelectatic change in this area.  There is no appreciable thoracic adenopathy. There are subcentimeter mediastinal lymph nodes which do not meet size criteria for pathologic significance. There is a small pericardial effusion.  In the visualized upper abdomen, there is hepatic steatosis. There is stable right adrenal hypertrophy without well-defined mass. There  is mild prominence in the mid portion of the left adrenal ; the left adrenal is incompletely visualized.  There is bony metastatic disease at multiple sites. There is an expansile mixed sclerotic and lytic lesion in the inferior, medial scapula on the right. There are mixed blastic and lytic lesions throughout the thoracic and lumbar spine. There are multiple mixed sclerotic and lytic rib lesions. There is a predominantly lytic lesion in the proximal sternum, stable. There are no new appreciable bone lesions.  Review of the MIP images confirms the above findings.  IMPRESSION: No demonstrable pulmonary embolus.  Multiple areas of interstitial and alveolar infiltrate consistent with multifocal pneumonia, new from prior study. There is also a new fairly small left pleural effusion.  Essentially stable mass in the right middle lobe. No new mass. No adenopathy.  Extensive  bony metastatic disease, stable.  Adrenal prominence, stable.  Hepatic steatosis.   Electronically Signed   By: Lowella Grip M.D.   On: 07/06/2014 11:13   Dg Chest Port 1 View  07/06/2014   CLINICAL DATA:  Lung cancer, fever  EXAM: PORTABLE CHEST - 1 VIEW  COMPARISON:  06/19/2014  FINDINGS: Cardiomediastinal silhouette is stable. Right IJ Port-A-Cath with tip in distal SVC. No acute infiltrate or pulmonary edema. Again noted nodular mass in right middle lobe measures 3.9 cm. Again noted exophytic sclerotic lesion in right scapula and left anterior second rib consistent with metastatic disease.  IMPRESSION: No acute infiltrate or pulmonary edema. Again noted nodular mass in right middle lobe measures 3.9 cm. Again noted exophytic sclerotic lesion in right scapula and left anterior second rib consistent with metastatic disease.   Electronically Signed   By: Lahoma Crocker M.D.   On: 07/06/2014 09:21    Scheduled Meds: . antiseptic oral rinse  7 mL Mouth Rinse BID  . aspirin  324 mg Oral BID PC  . calcium carbonate  1 tablet Oral BID WC  . diazepam  5 mg Oral QHS  . docusate sodium  100 mg Oral BID  . folic acid  2.5 mg Oral BID  .  HYDROmorphone (DILAUDID) injection  2 mg Intravenous Once  . OxyCODONE  120 mg Oral Q12H  . pantoprazole  40 mg Oral Daily  . piperacillin-tazobactam (ZOSYN)  IV  3.375 g Intravenous Q8H  . pregabalin  50 mg Oral TID  . QUEtiapine  25 mg Oral TID  . QUEtiapine  400 mg Oral QHS  . senna  2 tablet Oral Daily  . vancomycin  1,000 mg Intravenous Q8H   Continuous Infusions: . sodium chloride 100 mL/hr at 07/06/14 1831  . lactated ringers Stopped (07/05/14 0832)       Time spent: 35 minutes.    Dmitriy Gair  [facility] Pager [phone].. If 7PM-7AM, please contact night-coverage at [email or web address], password [facility] 07/07/2014, 8:41 AM  LOS: 3 days

## 2014-07-07 NOTE — Progress Notes (Signed)
Subjective: 3 Days Post-Op Procedure(s) (LRB): INTRAMEDULLARY (IM) NAIL INTERTROCHANTRIC (Left)   Patient states that he is starting to feel better this morning. He appears much better than he has the last 2 days.   Activity level:  wbat Diet tolerance:  Eating well Voiding:  ok Patient reports pain as mild and moderate.    Objective: Vital signs in last 24 hours: Temp:  [97.5 F (36.4 C)-99.1 F (37.3 C)] 98.1 F (36.7 C) (01/29 0800) Pulse Rate:  [110-127] 122 (01/29 0800) Resp:  [9-18] 12 (01/29 0800) BP: (86-116)/(55-88) 110/68 mmHg (01/29 0800) SpO2:  [90 %-99 %] 96 % (01/29 0800)  Labs:  Recent Labs  07/06/14 0903 07/07/14 0345  HGB 11.4* 10.1*    Recent Labs  07/06/14 0903 07/07/14 0345  WBC 14.5* 11.1*  RBC 3.58* 3.26*  HCT 36.5* 33.0*  PLT 165 129*    Recent Labs  07/06/14 0903  NA 138  K 3.8  CL 99  CO2 28  BUN 8  CREATININE 0.78  GLUCOSE 119*  CALCIUM 6.8*   No results for input(s): LABPT, INR in the last 72 hours.  Physical Exam:  Neurologically intact ABD soft Neurovascular intact Sensation intact distally Intact pulses distally Dorsiflexion/Plantar flexion intact Incision: dressing C/D/I No cellulitis present Compartment soft  Assessment/Plan:  3 Days Post-Op Procedure(s) (LRB): INTRAMEDULLARY (IM) NAIL INTERTROCHANTRIC (Left) Advance diet Up with therapy  He is feeling better at this time.  He is currently on the medicine service. Pain appears well controlled. From an orthopedic standpoint he is good to go home. We will follow up with him in the office 2 weeks post op. We greatly appreciate medical teams management.    Amore Grater, Larwance Sachs 07/07/2014, 8:53 AM

## 2014-07-07 NOTE — Care Management Note (Signed)
    Page 1 of 1   07/07/2014     10:02:18 AM CARE MANAGEMENT NOTE 07/07/2014  Patient:  PHILMORE, LEPORE   Account Number:  [id number]  Date Initiated:  07/06/2014  Documentation initiated by:  [facility]  Subjective/Objective Assessment:   left femur fracture, s/p IM nail  has metastatic lung CA     Action/Plan:   PT/OT evals- recommended HHPT   Anticipated DC Date:     Anticipated DC Plan:  [place]  CM consult      Choice offered to / List presented to:             Status of service:  Completed, signed off Medicare Important Message given?  YES (If response is "NO", the following Medicare IM given date fields will be blank) Date Medicare IM given:  07/07/2014 Medicare IM given by:  Elissa Hefty Date Additional Medicare IM given:   Additional Medicare IM given by:    Discharge Disposition:    Per UR Regulation:    If discussed at Long Length of Stay Meetings, dates discussed:    Comments:  07/06/14 Spoke with patient on 07/05/14 about HHC. He stated that he is not familiar with any agencies and would like to have his mom decide on the [facility] agency. Left a [place] for his mom. Patient stated that he has a rolling walker, 3N1, hospital bed and recliner chair at home. He will have his mom helping him after d/c. Will continue to follow.

## 2014-07-08 LAB — CBC
HEMATOCRIT: 31 % — AB (ref 39.0–52.0)
HEMOGLOBIN: 9.6 g/dL — AB (ref 13.0–17.0)
MCH: 32 pg (ref 26.0–34.0)
MCHC: 31 g/dL (ref 30.0–36.0)
MCV: 103.3 fL — ABNORMAL HIGH (ref 78.0–100.0)
Platelets: 124 10*3/uL — ABNORMAL LOW (ref 150–400)
RBC: 3 MIL/uL — ABNORMAL LOW (ref 4.22–5.81)
RDW: 17.8 % — ABNORMAL HIGH (ref 11.5–15.5)
WBC: 8.1 10*3/uL (ref 4.0–10.5)

## 2014-07-08 LAB — COMPREHENSIVE METABOLIC PANEL
ALBUMIN: 2.4 g/dL — AB (ref 3.5–5.2)
ALT: 13 U/L (ref 0–53)
AST: 10 U/L (ref 0–37)
Alkaline Phosphatase: 119 U/L — ABNORMAL HIGH (ref 39–117)
Anion gap: 4 — ABNORMAL LOW (ref 5–15)
BILIRUBIN TOTAL: 0.9 mg/dL (ref 0.3–1.2)
BUN: <5 mg/dL — ABNORMAL LOW (ref 6–23)
CO2: 34 mmol/L — AB (ref 19–32)
Calcium: 6.7 mg/dL — ABNORMAL LOW (ref 8.4–10.5)
Chloride: 101 mmol/L (ref 96–112)
Creatinine, Ser: 0.72 mg/dL (ref 0.50–1.35)
Glucose, Bld: 91 mg/dL (ref 70–99)
Potassium: 3.4 mmol/L — ABNORMAL LOW (ref 3.5–5.1)
Sodium: 139 mmol/L (ref 135–145)
Total Protein: 6.2 g/dL (ref 6.0–8.3)

## 2014-07-08 LAB — MAGNESIUM: Magnesium: 1.3 mg/dL — ABNORMAL LOW (ref 1.5–2.5)

## 2014-07-08 LAB — PHOSPHORUS: Phosphorus: 2.8 mg/dL (ref 2.3–4.6)

## 2014-07-08 LAB — PROTIME-INR
INR: 1.3 (ref 0.00–1.49)
Prothrombin Time: 16.4 seconds — ABNORMAL HIGH (ref 11.6–15.2)

## 2014-07-08 MED ORDER — POTASSIUM CHLORIDE 20 MEQ/15ML (10%) PO SOLN
ORAL | Status: AC
  Filled 2014-07-08: qty 15

## 2014-07-08 MED ORDER — ENOXAPARIN SODIUM 40 MG/0.4ML ~~LOC~~ SOLN
40.0000 mg | SUBCUTANEOUS | Status: DC
Start: 2014-07-08 — End: 2014-07-12
  Administered 2014-07-08 – 2014-07-12 (×5): 40 mg via SUBCUTANEOUS
  Filled 2014-07-08 (×7): qty 0.4

## 2014-07-08 MED ORDER — POTASSIUM CHLORIDE 20 MEQ/15ML (10%) PO SOLN
40.0000 meq | Freq: Once | ORAL | Status: AC
  Administered 2014-07-08: 40 meq via ORAL
  Filled 2014-07-08: qty 30

## 2014-07-08 MED ORDER — ALBUTEROL SULFATE (2.5 MG/3ML) 0.083% IN NEBU: 2.5000 mg | INHALATION_SOLUTION | RESPIRATORY_TRACT | Status: DC | PRN

## 2014-07-08 MED ORDER — IPRATROPIUM-ALBUTEROL 0.5-2.5 (3) MG/3ML IN SOLN
3.0000 mL | Freq: Four times a day (QID) | RESPIRATORY_TRACT | Status: DC
  Administered 2014-07-08 – 2014-07-09 (×6): 3 mL via RESPIRATORY_TRACT
  Filled 2014-07-08 (×6): qty 3

## 2014-07-08 MED ORDER — OXYCODONE HCL 5 MG PO TABS
20.0000 mg | ORAL_TABLET | ORAL | Status: DC | PRN
  Administered 2014-07-09 – 2014-07-12 (×21): 20 mg via ORAL
  Filled 2014-07-08 (×21): qty 4

## 2014-07-08 MED ORDER — METHYLPREDNISOLONE SODIUM SUCC 40 MG IJ SOLR
40.0000 mg | Freq: Three times a day (TID) | INTRAMUSCULAR | Status: DC
  Administered 2014-07-08 – 2014-07-09 (×3): 40 mg via INTRAVENOUS
  Filled 2014-07-08 (×8): qty 1

## 2014-07-08 NOTE — Progress Notes (Signed)
[facility] PROGRESS NOTE  [name] [name] TDD:[id number] DOB: 1966/02/03 DOA: 07/04/2014 PCP: [name], MD  Summary Appreciate Orthopedics. Hospitalist assumed primary service role on [name] [name] to help manage HCAP/Pain. [name] [name] is a pleasant 49 year old male with a past medical history of lung cancer with metastasis to the left femur, as well as bipolar, history of drug overdose, tobacco dependency, narcotic dependency, as well as GERD and he was admitted on January 26 for preemptive IM nailing of his left femur in order to prevent fracture secondary to metastasis. [name] [name] successfully underwent nailing of his left femur on January 26. He has remained in the hospital for monitoring due to intractable pain tachycardia and low-grade fever. Early morning on January 28 he reportedly developed diaphoresis and his oxygen sats dropped into the 70s. Labs were obtained by the primary service and included a d-dimer (1.81) and a WBC (14.5). Chest x-ray showed no acute infiltrate or pulmonary edema.However, CT chest with contrast showed ". Multiple areas of interstitial and alveolar infiltrate consistent with multifocal pneumonia, new from prior study. There is also a new fairly small left pleural effusion". Blood cultures show no growth to date. He is on Vanc/Zosyn and has defervesced with improvement in white count to 8,000 from 14000. Likely, pain meds masked respiratory symptoms. There is possibility of aspiration pneumonitis/postobstructive element. He is still wheezing and desaturating off oxygen. Patient says pain not controlled. He has most likely developed tolerance to narcotics. Will change oxycodone to 20mg (uses 20mg  at home) and continue to reassess. Will also add Solumedrol as he is wheezing. Continue Mucinex/Incentive spirometer/Albuterol/Atrovent- scheduled and with prn albuterol. Will discontinue Vancomycin as  Plan Sepsis/HCAP (healthcare-associated pneumonia)/?Bronchitis  Follow  cultures.  Continue Zosyn per pharmacy to complete 7-10 days of antibiotics. Discontinue Vancomycin.  Add Solumderol  Incentive Spirometer/Albuterol/Atrovent/Oxygen supplementation as possible. Hip pain/Chronic back pain/Lung cancer, middle lobe/Bone metastases  Adjust Oxycodone dose as possible  Add senokot.  Defer rest of management to ortho/Oncology. DVT Prophylaxis: On Scds, add lovenox as high risk for DVT, monitor platelets. Code Status: Full code. Family Communication: No family at bedside. Disposition Plan: Keep in SDU   Consultants:  Orthopedics.  Procedures:  Hip Nailing.  Antibiotics:  Vancomycin 07/06/14> 07/08/14  Zosyn 07/06/14>  HPI/Subjective: Complaints of pain.  Objective: Filed Vitals:   07/08/14 0700  BP: 123/65  Pulse: 115  Temp: 98.1 F (36.7 C)  Resp: 18    Intake/Output Summary (Last 24 hours) at 07/08/14 0813 Last data filed at 07/08/14 0600  Gross per 24 hour  Intake 3211.67 ml  Output   2725 ml  Net 486.67 ml   Filed Weights   07/04/14 1115  Weight: 92.987 kg (205 lb)    Exam:   General:  Appears in less distress  Cardiovascular: Regular tachycardia. S1S2 normal.  Respiratory: Bilateral rhonchi and wheezing.  Abdomen: soft and non tender. +BS  Musculoskeletal: No pedal edema.   Data Reviewed: Basic Metabolic Panel:  Recent Labs Lab 07/03/14 1436 07/06/14 0903 07/08/14 0410  NA 139 138 139  K 4.5 3.8 3.4*  CL 104 99 101  CO2 26 28 34*  GLUCOSE 94 119* 91  BUN 7 8 <5*  CREATININE 0.62 0.78 0.72  CALCIUM 8.3* 6.8* 6.7*  MG  --   --  1.3*  PHOS  --   --  2.8   Liver Function Tests:  Recent Labs Lab 07/06/14 0903 07/08/14 0410  AST 16 10  ALT 22 13  ALKPHOS 134* 119*  BILITOT 1.5* 0.9  PROT 6.9 6.2  ALBUMIN 2.8* 2.4*   No results for input(s): LIPASE, AMYLASE in the last 168 hours. No results for input(s): AMMONIA in the last 168 hours. CBC:  Recent Labs Lab 07/03/14 1436 07/06/14 0903  07/07/14 0345 07/08/14 0410  WBC 12.7* 14.5* 11.1* 8.1  NEUTROABS 9.5* 12.0*  --   --   HGB 12.2* 11.4* 10.1* 9.6*  HCT 39.1 36.5* 33.0* 31.0*  MCV 103.7* 102.0* 101.2* 103.3*  PLT 193 165 129* 124*   Cardiac Enzymes: No results for input(s): CKTOTAL, CKMB, CKMBINDEX, TROPONINI in the last 168 hours. BNP (last 3 results) No results for input(s): PROBNP in the last 8760 hours. CBG:  Recent Labs Lab 07/06/14 0807  GLUCAP 132*    Recent Results (from the past 240 hour(s))  MRSA PCR Screening     Status: None   Collection Time: 07/06/14 11:20 AM  Result Value Ref Range Status   MRSA by PCR NEGATIVE NEGATIVE Final    Comment:        The GeneXpert MRSA Assay (FDA approved for NASAL specimens only), is one component of a comprehensive MRSA colonization surveillance program. It is not intended to diagnose MRSA infection nor to guide or monitor treatment for MRSA infections.   Culture, blood (routine x 2)     Status: None (Preliminary result)   Collection Time: 07/06/14 11:52 AM  Result Value Ref Range Status   Specimen Description BLOOD RIGHT ARM  Final   Special Requests BOTTLES DRAWN AEROBIC AND ANAEROBIC 10CC  Final   Culture   Final           BLOOD CULTURE RECEIVED NO GROWTH TO DATE CULTURE WILL BE HELD FOR 5 DAYS BEFORE ISSUING A FINAL NEGATIVE REPORT Performed at [facility]    Report Status PENDING  Incomplete  Culture, blood (routine x 2)     Status: None (Preliminary result)   Collection Time: 07/06/14 11:58 AM  Result Value Ref Range Status   Specimen Description BLOOD LEFT ARM  Final   Special Requests BOTTLES DRAWN AEROBIC AND ANAEROBIC 10CC  Final   Culture   Final           BLOOD CULTURE RECEIVED NO GROWTH TO DATE CULTURE WILL BE HELD FOR 5 DAYS BEFORE ISSUING A FINAL NEGATIVE REPORT Performed at [facility]    Report Status PENDING  Incomplete     Studies: Ct Angio Chest Pe W/cm &/or Wo Cm  07/06/2014   CLINICAL DATA:  Metastatic  lung carcinoma with hypoxia and tachycardia  EXAM: CT ANGIOGRAPHY CHEST WITH CONTRAST  TECHNIQUE: Multidetector CT imaging of the chest was performed using the standard protocol during bolus administration of intravenous contrast. Multiplanar CT image reconstructions and MIPs were obtained to evaluate the vascular anatomy.  CONTRAST:  100 mL OMNIPAQUE IOHEXOL 350 MG/ML SOLN  COMPARISON:  Chest CT June 19, 2014 and chest radiograph July 06, 2014  FINDINGS: There is no demonstrable pulmonary embolus. There is no thoracic aortic aneurysm or dissection.  There is underlying emphysematous change. There is new interstitial infiltrate in each lung apex, more on the right than on the left. There is interstitial infiltrate also noted in the anterior segment of the right upper lobe, new from recent study. There is patchy airspace consolidation in the medial segment right middle lobe and in the anterior segment of the left lower lobe, new. There is a new fairly small left pleural effusion with left base consolidation.  The previously noted mass in the lateral segment of the right middle lobe measures 4.4 x 3.4 cm, not significantly changed from recent prior study. There is adjacent atelectatic change in this area.  There is no appreciable thoracic adenopathy. There are subcentimeter mediastinal lymph nodes which do not meet size criteria for pathologic significance. There is a small pericardial effusion.  In the visualized upper abdomen, there is hepatic steatosis. There is stable right adrenal hypertrophy without well-defined mass. There is mild prominence in the mid portion of the left adrenal ; the left adrenal is incompletely visualized.  There is bony metastatic disease at multiple sites. There is an expansile mixed sclerotic and lytic lesion in the inferior, medial scapula on the right. There are mixed blastic and lytic lesions throughout the thoracic and lumbar spine. There are multiple mixed sclerotic and lytic  rib lesions. There is a predominantly lytic lesion in the proximal sternum, stable. There are no new appreciable bone lesions.  Review of the MIP images confirms the above findings.  IMPRESSION: No demonstrable pulmonary embolus.  Multiple areas of interstitial and alveolar infiltrate consistent with multifocal pneumonia, new from prior study. There is also a new fairly small left pleural effusion.  Essentially stable mass in the right middle lobe. No new mass. No adenopathy.  Extensive bony metastatic disease, stable.  Adrenal prominence, stable.  Hepatic steatosis.   Electronically Signed   By: Lowella Grip M.D.   On: 07/06/2014 11:13   Dg Chest Port 1 View  07/06/2014   CLINICAL DATA:  Lung cancer, fever  EXAM: PORTABLE CHEST - 1 VIEW  COMPARISON:  06/19/2014  FINDINGS: Cardiomediastinal silhouette is stable. Right IJ Port-A-Cath with tip in distal SVC. No acute infiltrate or pulmonary edema. Again noted nodular mass in right middle lobe measures 3.9 cm. Again noted exophytic sclerotic lesion in right scapula and left anterior second rib consistent with metastatic disease.  IMPRESSION: No acute infiltrate or pulmonary edema. Again noted nodular mass in right middle lobe measures 3.9 cm. Again noted exophytic sclerotic lesion in right scapula and left anterior second rib consistent with metastatic disease.   Electronically Signed   By: Lahoma Crocker M.D.   On: 07/06/2014 09:21    Scheduled Meds: . antiseptic oral rinse  7 mL Mouth Rinse BID  . aspirin  324 mg Oral BID PC  . calcium carbonate  1 tablet Oral BID WC  . diazepam  5 mg Oral QHS  . docusate sodium  100 mg Oral BID  . folic acid  2.5 mg Oral BID  . guaiFENesin  1,200 mg Oral BID  .  HYDROmorphone (DILAUDID) injection  2 mg Intravenous Once  . ipratropium-albuterol  3 mL Nebulization QID  . methylPREDNISolone (SOLU-MEDROL) injection  40 mg Intravenous 3 times per day  . OxyCODONE  120 mg Oral Q12H  . pantoprazole  40 mg Oral Daily  .  piperacillin-tazobactam (ZOSYN)  IV  3.375 g Intravenous Q8H  . potassium chloride  40 mEq Oral Once  . pregabalin  50 mg Oral TID  . QUEtiapine  25 mg Oral TID  . QUEtiapine  400 mg Oral QHS  . senna  2 tablet Oral Daily   Continuous Infusions:   Principal Problem:   Bone metastases Active Problems:   Sepsis   HCAP (healthcare-associated pneumonia)   Hip pain   Chronic back pain   Lung cancer, middle lobe    Time spent: 25 minutes.    Carlyn Lemke  [facility] Pager  [phone]. If 7PM-7AM, please contact night-coverage at [email or web address], password [facility] 07/08/2014, 8:13 AM  LOS: 4 days

## 2014-07-08 NOTE — Progress Notes (Signed)
Physical Therapy Treatment Patient Details Name: HASON OFARRELL MRN: [id number] DOB: 1965/07/07 Today's Date: 07/09/2014    History of Present Illness 49 y/o Wm with lung CA with mets to L hip and with impending L hip fx and so pt is now s/p L IM nailing to prevent fracture. On 07/06/13 pt developed sepsis/PNA and transferred to step down unit.    PT Comments    Pt making steady progress.  Follow Up Recommendations  Supervision - Intermittent;Home health PT     Equipment Recommendations  None recommended by PT    Recommendations for Other Services       Precautions / Restrictions Precautions Precautions: Fall Restrictions LLE Weight Bearing: Weight bearing as tolerated    Mobility  Bed Mobility Overal bed mobility: Needs Assistance Bed Mobility: Supine to Sit;Sit to Supine     Supine to sit: Modified independent (Device/Increase time);HOB elevated Sit to supine: Min assist   General bed mobility comments: assist to bring legs back up into bed  Transfers Overall transfer level: Needs assistance Equipment used: Rolling walker (2 wheeled) Transfers: Sit to/from Stand Sit to Stand: Supervision         General transfer comment: Raised bed since pt has lift chair at home.  Ambulation/Gait Ambulation/Gait assistance: Supervision Ambulation Distance (Feet): 90 Feet Assistive device: Rolling walker (2 wheeled) Gait Pattern/deviations: Step-through pattern;Decreased stance time - left;Decreased step length - right;Antalgic   Gait velocity interpretation: Below normal speed for age/gender General Gait Details: Steady gait with walker able to get lt foot flat.   Stairs            Wheelchair Mobility    Modified Rankin (Stroke Patients Only)       Balance     Sitting balance-Leahy Scale: Good     Standing balance support: Bilateral upper extremity supported Standing balance-Leahy Scale: Poor Standing balance comment: support of walker.                     Cognition Arousal/Alertness: Awake/alert Behavior During Therapy: WFL for tasks assessed/performed Overall Cognitive Status: Within Functional Limits for tasks assessed                      Exercises      General Comments        Pertinent Vitals/Pain Pain Assessment: Faces Faces Pain Scale: Hurts even more Pain Location: lt hip Pain Intervention(s): Limited activity within patient's tolerance;Monitored during session;Repositioned    Home Living                      Prior Function            PT Goals (current goals can now be found in the care plan section) Progress towards PT goals: Progressing toward goals    Frequency  Min 6X/week    PT Plan Current plan remains appropriate    Co-evaluation             End of Session Equipment Utilized During Treatment: Gait belt;Oxygen Activity Tolerance: Patient tolerated treatment well Patient left: in bed;with call bell/phone within reach     Time: 9381-0175 PT Time Calculation (min) (ACUTE ONLY): 16 min  Charges:  $Gait Training: 8-22 mins                    G Codes:      Keyonni Percival 07-09-2014, 4:36 PM  [facility] PT [phone]

## 2014-07-08 NOTE — Progress Notes (Signed)
Subjective: 4 Days Post-Op Procedure(s) (LRB): INTRAMEDULLARY (IM) NAIL INTERTROCHANTRIC (Left)   Patient states that he is feeling better this morning. He is still having some issues with pain control but overall it is tolerable.   Activity level:  wbat Diet tolerance:  Eating well Voiding:  ok Patient reports pain as mild.    Objective: Vital signs in last 24 hours: Temp:  [98 F (36.7 C)-98.2 F (36.8 C)] 98.1 F (36.7 C) (01/30 0700) Pulse Rate:  [98-118] 115 (01/30 0700) Resp:  [10-18] 18 (01/30 0700) BP: (101-123)/(52-95) 123/65 mmHg (01/30 0700) SpO2:  [94 %-99 %] 97 % (01/30 0700)  Labs:  Recent Labs  07/06/14 0903 07/07/14 0345 07/08/14 0410  HGB 11.4* 10.1* 9.6*    Recent Labs  07/07/14 0345 07/08/14 0410  WBC 11.1* 8.1  RBC 3.26* 3.00*  HCT 33.0* 31.0*  PLT 129* 124*    Recent Labs  07/06/14 0903 07/08/14 0410  NA 138 139  K 3.8 3.4*  CL 99 101  CO2 28 34*  BUN 8 <5*  CREATININE 0.78 0.72  GLUCOSE 119* 91  CALCIUM 6.8* 6.7*    Recent Labs  07/08/14 0410  INR 1.30    Physical Exam:  Neurologically intact ABD soft Neurovascular intact Sensation intact distally Intact pulses distally Dorsiflexion/Plantar flexion intact Incision: dressing C/D/I and no drainage No cellulitis present Compartment soft  Assessment/Plan:  4 Days Post-Op Procedure(s) (LRB): INTRAMEDULLARY (IM) NAIL INTERTROCHANTRIC (Left) Advance diet Up with therapy  He is continuing to feel better at this time.  He is currently on the medicine service. Pain appears well controlled i spoke with the nurses to make sure that he gets his meds as scheduled. From an orthopedic standpoint he is good to go home. We will follow up with him in the office 2 weeks post op. We greatly appreciate medical teams management.   Rashi Granier, Larwance Sachs 07/08/2014, 8:02 AM

## 2014-07-09 LAB — CBC
HCT: 31.1 % — ABNORMAL LOW (ref 39.0–52.0)
HEMOGLOBIN: 9.3 g/dL — AB (ref 13.0–17.0)
MCH: 30.9 pg (ref 26.0–34.0)
MCHC: 29.9 g/dL — AB (ref 30.0–36.0)
MCV: 103.3 fL — ABNORMAL HIGH (ref 78.0–100.0)
Platelets: 142 10*3/uL — ABNORMAL LOW (ref 150–400)
RBC: 3.01 MIL/uL — AB (ref 4.22–5.81)
RDW: 17.2 % — AB (ref 11.5–15.5)
WBC: 9 10*3/uL (ref 4.0–10.5)

## 2014-07-09 LAB — BASIC METABOLIC PANEL
Anion gap: 8 (ref 5–15)
BUN: 9 mg/dL (ref 6–23)
CHLORIDE: 101 mmol/L (ref 96–112)
CO2: 30 mmol/L (ref 19–32)
Calcium: 7.4 mg/dL — ABNORMAL LOW (ref 8.4–10.5)
Creatinine, Ser: 0.71 mg/dL (ref 0.50–1.35)
GFR calc Af Amer: >90 mL/min (ref >90)
GFR calc non Af Amer: >90 mL/min (ref >90)
GLUCOSE: 216 mg/dL — AB (ref 70–99)
Potassium: 5 mmol/L (ref 3.5–5.1)
SODIUM: 139 mmol/L (ref 135–145)

## 2014-07-09 LAB — MAGNESIUM: Magnesium: 1.6 mg/dL (ref 1.5–2.5)

## 2014-07-09 LAB — PHOSPHORUS: Phosphorus: 2.9 mg/dL (ref 2.3–4.6)

## 2014-07-09 MED ORDER — IPRATROPIUM-ALBUTEROL 0.5-2.5 (3) MG/3ML IN SOLN
3.0000 mL | Freq: Three times a day (TID) | RESPIRATORY_TRACT | Status: DC
  Administered 2014-07-09 – 2014-07-11 (×7): 3 mL via RESPIRATORY_TRACT
  Filled 2014-07-09 (×8): qty 3

## 2014-07-09 MED ORDER — PREDNISONE 20 MG PO TABS
40.0000 mg | ORAL_TABLET | Freq: Every day | ORAL | Status: DC
  Administered 2014-07-09 – 2014-07-10 (×2): 40 mg via ORAL
  Filled 2014-07-09 (×3): qty 2

## 2014-07-09 MED ORDER — LEVOFLOXACIN 750 MG PO TABS
750.0000 mg | ORAL_TABLET | Freq: Every day | ORAL | Status: AC
  Administered 2014-07-09 – 2014-07-12 (×4): 750 mg via ORAL
  Filled 2014-07-09 (×4): qty 1

## 2014-07-09 NOTE — Progress Notes (Signed)
[facility] PROGRESS NOTE  [name] [name] ZHY:[id number] DOB: 1965-07-22 DOA: 07/04/2014 PCP: [name], MD  Summary [facility]. Hospitalist assumed primary service role on [name] [name] to help manage HCAP/Pain. [name] [name] is a pleasant 49 year old male with a past medical history of lung cancer with metastasis to the left femur, as well as bipolar, history of drug overdose, tobacco dependency, narcotic dependency, as well as GERD and he was admitted on January 26 for preemptive IM nailing of his left femur in order to prevent fracture secondary to metastasis. [name] [name] successfully underwent nailing of his left femur on January 26. He remained in the hospital for monitoring due to intractable pain tachycardia and low-grade fever. Early morning on January 28 he reportedly developed diaphoresis and his oxygen sats dropped into the 70s. Labs were obtained by the primary service and included a d-dimer (1.81) and a WBC (14.5). Chest x-ray showed no acute infiltrate or pulmonary edema.However, CT chest with contrast showed ". Multiple areas of interstitial and alveolar infiltrate consistent with multifocal pneumonia, new from prior study. There is also a new fairly small left pleural effusion". Blood cultures show no growth to date. He is on Zosyn and has remained afebrile with improvement in white count to 9,000 from 14000. Likely, pain meds masked respiratory symptoms. There is possibility of aspiration pneumonitis/postobstructive element. He is no longer wheezing but still requires supplementary oxygen. Patient says pain not controlled. He has most likely developed tolerance to narcotics. He no longer wheezing, therefore will switch him to prednsione. Continue Mucinex/Incentive spirometer/Albuterol/Atrovent- scheduled and with prn albuterol. Will discontinue Zosyn and place him on oral Levaquin to complete 4 more days. He is stable for transfer to Med-Surg unit. Plan Sepsis/HCAP  (healthcare-associated pneumonia)/?Bronchitis  Cultures remain negative  Discontinue Zosyn. Start Levaquin oral.  D/c Solumedrol. Start Prednisone.  Incentive Spirometer/Albuterol/Atrovent/Oxygen supplementation as needed Hip pain/Chronic back pain/Lung cancer, middle lobe/Bone metastases  Adjust Oxycodone dose as possible  Add senokot.  Defer rest of management to ortho/Oncology. DVT Prophylaxis: On Scds, and lovenox as high risk for DVT, monitor platelets. Code Status: Full code. Family Communication: No family at bedside. Disposition Plan: Transfer to Med-Surg.   Consultants:  Orthopedics.  Procedures:  Hip Nailing.  Antibiotics:  Vancomycin 07/06/14> 07/08/14  Zosyn 07/06/14>07/09/14  Levaquin 07/09/14>  HPI/Subjective: Complaints of pain.  Objective: Filed Vitals:   07/09/14 0533  BP:   Pulse: 86  Temp:   Resp:     Intake/Output Summary (Last 24 hours) at 07/09/14 0731 Last data filed at 07/09/14 0400  Gross per 24 hour  Intake   1620 ml  Output   1700 ml  Net    -80 ml   Filed Weights   07/04/14 1115  Weight: 92.987 kg (205 lb)    Exam:   General:  Lying comfortably in bed. Not in distress.  Cardiovascular: Regular tachycardia around 105. S1S2 normal. No murmurs.  Respiratory: No wheezing.  Abdomen: soft and non tender. +BS.  Musculoskeletal: No pedal edema.   Data Reviewed: Basic Metabolic Panel:  Recent Labs Lab 07/03/14 1436 07/06/14 0903 07/08/14 0410 07/09/14 0245  NA 139 138 139 139  K 4.5 3.8 3.4* 5.0  CL 104 99 101 101  CO2 26 28 34* 30  GLUCOSE 94 119* 91 216*  BUN 7 8 <5* 9  CREATININE 0.62 0.78 0.72 0.71  CALCIUM 8.3* 6.8* 6.7* 7.4*  MG  --   --  1.3* 1.6  PHOS  --   --  2.8  2.9   Liver Function Tests:  Recent Labs Lab 07/06/14 0903 07/08/14 0410  AST 16 10  ALT 22 13  ALKPHOS 134* 119*  BILITOT 1.5* 0.9  PROT 6.9 6.2  ALBUMIN 2.8* 2.4*   No results for input(s): LIPASE, AMYLASE in the last 168  hours. No results for input(s): AMMONIA in the last 168 hours. CBC:  Recent Labs Lab 07/03/14 1436 07/06/14 0903 07/07/14 0345 07/08/14 0410 07/09/14 0245  WBC 12.7* 14.5* 11.1* 8.1 9.0  NEUTROABS 9.5* 12.0*  --   --   --   HGB 12.2* 11.4* 10.1* 9.6* 9.3*  HCT 39.1 36.5* 33.0* 31.0* 31.1*  MCV 103.7* 102.0* 101.2* 103.3* 103.3*  PLT 193 165 129* 124* 142*   Cardiac Enzymes: No results for input(s): CKTOTAL, CKMB, CKMBINDEX, TROPONINI in the last 168 hours. BNP (last 3 results) No results for input(s): PROBNP in the last 8760 hours. CBG:  Recent Labs Lab 07/06/14 0807  GLUCAP 132*    Recent Results (from the past 240 hour(s))  MRSA PCR Screening     Status: None   Collection Time: 07/06/14 11:20 AM  Result Value Ref Range Status   MRSA by PCR NEGATIVE NEGATIVE Final    Comment:        The GeneXpert MRSA Assay (FDA approved for NASAL specimens only), is one component of a comprehensive MRSA colonization surveillance program. It is not intended to diagnose MRSA infection nor to guide or monitor treatment for MRSA infections.   Culture, blood (routine x 2)     Status: None (Preliminary result)   Collection Time: 07/06/14 11:52 AM  Result Value Ref Range Status   Specimen Description BLOOD RIGHT ARM  Final   Special Requests BOTTLES DRAWN AEROBIC AND ANAEROBIC 10CC  Final   Culture   Final           BLOOD CULTURE RECEIVED NO GROWTH TO DATE CULTURE WILL BE HELD FOR 5 DAYS BEFORE ISSUING A FINAL NEGATIVE REPORT Performed at [facility]    Report Status PENDING  Incomplete  Culture, blood (routine x 2)     Status: None (Preliminary result)   Collection Time: 07/06/14 11:58 AM  Result Value Ref Range Status   Specimen Description BLOOD LEFT ARM  Final   Special Requests BOTTLES DRAWN AEROBIC AND ANAEROBIC 10CC  Final   Culture   Final           BLOOD CULTURE RECEIVED NO GROWTH TO DATE CULTURE WILL BE HELD FOR 5 DAYS BEFORE ISSUING A FINAL NEGATIVE  REPORT Performed at [facility]    Report Status PENDING  Incomplete     Studies: No results found.  Scheduled Meds: . antiseptic oral rinse  7 mL Mouth Rinse BID  . calcium carbonate  1 tablet Oral BID WC  . diazepam  5 mg Oral QHS  . docusate sodium  100 mg Oral BID  . enoxaparin (LOVENOX) injection  40 mg Subcutaneous Q24H  . folic acid  2.5 mg Oral BID  . guaiFENesin  1,200 mg Oral BID  . ipratropium-albuterol  3 mL Nebulization QID  . levofloxacin  750 mg Oral Daily  . methylPREDNISolone (SOLU-MEDROL) injection  40 mg Intravenous Q8H  . OxyCODONE  120 mg Oral Q12H  . pantoprazole  40 mg Oral Daily  . pregabalin  50 mg Oral TID  . QUEtiapine  25 mg Oral TID  . QUEtiapine  400 mg Oral QHS  . senna  2 tablet Oral Daily  Continuous Infusions:   Principal Problem:   Bone metastases Active Problems:   Sepsis   HCAP (healthcare-associated pneumonia)   Hip pain   Current smoker   Chronic back pain   Lung cancer, middle lobe    Time spent: 15 minutes.    Norabelle Kondo  [facility] Pager [phone]. If 7PM-7AM, please contact night-coverage at [email or web address], password [facility] 07/09/2014, 7:31 AM  LOS: 5 days

## 2014-07-09 NOTE — Progress Notes (Signed)
Attempted to give report to 5N RN. RN unable to take report at this time. Will continue to monitor pt and attempt report again in a few minutes.

## 2014-07-09 NOTE — Progress Notes (Signed)
Subjective: 5 Days Post-Op Procedure(s) (LRB): INTRAMEDULLARY (IM) NAIL INTERTROCHANTRIC (Left) Patient laying comfortably in bed this morning. His only complaint continues to be pain control. He states that it gets tolerable at a 5-6 out of ten and then quickly falls off from there. Otherwise he has no complaints and his vitals appear better at this time.   Activity level:  wbat Diet tolerance:  eaitng well Voiding:  ok Patient reports pain as mild and moderate.    Objective: Vital signs in last 24 hours: Temp:  [97.4 F (36.3 C)-98.4 F (36.9 C)] 97.5 F (36.4 C) (01/31 0809) Pulse Rate:  [84-114] 84 (01/31 0809) Resp:  [16-20] 20 (01/31 0809) BP: (100-121)/(55-71) 108/67 mmHg (01/31 0809) SpO2:  [7 %-100 %] 97 % (01/31 0809)  Labs:  Recent Labs  07/07/14 0345 07/08/14 0410 07/09/14 0245  HGB 10.1* 9.6* 9.3*    Recent Labs  07/08/14 0410 07/09/14 0245  WBC 8.1 9.0  RBC 3.00* 3.01*  HCT 31.0* 31.1*  PLT 124* 142*    Recent Labs  07/08/14 0410 07/09/14 0245  NA 139 139  K 3.4* 5.0  CL 101 101  CO2 34* 30  BUN <5* 9  CREATININE 0.72 0.71  GLUCOSE 91 216*  CALCIUM 6.7* 7.4*    Recent Labs  07/08/14 0410  INR 1.30    Physical Exam:  Neurologically intact ABD soft Neurovascular intact Sensation intact distally Intact pulses distally Dorsiflexion/Plantar flexion intact Incision: dressing C/D/I No cellulitis present Compartment soft  Assessment/Plan:  5 Days Post-Op Procedure(s) (LRB): INTRAMEDULLARY (IM) NAIL INTERTROCHANTRIC (Left) Up with therapy He is continuing to feel better at this time.  He is currently on the medicine service. Pain appears well controlled but he still complains of it. I am cautious to increase his narcotics as i am worried that this may effect his respiratory drive. I would like to get the medicine teams input on pain control to see if they could help change his medicines around safely to help better control his pain  without decreasing his respiratory drive. From an orthopedic standpoint he is good to go home. We will follow up with him in the office 2 weeks post op. We greatly appreciate medical teams management.    Rithika Seel, Larwance Sachs 07/09/2014, 10:09 AM

## 2014-07-10 ENCOUNTER — Telehealth: Payer: Self-pay | Admitting: Medical Oncology

## 2014-07-10 LAB — BASIC METABOLIC PANEL
Anion gap: 6 (ref 5–15)
BUN: 10 mg/dL (ref 6–23)
CO2: 32 mmol/L (ref 19–32)
Calcium: 8.3 mg/dL — ABNORMAL LOW (ref 8.4–10.5)
Chloride: 104 mmol/L (ref 96–112)
Creatinine, Ser: 0.65 mg/dL (ref 0.50–1.35)
GFR calc Af Amer: >90 mL/min (ref >90)
GFR calc non Af Amer: >90 mL/min (ref >90)
Glucose, Bld: 84 mg/dL (ref 70–99)
Potassium: 3.5 mmol/L (ref 3.5–5.1)
Sodium: 142 mmol/L (ref 135–145)

## 2014-07-10 LAB — CBC
HEMATOCRIT: 30.8 % — AB (ref 39.0–52.0)
HEMOGLOBIN: 9.3 g/dL — AB (ref 13.0–17.0)
MCH: 30.7 pg (ref 26.0–34.0)
MCHC: 30.2 g/dL (ref 30.0–36.0)
MCV: 101.7 fL — ABNORMAL HIGH (ref 78.0–100.0)
PLATELETS: 150 10*3/uL (ref 150–400)
RBC: 3.03 MIL/uL — ABNORMAL LOW (ref 4.22–5.81)
RDW: 17.5 % — ABNORMAL HIGH (ref 11.5–15.5)
WBC: 11.2 10*3/uL — ABNORMAL HIGH (ref 4.0–10.5)

## 2014-07-10 MED ORDER — PREDNISONE 20 MG PO TABS
30.0000 mg | ORAL_TABLET | Freq: Every day | ORAL | Status: DC
  Administered 2014-07-11: 30 mg via ORAL
  Filled 2014-07-10 (×2): qty 1

## 2014-07-10 NOTE — Progress Notes (Signed)
Subjective: 6 Days Post-Op Procedure(s) (LRB): INTRAMEDULLARY (IM) NAIL INTERTROCHANTRIC (Left)   Patient laying in bed this morning stating that he had a rough night. He states that he gets behind on his pain medicine during the night and wakes up in terrible pain in his hip and back.  Activity level:  wbat Diet tolerance:  Eating well Voiding:  ok Patient reports pain as moderate.    Objective: Vital signs in last 24 hours: Temp:  [98.1 F (36.7 C)-98.3 F (36.8 C)] 98.2 F (36.8 C) (02/01 0430) Pulse Rate:  [90-97] 90 (02/01 0430) Resp:  [18] 18 (02/01 0430) BP: (108-134)/(59-70) 120/68 mmHg (02/01 0430) SpO2:  [95 %-100 %] 100 % (02/01 0852)  Labs:  Recent Labs  07/08/14 0410 07/09/14 0245 07/10/14 0700  HGB 9.6* 9.3* 9.3*    Recent Labs  07/09/14 0245 07/10/14 0700  WBC 9.0 11.2*  RBC 3.01* 3.03*  HCT 31.1* 30.8*  PLT 142* 150    Recent Labs  07/09/14 0245 07/10/14 0700  NA 139 142  K 5.0 3.5  CL 101 104  CO2 30 32  BUN 9 10  CREATININE 0.71 0.65  GLUCOSE 216* 84  CALCIUM 7.4* 8.3*    Recent Labs  07/08/14 0410  INR 1.30    Physical Exam:  Neurologically intact ABD soft Neurovascular intact Sensation intact distally Intact pulses distally Dorsiflexion/Plantar flexion intact Incision: dressing C/D/I and scant drainage No cellulitis present Compartment soft  Assessment/Plan:  6 Days Post-Op Procedure(s) (LRB): INTRAMEDULLARY (IM) NAIL INTERTROCHANTRIC (Left) Up with therapy He is currently on the medicine service. Patient continues with complaints of pain. I am cautious to increase his narcotics as I am worried that this may effect his respiratory drive. I would like to get the medicine teams input on pain control to see if they could help change his medicines around safely to help better control his pain without decreasing his respiratory drive. From an orthopedic standpoint he is good to be discharged to either home or SNF. Patient  wishes to go home. We will follow up with him in the office 2 weeks post op. We greatly appreciate medical teams management.    [name] [name], [name] [name] 07/10/2014, 9:20 AM

## 2014-07-10 NOTE — Progress Notes (Signed)
Physical Therapy Treatment Patient Details Name: [name] [name] MRN: [id number] DOB: 12-Dec-1965 Today's Date: 07/10/2014    History of Present Illness 49 y/o [name] with lung CA with mets to L hip and with impending L hip fx and so pt is now s/p L IM nailing to prevent fracture. On 07/06/13 pt developed sepsis/PNA .     PT Comments    Pt is overall supervision, but he has extremely limited activity tolerance and I do not see him functioning well at home without 24/7 assist.  His parents provide intermittent supervision only as they are in their 80s and cannot provide physical assist for him.  Even medicated he reports 12/10 pain in bil hips and low back.  He is interested in hospice if he can qualify, but I feel more for pain management than for end of life (so, maybe palliative care consult would be warranted?)  He is adamantly refusing SNF for rehab.  He wants to go home and manage his pain.  If he refuses max HH services recommended.   Follow Up Recommendations  SNF (pt will adamately refuse, so max HH services)     Equipment Recommendations  None recommended by PT    Recommendations for Other Services Other (comment) (hospice/pallitive care?)     Precautions / Restrictions Precautions Precautions: Fall Precaution Comments: h/o falls Restrictions LLE Weight Bearing: Weight bearing as tolerated    Mobility  Bed Mobility Overal bed mobility: Needs Assistance Bed Mobility: Sit to Supine       Sit to supine: Min guard   General bed mobility comments: Min guard assist to lift his left foot 1/4 of the way up into the bed.  Verbal cues for sequencing and positioning.  Per pt he sleeps in his lift chair at home and doesn't sleep in the bed.   Transfers Overall transfer level: Needs assistance Equipment used: Rolling walker (2 wheeled) Transfers: Sit to/from Stand Sit to Stand: Supervision         General transfer comment: supervision for safety from low recliner chair.  Heavy  reliance on upper extremity support during transitions.   Ambulation/Gait Ambulation/Gait assistance: Supervision Ambulation Distance (Feet): 65 Feet Assistive device: Rolling walker (2 wheeled) Gait Pattern/deviations: Step-through pattern;Antalgic;Trunk flexed Gait velocity: decreased Gait velocity interpretation: Below normal speed for age/gender General Gait Details: supervision for safety.  RW adjusted up to fit his height.  Verbal cues for upright posture and to stay closer to RW.  Pt with very limited gait distance.           Balance Overall balance assessment: Needs assistance Sitting-balance support: Feet supported;No upper extremity supported Sitting balance-Leahy Scale: Good     Standing balance support: No upper extremity supported;Bilateral upper extremity supported;Single extremity supported Standing balance-Leahy Scale: Fair                      Cognition Arousal/Alertness: Awake/alert Behavior During Therapy: WFL for tasks assessed/performed Overall Cognitive Status: Within Functional Limits for tasks assessed                      Exercises Total Joint Exercises Ankle Circles/Pumps: AROM;Both;10 reps;Supine Quad Sets: AROM;Left;10 reps;Supine Heel Slides: AAROM;Left;10 reps;Supine Hip ABduction/ADduction: AAROM;Left;10 reps;Supine        Pertinent Vitals/Pain Pain Assessment: 0-10 Pain Score: 10-Worst pain ever (12) Pain Location: both hips, low back Pain Descriptors / Indicators: Aching;Burning;Constant Pain Intervention(s): Limited activity within patient's tolerance;Monitored during session;Repositioned;Premedicated before session  PT Goals (current goals can now be found in the care plan section) Acute Rehab PT Goals Patient Stated Goal: get pain down Progress towards PT goals: Progressing toward goals    Frequency  Min 5X/week    PT Plan Discharge plan needs to be updated       End of Session   Activity  Tolerance: Patient limited by fatigue;Patient limited by pain Patient left: in bed;with call bell/phone within reach     Time: 1731-1749 PT Time Calculation (min) (ACUTE ONLY): 18 min  Charges:  $Gait Training: 8-22 mins                     [name] [name] B. [place], [place], DPT [phone]   07/10/2014, 6:41 PM

## 2014-07-10 NOTE — Progress Notes (Signed)
[place] for zosyn Indication: r/o PNA  Allergies  Allergen Reactions  . Codeine Shortness Of Breath and Nausea And Vomiting       . Morphine Shortness Of Breath and Nausea And Vomiting       . Nicoderm [Nicotine] Other (See Comments)    "Flipped out "    Patient Measurements: Height: 5' 8.5" (174 cm) Weight: 205 lb (92.987 kg) IBW/kg (Calculated) : 69.55 Heparin Dosing Weight: 88 kg  Vital Signs: Temp: 98.2 F (36.8 C) (02/01 0430) Temp Source: Oral (02/01 0430) BP: 120/68 mmHg (02/01 0430) Pulse Rate: 90 (02/01 0430)  Labs:  Recent Labs  07/08/14 0410 07/09/14 0245 07/10/14 0700  HGB 9.6* 9.3* 9.3*  HCT 31.0* 31.1* 30.8*  PLT 124* 142* 150  LABPROT 16.4*  --   --   INR 1.30  --   --   CREATININE 0.72 0.71 0.65    Estimated Creatinine Clearance: 124.8 mL/min (by C-G formula based on Cr of 0.65).   Medical History: Past Medical History  Diagnosis Date  . Depression     bipolar  . Hyperlipidemia   . GERD (gastroesophageal reflux disease)     barrett's  . Bipolar 1 disorder   . Pneumonia   . Impaired fasting glucose 08/06/2013  . Normocytic anemia 08/05/2013  . Drug overdose 08/05/2013  . 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 Hx of radiation therapy 08/18/13- 08/25/13    sacrum/pelvis 2000 cGy in 5 fractions  . Head injury, closed, without LOC   . Cough   . Cancer     bone  . Lung cancer, middle lobe 08/22/2013  . Primary lung cancer with metastasis from lung to other site 08/22/2013    Assessment: Patient is a 49 y.o M with lung and metastatic spine cancer currently undergoing chemotherapy.  He was admitted on 1/26 for repair of hip fracture and was started on aspirin 324mg  BID post-op for VTE prophylaxis.  Patient became hypoxic and tachycardic and was started on empiric antibiotics.  Continues on IV zosyn.  Renal function stable  Goal of Therapy:  Eradication of infection   Plan:  Cont zosyn 3.375 gm IV q8h over 4  hours Pharmacy will sign off.  Please advise if we can be of further assistance  Thanks for allowing pharmacy to be a part of this patient's care.  Excell Seltzer, PharmD Clinical Pharmacist, [phone] 07/10/2014,1:11 PM

## 2014-07-10 NOTE — Progress Notes (Signed)
[facility] PROGRESS NOTE  [name] [name] BDZ:[id number] DOB: [birth year] DOA: 07/04/2014 PCP: [name], MD  Summary [facility]. Hospitalist assumed primary service role on [name] [name] to help manage HCAP/Pain. [name] [name] is a pleasant 49 year old male with a past medical history of lung cancer with metastasis to the left femur, as well as bipolar, history of drug overdose, tobacco dependency, narcotic dependency, as well as GERD and he was admitted on January 26 for preemptive IM nailing of his left femur in order to prevent fracture secondary to metastasis. [name] [name] successfully underwent nailing of his left femur on January 26. He remained in the hospital for monitoring due to intractable pain tachycardia and low-grade fever. Early morning on January 28 he reportedly developed diaphoresis and his oxygen sats dropped into the 70s. Labs were obtained by the primary service and included a d-dimer (1.81) and a WBC (14.5). Chest x-ray showed no acute infiltrate or pulmonary edema.However, CT chest with contrast showed ". Multiple areas of interstitial and alveolar infiltrate consistent with multifocal pneumonia, new from prior study. There is also a new fairly small left pleural effusion". Blood cultures show no growth to date. He is on Zosyn and has remained afebrile with improvement in white count to 9,000 from 14000. Likely, pain meds masked respiratory symptoms. There is possibility of aspiration pneumonitis/postobstructive element. He is no longer wheezing but still requires supplementary oxygen. [name] [name] says pain not controlled. He has most likely developed tolerance to narcotics. He is no longer wheezing, therefore will start to taper prednsione. Continue Mucinex/Incentive spirometer/Albuterol/Atrovent- scheduled and with prn albuterol. Will continue oral Levaquin to complete 3 more days. [name] [name] is agreeable to short-term rehabilitation placement. We'll therefore work with case  management regarding placement. Plan Sepsis/HCAP (healthcare-associated pneumonia)/?Bronchitis  Cultures remain negative  Continue Levaquin oral.  Start Prednisone taper.  Incentive Spirometer/Albuterol/Atrovent/Oxygen supplementation as needed Hip pain/Chronic back pain/Lung cancer, middle lobe/Bone metastases  Adjust Oxycodone dose as possible-discussed with the [name] [name] regarding pain meds. At this point it is probably unsafe to continue escalating narcotics.  Continue senokot.  Defer rest of management to ortho/Oncology. DVT Prophylaxis: On Scds, and lovenox as high risk for DVT, monitor platelets. Code Status: Full code. Family Communication: No family at bedside. Disposition Plan: Snf when bed becomes available.   Consultants:  Orthopedics.  Procedures:  Hip Nailing.  Antibiotics:  Vancomycin 07/06/14> 07/08/14  Zosyn 07/06/14>07/09/14  Levaquin 07/09/14>  HPI/Subjective: Complains of pain  Objective: Filed Vitals:   07/10/14 1516  BP: 121/70  Pulse: 96  Temp: 97.4 F (36.3 C)  Resp: 20    Intake/Output Summary (Last 24 hours) at 07/10/14 1846 Last data filed at 07/10/14 1543  Gross per 24 hour  Intake      0 ml  Output   2525 ml  Net  -2525 ml   Filed Weights   07/04/14 1115  Weight: 92.987 kg (205 lb)    Exam:   General:  Sitting up in chair. Since comfortable.  Cardiovascular: RRR. S1-S2 normal. No murmurs.  Respiratory: Lungs clear.  Abdomen: Soft and nontender.  Musculoskeletal: No pedal edema   Data Reviewed: Basic Metabolic Panel:  Recent Labs Lab 07/06/14 0903 07/08/14 0410 07/09/14 0245 07/10/14 0700  NA 138 139 139 142  K 3.8 3.4* 5.0 3.5  CL 99 101 101 104  CO2 28 34* 30 32  GLUCOSE 119* 91 216* 84  BUN 8 <5* 9 10  CREATININE 0.78 0.72 0.71 0.65  CALCIUM 6.8* 6.7* 7.4* 8.3*  MG  --  1.3* 1.6  --   PHOS  --  2.8 2.9  --    Liver Function Tests:  Recent Labs Lab 07/06/14 0903 07/08/14 0410  AST 16 10   ALT 22 13  ALKPHOS 134* 119*  BILITOT 1.5* 0.9  PROT 6.9 6.2  ALBUMIN 2.8* 2.4*   No results for input(s): LIPASE, AMYLASE in the last 168 hours. No results for input(s): AMMONIA in the last 168 hours. CBC:  Recent Labs Lab 07/06/14 0903 07/07/14 0345 07/08/14 0410 07/09/14 0245 07/10/14 0700  WBC 14.5* 11.1* 8.1 9.0 11.2*  NEUTROABS 12.0*  --   --   --   --   HGB 11.4* 10.1* 9.6* 9.3* 9.3*  HCT 36.5* 33.0* 31.0* 31.1* 30.8*  MCV 102.0* 101.2* 103.3* 103.3* 101.7*  PLT 165 129* 124* 142* 150   Cardiac Enzymes: No results for input(s): CKTOTAL, CKMB, CKMBINDEX, TROPONINI in the last 168 hours. BNP (last 3 results) No results for input(s): BNP in the last 8760 hours.  ProBNP (last 3 results) No results for input(s): PROBNP in the last 8760 hours.  CBG:  Recent Labs Lab 07/06/14 0807  GLUCAP 132*    Recent Results (from the past 240 hour(s))  MRSA PCR Screening     Status: None   Collection Time: 07/06/14 11:20 AM  Result Value Ref Range Status   MRSA by PCR NEGATIVE NEGATIVE Final    Comment:        The GeneXpert MRSA Assay (FDA approved for NASAL specimens only), is one component of a comprehensive MRSA colonization surveillance program. It is not intended to diagnose MRSA infection nor to guide or monitor treatment for MRSA infections.   Culture, blood (routine x 2)     Status: None (Preliminary result)   Collection Time: 07/06/14 11:52 AM  Result Value Ref Range Status   Specimen Description BLOOD RIGHT ARM  Final   Special Requests BOTTLES DRAWN AEROBIC AND ANAEROBIC 10CC  Final   Culture   Final           BLOOD CULTURE RECEIVED NO GROWTH TO DATE CULTURE WILL BE HELD FOR 5 DAYS BEFORE ISSUING A FINAL NEGATIVE REPORT Performed at [facility]    Report Status PENDING  Incomplete  Culture, blood (routine x 2)     Status: None (Preliminary result)   Collection Time: 07/06/14 11:58 AM  Result Value Ref Range Status   Specimen Description  BLOOD LEFT ARM  Final   Special Requests BOTTLES DRAWN AEROBIC AND ANAEROBIC 10CC  Final   Culture   Final           BLOOD CULTURE RECEIVED NO GROWTH TO DATE CULTURE WILL BE HELD FOR 5 DAYS BEFORE ISSUING A FINAL NEGATIVE REPORT Performed at [facility]    Report Status PENDING  Incomplete     Studies: No results found.  Scheduled Meds: . antiseptic oral rinse  7 mL Mouth Rinse BID  . calcium carbonate  1 tablet Oral BID WC  . diazepam  5 mg Oral QHS  . docusate sodium  100 mg Oral BID  . enoxaparin (LOVENOX) injection  40 mg Subcutaneous Q24H  . folic acid  2.5 mg Oral BID  . guaiFENesin  1,200 mg Oral BID  . ipratropium-albuterol  3 mL Nebulization TID  . levofloxacin  750 mg Oral Daily  . OxyCODONE  120 mg Oral Q12H  . pantoprazole  40 mg Oral Daily  . predniSONE  40 mg Oral Q breakfast  .  pregabalin  50 mg Oral TID  . QUEtiapine  25 mg Oral TID  . QUEtiapine  400 mg Oral QHS  . senna  2 tablet Oral Daily   Continuous Infusions:   Principal Problem:   Bone metastases Active Problems:   Sepsis   HCAP (healthcare-associated pneumonia)   Hip pain   Current smoker   Chronic back pain   Lung cancer, middle lobe    Time spent: 15 minutes    [name] [name]  [facility] Pager [phone]. If 7PM-7AM, please contact night-coverage at [email or web address], password [facility] 07/10/2014, 6:46 PM  LOS: 6 days

## 2014-07-10 NOTE — Telephone Encounter (Signed)
[name] [name] is asking if [name] [name] will get treatment tomorrow. I told her [name] [name] will not get chemo tomorrow if he is still in hospital and if he is discharged today [name] [name] will R/S his chemo to another week.i INSTRUCTED HER TO CALL us AFTER HE IS DISCHARGED SO WE CAN R/S HIS APPTS.

## 2014-07-10 NOTE — Progress Notes (Signed)
07/10/14 Contacted patient's mother Roderic Lammert to discuss [place]. She stated that patient lives alone and is not agreeable to staying with her.She is able to provide intermittent supervision but not physical assistance. She chose [facility] for HHC.Will continue to follow for d/c needs, PT to see patient this pm.

## 2014-07-11 ENCOUNTER — Ambulatory Visit: Payer: Self-pay | Admitting: Physician Assistant

## 2014-07-11 ENCOUNTER — Other Ambulatory Visit: Payer: Self-pay

## 2014-07-11 ENCOUNTER — Ambulatory Visit: Payer: Self-pay

## 2014-07-11 ENCOUNTER — Encounter (HOSPITAL_COMMUNITY): Payer: Self-pay | Admitting: General Practice

## 2014-07-11 LAB — BASIC METABOLIC PANEL
ANION GAP: 8 (ref 5–15)
BUN: 8 mg/dL (ref 6–23)
CHLORIDE: 102 mmol/L (ref 96–112)
CO2: 31 mmol/L (ref 19–32)
Calcium: 8.6 mg/dL (ref 8.4–10.5)
Creatinine, Ser: 0.64 mg/dL (ref 0.50–1.35)
GFR calc non Af Amer: >90 mL/min (ref >90)
GLUCOSE: 109 mg/dL — AB (ref 70–99)
POTASSIUM: 3.5 mmol/L (ref 3.5–5.1)
Sodium: 141 mmol/L (ref 135–145)

## 2014-07-11 LAB — CBC
HEMATOCRIT: 32.6 % — AB (ref 39.0–52.0)
Hemoglobin: 9.9 g/dL — ABNORMAL LOW (ref 13.0–17.0)
MCH: 30.8 pg (ref 26.0–34.0)
MCHC: 30.4 g/dL (ref 30.0–36.0)
MCV: 101.6 fL — ABNORMAL HIGH (ref 78.0–100.0)
Platelets: 170 10*3/uL (ref 150–400)
RBC: 3.21 MIL/uL — AB (ref 4.22–5.81)
RDW: 17.2 % — ABNORMAL HIGH (ref 11.5–15.5)
WBC: 11.8 10*3/uL — ABNORMAL HIGH (ref 4.0–10.5)

## 2014-07-11 MED ORDER — POTASSIUM CHLORIDE 20 MEQ/15ML (10%) PO SOLN
40.0000 meq | Freq: Once | ORAL | Status: AC
  Administered 2014-07-11: 40 meq via ORAL
  Filled 2014-07-11: qty 30

## 2014-07-11 MED ORDER — PREDNISONE 20 MG PO TABS
20.0000 mg | ORAL_TABLET | Freq: Every day | ORAL | Status: DC
  Administered 2014-07-12: 20 mg via ORAL
  Filled 2014-07-11 (×2): qty 1

## 2014-07-11 NOTE — Progress Notes (Signed)
Physical Therapy Treatment Patient Details Name: [name] [name] MRN: [id number] DOB: [birth year] Today's Date: 07/11/2014    History of Present Illness 49 y/o Wm with lung CA with mets to L hip and with impending L hip fx and so pt is now s/p L IM nailing to prevent fracture. On 07/06/13 pt developed sepsis/PNA .     PT Comments    Pt continues to be limited by pain, but moves at a supervision level.  He is very self limiting and I am afraid that he will not be moving or able to function at home (ADLs, walking any distance).  He verbalizes that he is just going to go home and stay in his recliner and never move.  O2 sats tested during gait on RA and remained in the low 90s. The one time it dropped to 85% he was quickly able to regain into the 90s with PLB.  PT will continue to follow acutely and pt continues to refuse SNF placement.   Follow Up Recommendations  SNF (pt will refuse, so max HH services)     Equipment Recommendations  None recommended by PT    Recommendations for Other Services Other (comment) (hospice/palliative care?)     Precautions / Restrictions Precautions Precautions: Fall Precaution Comments: h/o falls Restrictions LLE Weight Bearing: Weight bearing as tolerated    Mobility  Bed Mobility Overal bed mobility: Needs Assistance Bed Mobility: Rolling;Sidelying to Sit Rolling: Supervision Sidelying to sit: Supervision;HOB elevated       General bed mobility comments: Supervision for safety.  Significant extra time needed to complete this task on his own. Pt using HOB elevated and railing for support.  I did not take away assitive devices as pt reports he normally sleeps in his lift recliner chair and not in the bed at home.   Transfers Overall transfer level: Needs assistance Equipment used: Rolling walker (2 wheeled) Transfers: Sit to/from Stand Sit to Stand: Supervision         General transfer comment: supervision for safety due to slow speed of  transitions and heavy reliance on hands for support.   Ambulation/Gait Ambulation/Gait assistance: Supervision Ambulation Distance (Feet): 55 Feet Assistive device: Rolling walker (2 wheeled) Gait Pattern/deviations: Step-through pattern;Antalgic;Trunk flexed Gait velocity: decreased Gait velocity interpretation: Below normal speed for age/gender General Gait Details: Supervision for safety, chair to follow to try to encourage increased gait distance and safety, but I think this strategy back-fired as pt wanted to sit down sooner than if I had left the chair in the PT gym.    Stairs Stairs: Yes Stairs assistance: Supervision Stair Management: One rail Right;Step to pattern;Sideways Number of Stairs: 4 General stair comments: Verbal cues for safest LE sequencing, pt reporting severe pain throughout stair training. "Once I am in I am never coming out!" re: getting into his house.          Balance Overall balance assessment: Needs assistance Sitting-balance support: Feet supported;No upper extremity supported Sitting balance-Leahy Scale: Good     Standing balance support: No upper extremity supported;Bilateral upper extremity supported;Single extremity supported Standing balance-Leahy Scale: Fair                      Cognition Arousal/Alertness: Awake/alert Behavior During Therapy: WFL for tasks assessed/performed Overall Cognitive Status: Within Functional Limits for tasks assessed                      Exercises Total Joint Exercises Ankle Circles/Pumps:  AROM;Both;10 reps;Supine Quad Sets: AROM;Left;10 reps;Supine Short Arc Quad: AROM;Left;10 reps;Supine Heel Slides: AAROM;Left;10 reps;Supine Hip ABduction/ADduction: AAROM;Left;10 reps;Supine    General Comments General comments (skin integrity, edema, etc.): Tested O2 sats with gait on RA and pt was able to maintain low 90s.  He dropped once in sitting and was able to bring it back up to >90% with pursed  lip breathing.       Pertinent Vitals/Pain Pain Assessment: 0-10 Pain Score: 10-Worst pain ever Pain Location: both hips, right knee, back Pain Descriptors / Indicators: Aching;Burning Pain Intervention(s): Limited activity within patient's tolerance;Monitored during session;Repositioned;Premedicated before session           PT Goals (current goals can now be found in the care plan section) Acute Rehab PT Goals Patient Stated Goal: get pain down Progress towards PT goals: Progressing toward goals    Frequency  Min 5X/week    PT Plan Current plan remains appropriate       End of Session   Activity Tolerance: Patient limited by fatigue;Patient limited by pain Patient left: in chair;with call bell/phone within reach     Time: 6381-7711 PT Time Calculation (min) (ACUTE ONLY): 31 min  Charges:  $Gait Training: 8-22 mins $Therapeutic Exercise: 8-22 mins           [name] [name] B. [name] [name], PT, DPT [phone]   07/11/2014, 12:40 PM

## 2014-07-11 NOTE — Clinical Social Work Note (Signed)
Patient is refusing SNF placement.  CSW signing off, but will remain available should assistance be needed.  Patient disposition: discharged home with mother  RNCM has been consulted and aware.  [name], [place] [phone]  Psychiatric & Orthopedics (5N 1-16) Clinical Social Worker

## 2014-07-11 NOTE — Progress Notes (Signed)
07/11/14 Spoke with patient and his mother and explained SNF and HHC options.Explained that PT is recommending SNF but decision is up to  Mr Tallman. They stated that they are going to discuss their options for d/c. Will follow up with patient on 07/12/14.    07/10/14 Contacted patient's mother Reynold Mantell to discuss [place]. She stated that patient lives alone and is not agreeable to staying with her.She is able to provide intermittent supervision but not physical assistance. She chose [facility] for HHC.Will continue to follow for d/c needs, PT to see patient this pm.  07/06/14 Spoke with patient on 07/05/14 about HHC. He stated that he is not familiar with any agencies and would like to have his mom decide on the [place] agency. Left a [place] for his mom. Patient stated that he has a rolling walker, 3N1, hospital bed and recliner chair at home. He will have his mom helping him after d/c. Will continue to follow.

## 2014-07-11 NOTE — Progress Notes (Signed)
PT Cancellation Note  Patient Details Name: [name] [name] MRN: [id number] DOB: [birth year]   Cancelled Treatment:    Reason Eval/Treat Not Completed: Pain limiting ability to participate.  Pt would like PT to check back later this AM.  I will re-attempt before lunch.  Thanks,    Barbarann Ehlers. Janiene Aarons, PT, DPT [phone]   07/11/2014, 9:13 AM

## 2014-07-11 NOTE — Progress Notes (Signed)
[facility] PROGRESS NOTE  [name] [name] JIR:[id number] DOB: [birth year] DOA: 07/04/2014 PCP: [name], MD  Summary [facility]. Hospitalist service assumed primary attending role on [name] [name] to help manage HCAP/Pain. [name] [name] is a pleasant 49 year old male with a past medical history of lung cancer with metastasis to the left femur, as well as bipolar, history of drug overdose, tobacco dependency, narcotic dependency, as well as GERD and he was admitted on January 26 for preemptive IM nailing of his left femur in order to prevent fracture secondary to metastasis. [name]. [name] [name] successfully underwent nailing of his left femur on January 26. He remained in the hospital for monitoring due to intractable pain tachycardia and low-grade fever. Early morning on January 28 he reportedly developed diaphoresis and his oxygen sats dropped into the 70s. Labs were obtained by the primary service and included a d-dimer (1.81) and a WBC (14.5). Chest x-ray showed no acute infiltrate or pulmonary edema.However, CT chest with contrast showed ". Multiple areas of interstitial and alveolar infiltrate consistent with multifocal pneumonia, new from prior study. There is also a new fairly small left pleural effusion". Blood cultures show no growth to date. He was initially placed on vancomycin/Zosyn and was then switched to Levaquin. Likely, pain meds masked respiratory symptoms. There is possibility of aspiration pneumonitis. He is no longer wheezing but still requires supplementary oxygen. Patient says pain not controlled. He has most likely developed tolerance to narcotics. Will continue steroid taper. Also continue Mucinex/Incentive spirometer/Albuterol/Atrovent- scheduled and with prn albuterol. Will continue oral Levaquin to complete 2 more days. His white count is 11,800 today. If the white count keeps trending up, would be concerned for partially treated infection. There is no suggestion for C.  difficile colitis at the moment. Regarding disposition, patient was initially agreeable to short-term rehabilitation placement  but he has now changed his mind and would like to go home. However, his mother who is his primary caregiver and lives across the street from the patient says that she will not be able to provide him the around the clock supervision he needs to get around the house. She would like to speak with case management regarding the options while patient rate conceived hours. He admits today that he is not able to get around without assistance. He easily gets dyspneic with minimal exertion. I have consulted case management to confer with patient and his mother again before they finalize on a decision. Plan Sepsis/HCAP (healthcare-associated pneumonia)/?Bronchitis  Cultures remain negative  Continue Levaquin oral.  Continue Prednisone taper.  Incentive Spirometer/Albuterol/Atrovent/Oxygen supplementation as needed Hip pain/Chronic back pain/Lung cancer, middle lobe/Bone metastases  Adjust Oxycodone dose as possible-discussed with the patient regarding pain meds. At this point it is probably unsafe to continue escalating narcotics.  Continue senokot.  Defer rest of management to ortho/Oncology. DVT Prophylaxis: On Scds, and lovenox as high risk for DVT, monitor platelets. Code Status: Full code. Family Communication: Spoke with patient's mother at bedside. Disposition Plan: ? Home with home health services since patient refusing short-term rehabilitation placement.   Consultants:  Orthopedics.  Procedures:  Hip Nailing.  Antibiotics:  Vancomycin 07/06/14> 07/08/14  Zosyn 07/06/14>07/09/14  Levaquin 07/09/14>  HPI/Subjective: Says he feels sweaty. No diarrhea.  Objective: Filed Vitals:   07/11/14 1300  BP: 109/67  Pulse: 96  Temp: 98.3 F (36.8 C)  Resp: 18    Intake/Output Summary (Last 24 hours) at 07/11/14 1546 Last data filed at 07/11/14 1300  Gross  per 24 hour  Intake  120 ml  Output   2150 ml  Net  -2030 ml   Filed Weights   07/04/14 1115  Weight: 92.987 kg (205 lb)    Exam:   General:  Lying comfortably in bed.  Cardiovascular: RRR. S1-S2 normal. No murmurs.  Respiratory: Good air entry bilaterally. Scant rhonchi.  Abdomen: Soft and nontender. Normal bowel sounds.  Musculoskeletal: No pedal edema.  Data Reviewed: Basic Metabolic Panel:  Recent Labs Lab 07/06/14 0903 07/08/14 0410 07/09/14 0245 07/10/14 0700 07/11/14 0600  NA 138 139 139 142 141  K 3.8 3.4* 5.0 3.5 3.5  CL 99 101 101 104 102  CO2 28 34* 30 32 31  GLUCOSE 119* 91 216* 84 109*  BUN 8 <5* 9 10 8   CREATININE 0.78 0.72 0.71 0.65 0.64  CALCIUM 6.8* 6.7* 7.4* 8.3* 8.6  MG  --  1.3* 1.6  --   --   PHOS  --  2.8 2.9  --   --    Liver Function Tests:  Recent Labs Lab 07/06/14 0903 07/08/14 0410  AST 16 10  ALT 22 13  ALKPHOS 134* 119*  BILITOT 1.5* 0.9  PROT 6.9 6.2  ALBUMIN 2.8* 2.4*   No results for input(s): LIPASE, AMYLASE in the last 168 hours. No results for input(s): AMMONIA in the last 168 hours. CBC:  Recent Labs Lab 07/06/14 0903 07/07/14 0345 07/08/14 0410 07/09/14 0245 07/10/14 0700 07/11/14 0600  WBC 14.5* 11.1* 8.1 9.0 11.2* 11.8*  NEUTROABS 12.0*  --   --   --   --   --   HGB 11.4* 10.1* 9.6* 9.3* 9.3* 9.9*  HCT 36.5* 33.0* 31.0* 31.1* 30.8* 32.6*  MCV 102.0* 101.2* 103.3* 103.3* 101.7* 101.6*  PLT 165 129* 124* 142* 150 170   Cardiac Enzymes: No results for input(s): CKTOTAL, CKMB, CKMBINDEX, TROPONINI in the last 168 hours. BNP (last 3 results) No results for input(s): BNP in the last 8760 hours.  ProBNP (last 3 results) No results for input(s): PROBNP in the last 8760 hours.  CBG:  Recent Labs Lab 07/06/14 0807  GLUCAP 132*    Recent Results (from the past 240 hour(s))  MRSA PCR Screening     Status: None   Collection Time: 07/06/14 11:20 AM  Result Value Ref Range Status   MRSA by PCR  NEGATIVE NEGATIVE Final    Comment:        The GeneXpert MRSA Assay (FDA approved for NASAL specimens only), is one component of a comprehensive MRSA colonization surveillance program. It is not intended to diagnose MRSA infection nor to guide or monitor treatment for MRSA infections.   Culture, blood (routine x 2)     Status: None (Preliminary result)   Collection Time: 07/06/14 11:52 AM  Result Value Ref Range Status   Specimen Description BLOOD RIGHT ARM  Final   Special Requests BOTTLES DRAWN AEROBIC AND ANAEROBIC 10CC  Final   Culture   Final           BLOOD CULTURE RECEIVED NO GROWTH TO DATE CULTURE WILL BE HELD FOR 5 DAYS BEFORE ISSUING A FINAL NEGATIVE REPORT Performed at [facility]    Report Status PENDING  Incomplete  Culture, blood (routine x 2)     Status: None (Preliminary result)   Collection Time: 07/06/14 11:58 AM  Result Value Ref Range Status   Specimen Description BLOOD LEFT ARM  Final   Special Requests BOTTLES DRAWN AEROBIC AND ANAEROBIC 10CC  Final   Culture  Final           BLOOD CULTURE RECEIVED NO GROWTH TO DATE CULTURE WILL BE HELD FOR 5 DAYS BEFORE ISSUING A FINAL NEGATIVE REPORT Performed at [facility]    Report Status PENDING  Incomplete     Studies: No results found.  Scheduled Meds: . antiseptic oral rinse  7 mL Mouth Rinse BID  . calcium carbonate  1 tablet Oral BID WC  . diazepam  5 mg Oral QHS  . docusate sodium  100 mg Oral BID  . enoxaparin (LOVENOX) injection  40 mg Subcutaneous Q24H  . folic acid  2.5 mg Oral BID  . guaiFENesin  1,200 mg Oral BID  . ipratropium-albuterol  3 mL Nebulization TID  . levofloxacin  750 mg Oral Daily  . OxyCODONE  120 mg Oral Q12H  . pantoprazole  40 mg Oral Daily  . [START ON 07/12/2014] predniSONE  20 mg Oral Q breakfast  . pregabalin  50 mg Oral TID  . QUEtiapine  25 mg Oral TID  . QUEtiapine  400 mg Oral QHS  . senna  2 tablet Oral Daily   Continuous Infusions:   Principal  Problem:   Bone metastases Active Problems:   Sepsis   HCAP (healthcare-associated pneumonia)   Hip pain   Current smoker   Chronic back pain   Lung cancer, middle lobe    Time spent: 25 minutes.    [name] [name]  [facility] Pager [phone]. If 7PM-7AM, please contact night-coverage at [email or web address], password [facility] 07/11/2014, 3:46 PM  LOS: 7 days

## 2014-07-11 NOTE — Progress Notes (Signed)
Subjective: 7 Days Post-Op Procedure(s) (LRB): INTRAMEDULLARY (IM) NAIL INTERTROCHANTRIC (Left)  Patient laying in bed appears to be resting comfortably. He communicates clearly but does not keep his eyes open very long if at all. He states that he is in a lot of pain in his hip and back.  He has no other complaints.  Activity level:  wbat Diet tolerance:  ok Voiding:  ok Patient reports pain as moderate and severe.    Objective: Vital signs in last 24 hours: Temp:  [97.4 F (36.3 C)-98.3 F (36.8 C)] 97.9 F (36.6 C) (02/02 0505) Pulse Rate:  [96-98] 97 (02/02 0505) Resp:  [18-20] 18 (02/02 0505) BP: (110-134)/(67-70) 110/67 mmHg (02/02 0505) SpO2:  [97 %-100 %] 97 % (02/02 0927)  Labs:  Recent Labs  07/09/14 0245 07/10/14 0700 07/11/14 0600  HGB 9.3* 9.3* 9.9*    Recent Labs  07/10/14 0700 07/11/14 0600  WBC 11.2* 11.8*  RBC 3.03* 3.21*  HCT 30.8* 32.6*  PLT 150 170    Recent Labs  07/10/14 0700 07/11/14 0600  NA 142 141  K 3.5 3.5  CL 104 102  CO2 32 31  BUN 10 8  CREATININE 0.65 0.64  GLUCOSE 84 109*  CALCIUM 8.3* 8.6   No results for input(s): LABPT, INR in the last 72 hours.  Physical Exam:  Neurologically intact ABD soft Neurovascular intact Sensation intact distally Intact pulses distally Dorsiflexion/Plantar flexion intact Incision: dressing C/D/I No cellulitis present Compartment soft  Assessment/Plan:  7 Days Post-Op Procedure(s) (LRB): INTRAMEDULLARY (IM) NAIL INTERTROCHANTRIC (Left) Up with therapy  He is currently on the medicine service. Patient continues with complaints of pain. I am not sure what other options for pain medication there are. Maybe a consult for palliative care or hospice for pain management would be an option but I will defer this to the medical team.   From an orthopedic standpoint he is good to be discharged to either home or SNF. Patient wishes to go home but PT recommends a SNF. We will follow up with him  in the office 2 weeks post op. We greatly appreciate medical teams management.   Darly Massi, Larwance Sachs 07/11/2014, 10:16 AM

## 2014-07-12 DIAGNOSIS — Z720 Tobacco use: Secondary | ICD-10-CM

## 2014-07-12 DIAGNOSIS — G8929 Other chronic pain: Secondary | ICD-10-CM

## 2014-07-12 DIAGNOSIS — M549 Dorsalgia, unspecified: Secondary | ICD-10-CM

## 2014-07-12 LAB — BASIC METABOLIC PANEL
Anion gap: 8 (ref 5–15)
BUN: 9 mg/dL (ref 6–23)
CALCIUM: 9.1 mg/dL (ref 8.4–10.5)
CO2: 32 mmol/L (ref 19–32)
CREATININE: 0.76 mg/dL (ref 0.50–1.35)
Chloride: 101 mmol/L (ref 96–112)
GFR calc Af Amer: >90 mL/min (ref >90)
GFR calc non Af Amer: >90 mL/min (ref >90)
GLUCOSE: 102 mg/dL — AB (ref 70–99)
Potassium: 3.9 mmol/L (ref 3.5–5.1)
SODIUM: 141 mmol/L (ref 135–145)

## 2014-07-12 LAB — CBC
HCT: 35.4 % — ABNORMAL LOW (ref 39.0–52.0)
Hemoglobin: 10.8 g/dL — ABNORMAL LOW (ref 13.0–17.0)
MCH: 30.9 pg (ref 26.0–34.0)
MCHC: 30.5 g/dL (ref 30.0–36.0)
MCV: 101.1 fL — AB (ref 78.0–100.0)
PLATELETS: 174 10*3/uL (ref 150–400)
RBC: 3.5 MIL/uL — ABNORMAL LOW (ref 4.22–5.81)
RDW: 17.5 % — AB (ref 11.5–15.5)
WBC: 12.8 10*3/uL — AB (ref 4.0–10.5)

## 2014-07-12 LAB — CULTURE, BLOOD (ROUTINE X 2)
CULTURE: NO GROWTH
Culture: NO GROWTH

## 2014-07-12 MED ORDER — SENNA-DOCUSATE SODIUM 8.6-50 MG PO TABS: 4.0000 | ORAL_TABLET | Freq: Every day | ORAL | Status: AC

## 2014-07-12 MED ORDER — IPRATROPIUM-ALBUTEROL 0.5-2.5 (3) MG/3ML IN SOLN
3.0000 mL | Freq: Four times a day (QID) | RESPIRATORY_TRACT | Status: DC | PRN
Start: 2014-07-12 — End: 2014-07-12

## 2014-07-12 MED ORDER — PREDNISONE 10 MG PO TABS
10.0000 mg | ORAL_TABLET | Freq: Every day | ORAL | Status: DC
  Filled 2014-07-12: qty 1

## 2014-07-12 MED ORDER — OXYCODONE HCL 10 MG PO TABS: 10.0000 mg | ORAL_TABLET | ORAL | Status: DC | PRN

## 2014-07-12 MED ORDER — BISACODYL 5 MG PO TBEC: 10.0000 mg | DELAYED_RELEASE_TABLET | Freq: Every day | ORAL | Status: DC | PRN

## 2014-07-12 MED ORDER — PREDNISONE 10 MG PO TABS: 10.0000 mg | ORAL_TABLET | Freq: Every day | ORAL | Status: AC

## 2014-07-12 MED ORDER — METHOCARBAMOL 500 MG PO TABS: 500.0000 mg | ORAL_TABLET | Freq: Four times a day (QID) | ORAL | Status: DC | PRN

## 2014-07-12 MED ORDER — GUAIFENESIN ER 600 MG PO TB12
1200.0000 mg | ORAL_TABLET | Freq: Two times a day (BID) | ORAL | Status: DC
Start: 2014-07-12 — End: 2014-08-01

## 2014-07-12 MED ORDER — DIAZEPAM 5 MG PO TABS: 5.0000 mg | ORAL_TABLET | Freq: Every day | ORAL | Status: AC

## 2014-07-12 MED ORDER — OXYCODONE HCL ER 60 MG PO T12A: EXTENDED_RELEASE_TABLET | ORAL | Status: DC

## 2014-07-12 NOTE — Progress Notes (Signed)
Subjective: 8 Days Post-Op Procedure(s) (LRB): INTRAMEDULLARY (IM) NAIL INTERTROCHANTRIC (Left)  Patient laying in bed comfortably. Still continues with pain in hip and spine. He is agreeable to go to SNF today.  Activity level:  WBAT Diet tolerance:  ok Voiding:  ok Patient reports pain as moderate.    Objective: Vital signs in last 24 hours: Temp:  [97.7 F (36.5 C)-98.3 F (36.8 C)] 97.9 F (36.6 C) (02/03 0521) Pulse Rate:  [96-109] 109 (02/03 0521) Resp:  [18] 18 (02/03 0521) BP: (107-146)/(61-72) 107/61 mmHg (02/03 0521) SpO2:  [93 %-98 %] 98 % (02/03 0810)  Labs:  Recent Labs  07/10/14 0700 07/11/14 0600 07/12/14 0543  HGB 9.3* 9.9* 10.8*    Recent Labs  07/11/14 0600 07/12/14 0543  WBC 11.8* 12.8*  RBC 3.21* 3.50*  HCT 32.6* 35.4*  PLT 170 174    Recent Labs  07/11/14 0600 07/12/14 0543  NA 141 141  K 3.5 3.9  CL 102 101  CO2 31 32  BUN 8 9  CREATININE 0.64 0.76  GLUCOSE 109* 102*  CALCIUM 8.6 9.1   No results for input(s): LABPT, INR in the last 72 hours.  Physical Exam:  Neurologically intact ABD soft Neurovascular intact Sensation intact distally Intact pulses distally Dorsiflexion/Plantar flexion intact Incision: dressing C/D/I No cellulitis present Compartment soft  Assessment/Plan:  8 Days Post-Op Procedure(s) (LRB): INTRAMEDULLARY (IM) NAIL INTERTROCHANTRIC (Left) Up with therapy He is currently on the medicine service. Patient continues with complaints of pain. He states that his pain is good then he falls behind on it.  He is agreeable to go to SNF today. We will follow up with him in the office 2 weeks post op. We greatly appreciate medical teams management.    Torrey Ballinas, Larwance Sachs 07/12/2014, 8:11 AM

## 2014-07-12 NOTE — Clinical Social Work Psychosocial (Signed)
Clinical Social Work Department BRIEF PSYCHOSOCIAL ASSESSMENT 07/12/2014  Patient:  [name] [name], [name] [name]     Account Number:  [id number]     Admit date:  07/04/2014  Clinical Social Worker:  Wylene Men  Date/Time:  07/12/2014 10:41 AM  Referred by:  Physician  Date Referred:  07/12/2014 Referred for  SNF Placement  Psychosocial assessment   Other Referral:   none   Interview type:  Other - See comment Other interview type:   patient and patient's mother    PSYCHOSOCIAL DATA Living Status:  PARENTS Admitted from facility:   Level of care:   Primary support name:  [name] Primary support relationship to patient:  PARENT Degree of support available:   strong    CURRENT CONCERNS Current Concerns  Post-Acute Placement   Other Concerns:   none    SOCIAL WORK ASSESSMENT / PLAN CSW assessed patient at bedside. Patient was alert and oriented at the time of this assessment. CSW reports living at home with mother, Judson Roch prior to this hospital admission.  PT is recommending SNF at time of discharge. Patient has been reluctant to these services, but is now agreeable for CSW to conduct SNF search and obtain a facility for time of discharge.  Patient wishes for CSW to speak to mother regarding placement options.  Patient is hopeful to return home though is realistic regarding his prognosis (lung cancer, mets, pneumonia, etc...).   Assessment/plan status:  Psychosocial Support/Ongoing Assessment of Needs Other assessment/ plan:   FL2  PASARR   Information/referral to community resources:   SNF/STR    PATIENT'S/FAMILY'S RESPONSE TO PLAN OF CARE: Patient though previously refusing is now agreeable to SNF placement and prefers [facility].       [name], [place] [phone]  Psychiatric & Orthopedics (5N 1-16) Clinical Social Worker

## 2014-07-12 NOTE — Clinical Social Work Placement (Addendum)
Clinical Social Work Department CLINICAL SOCIAL WORK PLACEMENT NOTE 07/12/2014  Patient:  CAMAR, GUYTON  Account Number:  [id number] Admit date:  07/04/2014  Clinical Social Worker:  Wylene Men  Date/time:  07/12/2014 10:45 AM  Clinical Social Work is seeking post-discharge placement for this patient at the following level of care:   SKILLED NURSING   (*CSW will update this form in Epic as items are completed)   07/12/2014  Patient/family provided with [facility] Department of Clinical Social Work's list of facilities offering this level of care within the geographic area requested by the patient (or if unable, by the patient's family).  07/12/2014  Patient/family informed of their freedom to choose among providers that offer the needed level of care, that participate in Medicare, Medicaid or managed care program needed by the patient, have an available bed and are willing to accept the patient.  07/12/2014  Patient/family informed of [facility]' ownership interest in [facility], as well as of the fact that they are under no obligation to receive care at this facility.  PASARR submitted to EDS on 07/12/2014 PASARR number received on 07/12/2014  FL2 transmitted to all facilities in geographic area requested by pt/family on  07/12/2014 FL2 transmitted to all facilities within larger geographic area on 07/12/2014  Patient informed that his/her managed care company has contracts with or will negotiate with  certain facilities, including the following:     Patient/family informed of bed offers received:  07/12/2014 Patient chooses bed at [facility] Physician recommends and patient chooses bed at  [facility]  Patient to be transferred to  on  07/12/2014 Patient to be transferred to facility by [facility] Patient and family notified of transfer on 07/12/2014 Name of family member notified:  mother, Judson Roch and Lynann Bologna  The following physician request were entered in  Epic:   Additional Comments:   [name], [place] [phone]  Psychiatric & Orthopedics (5N 1-16) Clinical Social Worker
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
## 2014-07-12 NOTE — Progress Notes (Signed)
CARE MANAGEMENT NOTE 07/12/2014  Patient:  [name] [name], [name] [name]   Account Number:  [id number]  Date Initiated:  07/06/2014  Documentation initiated by:  [facility]  Subjective/Objective Assessment:   left femur fracture, s/p IM nail  has metastatic lung CA     Action/Plan:   PT/OT evals- recommended SNF   Anticipated DC Date:  07/12/2014   Anticipated DC Plan:  SKILLED NURSING FACILITY  In-house referral  Clinical Social Worker      DC Planning Services  CM consult      Choice offered to / List presented to:             Status of service:  Completed, signed off Medicare Important Message given?  YES Date Medicare IM given:  07/07/2014 Medicare IM given by:  Elissa Hefty Date Additional Medicare IM given:  07/12/2014 Additional Medicare IM given by:  [place] Plan  Discharge Disposition:  [place]  Per UR Regulation:  Reviewed for med. necessity/level of care/duration of stay  If discussed at [place] of Stay Meetings, dates discussed:   07/11/2014    Comments:  07/12/14 Patient has decided to go to SNF. Referral made to CSW. Will continue to follow until discharge.  07/11/14 Spoke with patient and his mother and explained SNF and HHC options.Explained that PT is recommending SNF but decision is up to to [name] [name]. They stated that they are going to discuss their options for d/c. Will follow up with patient on 07/12/14.   07/10/14 Contacted patient's mother [name] [name] to discuss [place]. She stated that patient lives alone and is not agreeable to staying with her.She is able to provide intermittent supervision but not physical assistance. She chose [facility] for HHC.Will continue to follow for d/c needs, PT to see patient this pm.  07/06/14 Spoke with patient on 07/05/14 about HHC. He stated that he is not familiar with any agencies and would like to have his mom decide on the [facility] agency. Left a [place] for his mom. Patient stated that he has a  rolling walker, 3N1, hospital bed and recliner chair at home. He will have his mom helping him after d/c. Will continue to follow.

## 2014-07-12 NOTE — Discharge Planning (Signed)
Patient to discharge today per MD. Patient to discharge to [facility] RN to call report prior to transportation to: [phone] Transportation: [facility]- scheduled for 3:00 (to accommodate medication schedule)   CSW spoke with patient and mother, [name] [name] to review discharge plans.  All were agreeable.  RN is agreeable to 3pm schedule for transportation (time accomodates medication schedule).  [name], [name] [phone]  Psychiatric & Orthopedics (5N 1-16) Clinical Social Worker

## 2014-07-13 ENCOUNTER — Other Ambulatory Visit: Payer: Self-pay | Admitting: *Deleted

## 2014-07-13 ENCOUNTER — Other Ambulatory Visit: Payer: Self-pay | Admitting: Medical Oncology

## 2014-07-13 DIAGNOSIS — C7949 Secondary malignant neoplasm of other parts of nervous system: Secondary | ICD-10-CM

## 2014-07-13 DIAGNOSIS — C3490 Malignant neoplasm of unspecified part of unspecified bronchus or lung: Secondary | ICD-10-CM

## 2014-07-13 DIAGNOSIS — Z23 Encounter for immunization: Secondary | ICD-10-CM

## 2014-07-13 MED ORDER — OXYCODONE HCL 10 MG PO TABS: 10.0000 mg | ORAL_TABLET | ORAL | Status: DC | PRN

## 2014-07-13 NOTE — Telephone Encounter (Signed)
PT. WAS DISCHARGED TO A REHAB FACILITY. PT. WOULD NOT STAY AT THE FACILITY AND IS AT HOME. ON 07/12/14 AT DISCHARGE PT. WAS GIVEN A PRESCRIPTION FOR OXYCODONE 10MG  #10. HE ALSO HAS EIGHT TABLETS AT HOME SO HIS MOTHER WOULD LIKE TO PICK UP ANOTHER PRESCRIPTION TODAY.

## 2014-07-13 NOTE — Telephone Encounter (Signed)
Refill locked up. Mother asking when pt needs to see [facility]. Note to mohamed.

## 2014-07-14 ENCOUNTER — Telehealth: Payer: Self-pay | Admitting: Medical Oncology

## 2014-07-14 ENCOUNTER — Telehealth: Payer: Self-pay | Admitting: Internal Medicine

## 2014-07-14 ENCOUNTER — Telehealth: Payer: Self-pay

## 2014-07-14 DIAGNOSIS — C7931 Secondary malignant neoplasm of brain: Secondary | ICD-10-CM

## 2014-07-14 DIAGNOSIS — C7949 Secondary malignant neoplasm of other parts of nervous system: Secondary | ICD-10-CM

## 2014-07-14 DIAGNOSIS — C342 Malignant neoplasm of middle lobe, bronchus or lung: Secondary | ICD-10-CM

## 2014-07-14 NOTE — Telephone Encounter (Signed)
Notified Angie with md response. Faxing order for oxygen to [facility]...Johny Chess

## 2014-07-14 NOTE — Telephone Encounter (Signed)
Both OK Dx: Metastatic lung cancer with hypoxemia Adult Failure to Thrive

## 2014-07-14 NOTE — Telephone Encounter (Signed)
Phone call from [facility] with [place] [phone] stating she received the oxygen order but needs documentation that patient's oxygen sat is 88% or below on room air at rest or ambulating

## 2014-07-14 NOTE — Telephone Encounter (Signed)
See hospital D/C Summary O2 sats into 70s

## 2014-07-14 NOTE — Telephone Encounter (Signed)
Jeanmarie Hubert with [facility] calling for [name] [name].  Oxygen ordered when he was discharged from hospital.  Has not received yet.  Room air, 89%.  Needs to be sent to his home.  Also requesting order for OT 2-3 times/week.  Please call her back at [phone]

## 2014-07-14 NOTE — Telephone Encounter (Signed)
-----   Message from Curt Bears, MD sent at 07/14/2014  8:30 AM EST ----- 2-3 weeks after recovery from his surgery. He needs to call his ortho doctor for follow up. ----- Message -----    From: Ardeen Garland, RN    Sent: 07/13/2014   2:23 PM      To: Curt Bears, MD  He is discharged and left  ama from rehab the day he was supposed to be admitted He is at home under his mothers care, walking with a walker. He does not have f/u with [facility].  When do you want to see him?

## 2014-07-17 ENCOUNTER — Telehealth: Payer: Self-pay | Admitting: Internal Medicine

## 2014-07-17 NOTE — Telephone Encounter (Signed)
Confirm appointment for 03/03

## 2014-07-17 NOTE — Telephone Encounter (Signed)
-----   Message from Curt Bears, MD sent at 07/14/2014  8:30 AM EST ----- 2-3 weeks after recovery from his surgery. He needs to call his ortho doctor for follow up. ----- Message -----    From: Ardeen Garland, RN    Sent: 07/13/2014   2:23 PM      To: Curt Bears, MD  He is discharged and left  ama from rehab the day he was supposed to be admitted He is at home under his mothers care, walking with a walker. He does not have f/u with [facility].  When do you want to see him?

## 2014-07-17 NOTE — Telephone Encounter (Signed)
This information has been faxed to [facility] at [phone]

## 2014-07-17 NOTE — Telephone Encounter (Signed)
Called and left a message with Judson Roch that we will schedule an appt in 2-3 weeks.  Also advised that they f/u with ortho MD.  Onc tx schedule filled out.

## 2014-07-19 ENCOUNTER — Other Ambulatory Visit: Payer: Self-pay | Admitting: Internal Medicine

## 2014-07-19 ENCOUNTER — Other Ambulatory Visit: Payer: Self-pay

## 2014-07-19 DIAGNOSIS — C7951 Secondary malignant neoplasm of bone: Secondary | ICD-10-CM

## 2014-07-19 MED ORDER — IBUPROFEN 600 MG PO TABS: 600.0000 mg | ORAL_TABLET | Freq: Four times a day (QID) | ORAL | Status: AC | PRN

## 2014-07-20 ENCOUNTER — Other Ambulatory Visit: Payer: Self-pay | Admitting: *Deleted

## 2014-07-20 DIAGNOSIS — C7949 Secondary malignant neoplasm of other parts of nervous system: Secondary | ICD-10-CM

## 2014-07-20 DIAGNOSIS — Z23 Encounter for immunization: Secondary | ICD-10-CM

## 2014-07-20 DIAGNOSIS — C3490 Malignant neoplasm of unspecified part of unspecified bronchus or lung: Secondary | ICD-10-CM

## 2014-07-20 MED ORDER — OXYCODONE HCL 10 MG PO TABS: 10.0000 mg | ORAL_TABLET | ORAL | Status: DC | PRN

## 2014-07-24 ENCOUNTER — Telehealth: Payer: Self-pay

## 2014-07-24 NOTE — Telephone Encounter (Signed)
Phone call from [facility] with [place]. Regarding patient's oxygen that was ordered. He did not receive upon leaving the hospital. I left her know I faxed over the hospital discharge summary. Melissa states this was not received. She asked how I found that in Epic and I walked her through this. She sates she will call back to confirm for sure but since patient has Medicare he may need an office visit so specific documentation can be made regarding his oxygen sat levels.

## 2014-07-25 ENCOUNTER — Telehealth: Payer: Self-pay

## 2014-07-25 NOTE — Telephone Encounter (Signed)
Phone call to patient and left a message to call back so he can schedule an office visit, per Melissa at [facility].

## 2014-07-25 NOTE — Telephone Encounter (Signed)
This is unfortunate;OV necessary for O2 check

## 2014-07-25 NOTE — Telephone Encounter (Signed)
-----   Message from Darlina Guys sent at 07/25/2014  2:53 PM EST ----- Hello [name].  We reviewed the sat documentation that you were so kind to help me find in the hospital d/c summary yesterday.  Unfortunately, it will not comply with Medicare guidelines. Would you be able to bring the patient in for an office visit to document the pt's dx, symptoms and need for O2 as well as O2 sat documentation?  Let me know if you need any help with the sat and order templates in epic.  Thanks! Melissa [phone]

## 2014-07-26 ENCOUNTER — Other Ambulatory Visit: Payer: Self-pay | Admitting: *Deleted

## 2014-07-26 ENCOUNTER — Telehealth: Payer: Self-pay | Admitting: *Deleted

## 2014-07-26 DIAGNOSIS — C342 Malignant neoplasm of middle lobe, bronchus or lung: Secondary | ICD-10-CM

## 2014-07-26 DIAGNOSIS — C7951 Secondary malignant neoplasm of bone: Secondary | ICD-10-CM

## 2014-07-26 DIAGNOSIS — Z23 Encounter for immunization: Secondary | ICD-10-CM

## 2014-07-26 DIAGNOSIS — C7949 Secondary malignant neoplasm of other parts of nervous system: Secondary | ICD-10-CM

## 2014-07-26 DIAGNOSIS — C3490 Malignant neoplasm of unspecified part of unspecified bronchus or lung: Secondary | ICD-10-CM

## 2014-07-26 MED ORDER — OXYCODONE HCL 10 MG PO TABS: 10.0000 mg | ORAL_TABLET | ORAL | Status: AC | PRN

## 2014-07-26 MED ORDER — OXYCODONE HCL ER 60 MG PO T12A: EXTENDED_RELEASE_TABLET | ORAL | Status: AC

## 2014-07-26 NOTE — Telephone Encounter (Signed)
Received VM in TRIAGE from patient's mother, Judson Roch, stating that patient will need a refill on both Oxycodone 10mg  tablets and OxyContin 60mg  tablets. He will be out of both medications by the weekend. She states that patient has a prescription for OxyContin #10 that she will go ahead and fill but that will only last a few days.  Message routed to [place], [place] for refill.

## 2014-07-28 ENCOUNTER — Telehealth: Payer: Self-pay | Admitting: *Deleted

## 2014-07-28 NOTE — Telephone Encounter (Signed)
Message from patients mom reporting she called earlier for refill of oxycodone and oxycontin.  Would like confirmation that these are ready for pick up as he will run out over the weekend.  Called and voicemail not set up yet..  Called home number [phone], spoke with [name] [name]'s father who says he is the one who will pick up the prescription

## 2014-07-29 ENCOUNTER — Emergency Department (HOSPITAL_COMMUNITY)
Admission: EM | Admit: 2014-07-29 | Discharge: 2014-07-30 | Disposition: A | Discharge status: Home / Self Care | Payer: Medicare Other | Attending: Emergency Medicine | Admitting: Emergency Medicine

## 2014-07-29 ENCOUNTER — Emergency Department (HOSPITAL_COMMUNITY): Payer: Medicare Other

## 2014-07-29 ENCOUNTER — Encounter (HOSPITAL_COMMUNITY): Payer: Self-pay

## 2014-07-29 DIAGNOSIS — D649 Anemia, unspecified: Secondary | ICD-10-CM | POA: Insufficient documentation

## 2014-07-29 DIAGNOSIS — Z8701 Personal history of pneumonia (recurrent): Secondary | ICD-10-CM | POA: Diagnosis not present

## 2014-07-29 DIAGNOSIS — G893 Neoplasm related pain (acute) (chronic): Secondary | ICD-10-CM | POA: Diagnosis not present

## 2014-07-29 DIAGNOSIS — Z7982 Long term (current) use of aspirin: Secondary | ICD-10-CM | POA: Diagnosis not present

## 2014-07-29 DIAGNOSIS — C7951 Secondary malignant neoplasm of bone: Secondary | ICD-10-CM | POA: Diagnosis not present

## 2014-07-29 DIAGNOSIS — T402X5A Adverse effect of other opioids, initial encounter: Secondary | ICD-10-CM | POA: Diagnosis not present

## 2014-07-29 DIAGNOSIS — C7800 Secondary malignant neoplasm of unspecified lung: Secondary | ICD-10-CM | POA: Diagnosis not present

## 2014-07-29 DIAGNOSIS — R1084 Generalized abdominal pain: Secondary | ICD-10-CM

## 2014-07-29 DIAGNOSIS — K5903 Drug induced constipation: Secondary | ICD-10-CM

## 2014-07-29 DIAGNOSIS — K219 Gastro-esophageal reflux disease without esophagitis: Secondary | ICD-10-CM | POA: Diagnosis not present

## 2014-07-29 DIAGNOSIS — K625 Hemorrhage of anus and rectum: Secondary | ICD-10-CM | POA: Diagnosis present

## 2014-07-29 DIAGNOSIS — F319 Bipolar disorder, unspecified: Secondary | ICD-10-CM | POA: Insufficient documentation

## 2014-07-29 DIAGNOSIS — Z79899 Other long term (current) drug therapy: Secondary | ICD-10-CM | POA: Diagnosis not present

## 2014-07-29 DIAGNOSIS — K5900 Constipation, unspecified: Secondary | ICD-10-CM | POA: Insufficient documentation

## 2014-07-29 DIAGNOSIS — Z720 Tobacco use: Secondary | ICD-10-CM | POA: Diagnosis not present

## 2014-07-29 LAB — URINALYSIS, ROUTINE W REFLEX MICROSCOPIC
BILIRUBIN URINE: NEGATIVE
GLUCOSE, UA: NEGATIVE mg/dL
Hgb urine dipstick: NEGATIVE
Ketones, ur: NEGATIVE mg/dL
Nitrite: NEGATIVE
PH: 6 (ref 5.0–8.0)
Protein, ur: NEGATIVE mg/dL
SPECIFIC GRAVITY, URINE: 1.026 (ref 1.005–1.030)
Urobilinogen, UA: 1 mg/dL (ref 0.0–1.0)

## 2014-07-29 LAB — COMPREHENSIVE METABOLIC PANEL
ALK PHOS: 138 U/L — AB (ref 39–117)
ALT: 12 U/L (ref 0–53)
AST: 12 U/L (ref 0–37)
Albumin: 3.5 g/dL (ref 3.5–5.2)
Anion gap: 9 (ref 5–15)
BUN: 9 mg/dL (ref 6–23)
CHLORIDE: 98 mmol/L (ref 96–112)
CO2: 30 mmol/L (ref 19–32)
Calcium: 8.6 mg/dL (ref 8.4–10.5)
Creatinine, Ser: 0.58 mg/dL (ref 0.50–1.35)
GFR calc non Af Amer: >90 mL/min (ref >90)
Glucose, Bld: 107 mg/dL — ABNORMAL HIGH (ref 70–99)
POTASSIUM: 4.4 mmol/L (ref 3.5–5.1)
Sodium: 137 mmol/L (ref 135–145)
Total Bilirubin: 0.6 mg/dL (ref 0.3–1.2)
Total Protein: 7.9 g/dL (ref 6.0–8.3)

## 2014-07-29 LAB — URINE MICROSCOPIC-ADD ON

## 2014-07-29 LAB — POC OCCULT BLOOD, ED: Fecal Occult Bld: NEGATIVE

## 2014-07-29 LAB — CBC
HCT: 39.1 % (ref 39.0–52.0)
HEMOGLOBIN: 11.8 g/dL — AB (ref 13.0–17.0)
MCH: 30.6 pg (ref 26.0–34.0)
MCHC: 30.2 g/dL (ref 30.0–36.0)
MCV: 101.3 fL — AB (ref 78.0–100.0)
PLATELETS: 181 10*3/uL (ref 150–400)
RBC: 3.86 MIL/uL — ABNORMAL LOW (ref 4.22–5.81)
RDW: 18.2 % — AB (ref 11.5–15.5)
WBC: 16.4 10*3/uL — AB (ref 4.0–10.5)

## 2014-07-29 MED ORDER — HYDROMORPHONE HCL 1 MG/ML IJ SOLN
1.0000 mg | Freq: Once | INTRAMUSCULAR | Status: AC
  Administered 2014-07-29: 1 mg via INTRAVENOUS
  Filled 2014-07-29: qty 1

## 2014-07-29 MED ORDER — HYDROMORPHONE HCL 2 MG/ML IJ SOLN
2.0000 mg | Freq: Once | INTRAMUSCULAR | Status: AC
  Administered 2014-07-29: 2 mg via INTRAVENOUS
  Filled 2014-07-29: qty 1

## 2014-07-29 MED ORDER — IOHEXOL 300 MG/ML  SOLN
50.0000 mL | Freq: Once | INTRAMUSCULAR | Status: AC | PRN
  Administered 2014-07-29: 50 mL via ORAL

## 2014-07-29 MED ORDER — IOHEXOL 300 MG/ML  SOLN
100.0000 mL | Freq: Once | INTRAMUSCULAR | Status: AC | PRN
  Administered 2014-07-29: 100 mL via INTRAVENOUS

## 2014-07-29 NOTE — ED Notes (Signed)
Patient has cancer in his spine and lungs. The pain is not new, but the bleeding is.
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
## 2014-07-29 NOTE — ED Notes (Signed)
MD at bedside. 

## 2014-07-29 NOTE — ED Provider Notes (Signed)
Complains of diffuse abdominal pain for approximately 3 days. Complains of low back pain which is chronic for several months, worse over the past few days and complains of left hip pain "since they jerk me around getting x-rays ." Patient denies vomiting. He feels like he cannot pass gas per rectum O he had a slight bowel movement today with a few flecks of blood mixed in. He denies any shortness of breath denies chest pain. No other associated symptoms. On exam alert, chronically ill-appearing lungs scant diffuse rhonchi heart regular rate and rhythm abdomen midline surgical scar, normal active bowel sounds, tender over lower quadrants bilaterally. Genitalia normal male. All 4 extremities without redness swelling or tenderness neurovascular intact. Back without point tenderness. He has pain in lower back when changing positions.  Orlie Dakin, MD 07/29/14 2200

## 2014-07-29 NOTE — ED Notes (Signed)
Patient transported to X-ray 

## 2014-07-29 NOTE — ED Notes (Signed)
Patient reports abdominal and back pain, accompanied by rectal bleeding x 2 days.

## 2014-07-30 ENCOUNTER — Other Ambulatory Visit: Payer: Self-pay | Admitting: Oncology

## 2014-07-30 ENCOUNTER — Encounter (HOSPITAL_COMMUNITY): Payer: Self-pay

## 2014-07-30 DIAGNOSIS — K5900 Constipation, unspecified: Secondary | ICD-10-CM | POA: Diagnosis not present

## 2014-07-30 MED ORDER — HYDROMORPHONE HCL 1 MG/ML IJ SOLN
1.0000 mg | Freq: Once | INTRAMUSCULAR | Status: AC
  Administered 2014-07-30: 1 mg via INTRAVENOUS
  Filled 2014-07-30: qty 1

## 2014-07-30 MED ORDER — HYDROMORPHONE HCL 4 MG PO TABS: 4.0000 mg | ORAL_TABLET | ORAL | Status: AC | PRN

## 2014-07-30 MED ORDER — DOCUSATE SODIUM 100 MG PO CAPS: 100.0000 mg | ORAL_CAPSULE | Freq: Two times a day (BID) | ORAL | Status: AC

## 2014-07-30 NOTE — Discharge Instructions (Signed)
Take the Dilaudid tablets prescribed for severe pain. No driving or operating heavy machinery while taking this drug as it may cause drowsiness. Take Colace daily as directed along with your laxative that you have at home. Call your oncologist on Monday to schedule an appointment as soon as possible for a visit. Constipation Constipation is when a person has fewer than three bowel movements a week, has difficulty having a bowel movement, or has stools that are dry, hard, or larger than normal. As people grow older, constipation is more common. If you try to fix constipation with medicines that make you have a bowel movement (laxatives), the problem may get worse. Long-term laxative use may cause the muscles of the colon to become weak. A low-fiber diet, not taking in enough fluids, and taking certain medicines may make constipation worse.  CAUSES   Certain medicines, such as antidepressants, pain medicine, iron supplements, antacids, and water pills.   Certain diseases, such as diabetes, irritable bowel syndrome (IBS), thyroid disease, or depression.   Not drinking enough water.   Not eating enough fiber-rich foods.   Stress or travel.   Lack of physical activity or exercise.   Ignoring the urge to have a bowel movement.   Using laxatives too much.  SIGNS AND SYMPTOMS   Having fewer than three bowel movements a week.   Straining to have a bowel movement.   Having stools that are hard, dry, or larger than normal.   Feeling full or bloated.   Pain in the lower abdomen.   Not feeling relief after having a bowel movement.  DIAGNOSIS  Your health care provider will take a medical history and perform a physical exam. Further testing may be done for severe constipation. Some tests may include:  A barium enema X-ray to examine your rectum, colon, and, sometimes, your small intestine.   A sigmoidoscopy to examine your lower colon.   A colonoscopy to examine your entire  colon. TREATMENT  Treatment will depend on the severity of your constipation and what is causing it. Some dietary treatments include drinking more fluids and eating more fiber-rich foods. Lifestyle treatments may include regular exercise. If these diet and lifestyle recommendations do not help, your health care provider may recommend taking over-the-counter laxative medicines to help you have bowel movements. Prescription medicines may be prescribed if over-the-counter medicines do not work.  HOME CARE INSTRUCTIONS   Eat foods that have a lot of fiber, such as fruits, vegetables, whole grains, and beans.  Limit foods high in fat and processed sugars, such as french fries, hamburgers, cookies, candies, and soda.   A fiber supplement may be added to your diet if you cannot get enough fiber from foods.   Drink enough fluids to keep your urine clear or pale yellow.   Exercise regularly or as directed by your health care provider.   Go to the restroom when you have the urge to go. Do not hold it.   Only take over-the-counter or prescription medicines as directed by your health care provider. Do not take other medicines for constipation without talking to your health care provider first.  [place] IF:   You have bright red blood in your stool.   Your constipation lasts for more than 4 days or gets worse.   You have abdominal or rectal pain.   You have thin, pencil-like stools.   You have unexplained weight loss. MAKE SURE YOU:   Understand these instructions.  Will watch your  condition.  Will get help right away if you are not doing well or get worse. Document Released: 02/22/2004 Document Revised: 05/31/2013 Document Reviewed: 03/07/2013 [facility] Patient Information 2015 [place], [place]. This information is not intended to replace advice given to you by your health care provider. Make sure you discuss any questions you have with your health care  provider.  Abdominal Pain Many things can cause abdominal pain. Usually, abdominal pain is not caused by a disease and will improve without treatment. It can often be observed and treated at home. Your health care provider will do a physical exam and possibly order blood tests and X-rays to help determine the seriousness of your pain. However, in many cases, more time must pass before a clear cause of the pain can be found. Before that point, your health care provider may not know if you need more testing or further treatment. HOME CARE INSTRUCTIONS  Monitor your abdominal pain for any changes. The following actions may help to alleviate any discomfort you are experiencing:  Only take over-the-counter or prescription medicines as directed by your health care provider.  Do not take laxatives unless directed to do so by your health care provider.  Try a clear liquid diet (broth, tea, or water) as directed by your health care provider. Slowly move to a bland diet as tolerated. SEEK MEDICAL CARE IF:  You have unexplained abdominal pain.  You have abdominal pain associated with nausea or diarrhea.  You have pain when you urinate or have a bowel movement.  You experience abdominal pain that wakes you in the night.  You have abdominal pain that is worsened or improved by eating food.  You have abdominal pain that is worsened with eating fatty foods.  You have a fever. SEEK IMMEDIATE MEDICAL CARE IF:   Your pain does not go away within 2 hours.  You keep throwing up (vomiting).  Your pain is felt only in portions of the abdomen, such as the right side or the left lower portion of the abdomen.  You pass bloody or black tarry stools. MAKE SURE YOU:  Understand these instructions.   Will watch your condition.   Will get help right away if you are not doing well or get worse.  Document Released: 03/05/2005 Document Revised: 05/31/2013 Document Reviewed: 02/02/2013 [facility] Patient  Information 2015 [place], [place]. This information is not intended to replace advice given to you by your health care provider. Make sure you discuss any questions you have with your health care provider.

## 2014-07-31 ENCOUNTER — Telehealth: Payer: Self-pay | Admitting: Medical Oncology

## 2014-07-31 ENCOUNTER — Encounter: Payer: Self-pay | Admitting: *Deleted

## 2014-07-31 NOTE — Telephone Encounter (Signed)
Triage nurse updated me regarding her documentation today. She instructed pts mother to give him the dilaudid. I called pts mother and left message for her to call me to clarify if pt needs anything.

## 2014-07-31 NOTE — Telephone Encounter (Signed)
[name] [name] reports that pt is still having uncontrolled pain on following regimen-oxycontin 120 mg every 12 hours, oxycodone 10 every 3 hours prn ,( but  taking it ATC ) and dilaudid 4mg  every 4 hours prn . I told her if [name] [name] is still having pain an hour after his other pain med she can give him the dilaudid as prescribed and to try this regimen. She asked about hospice and will talk to pts PCP tomorrow. I told her [name] [name] would not be able to continue receiving Nivolumab. She will call me back tomorrow. Note to [place].

## 2014-07-31 NOTE — Progress Notes (Unsigned)
07/31/14 @ 11:20 am:  Mr. [name] [name] called to say that he was in the ED over the weekend with pain and constipation.  He said that he has been taking his Oxycontin 120 mg bid and oxycodone 10 mg q 3 hours, but that has not been controlling the pain in his back and shoulder.  He was given Dilaudid 4 mg x 10 tabs  to use for severe breakthrough pain.  He is still constipated with hard, painful stools x 2 in the last two days.  He was advised to add Colace 100 mg bid to his current regimen of Senokot S 4 pills bid. Spoke with his mother, who manages his medications, and she said that she had been afraid to give him the Dilaudid with his other pain meds.  Advised to go ahead and give him a Dilaudid and keep on schedule with the Oxycontin and oxycodone.  Mr. [name] [name] has an appointment with his primary MD tomorrow and an appointment with Dr. Julien Nordmann on 08/10/14.  Will confer with Dr. Julien Nordmann.

## 2014-08-01 ENCOUNTER — Ambulatory Visit (INDEPENDENT_AMBULATORY_CARE_PROVIDER_SITE_OTHER): Payer: Medicare Other | Admitting: Internal Medicine

## 2014-08-01 ENCOUNTER — Encounter: Payer: Self-pay | Admitting: Internal Medicine

## 2014-08-01 VITALS — BP 120/80 | HR 134 | Temp 98.3°F

## 2014-08-01 DIAGNOSIS — C342 Malignant neoplasm of middle lobe, bronchus or lung: Secondary | ICD-10-CM

## 2014-08-01 DIAGNOSIS — C7949 Secondary malignant neoplasm of other parts of nervous system: Secondary | ICD-10-CM

## 2014-08-01 DIAGNOSIS — C7931 Secondary malignant neoplasm of brain: Secondary | ICD-10-CM

## 2014-08-01 DIAGNOSIS — R69 Illness, unspecified: Secondary | ICD-10-CM

## 2014-08-01 MED ORDER — KETOROLAC TROMETHAMINE 60 MG/2ML IM SOLN
60.0000 mg | Freq: Once | INTRAMUSCULAR | Status: AC
  Administered 2014-08-01: 60 mg via INTRAMUSCULAR

## 2014-08-01 NOTE — Progress Notes (Signed)
   Subjective:    Patient ID: [name] [name], male    DOB: 02-19-1966, 49 y.o.   MRN: [id number]  HPI  He was brought in by his mother to complete Medicare requirements to document need for home oxygen.  While in the hospital he had desaturations into the 70s. I provided the oxygen supply company with the actual record; but I was told that this would need to  be documented here in the office with pulse oximetry trial.  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     Review of Systems He has constant pain in the spine.  He describes diffuse muscle weakness; he states he is unable to lift his legs or ambulate.    Objective:   Physical Exam He appears profoundly weak; diaphoresis is present over the forehead.  He has a resting tach with flow murmur.  He has musical rhonchi in all lung fields. New  Abdomen is protuberant; bowel sounds are active  He has trace edema at the sock line.  Pedal pulses are decreased.  He is tearful, crying that he is in constant pain and requesting pain medication.  He was so weak that his feet had to be lifted back on to the foot boards of the wheelchair.         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 At his specific request; Hospice consultation will be requested

## 2014-08-01 NOTE — Progress Notes (Signed)
Pre visit review using our clinic review tool, if applicable. No additional management support is needed unless otherwise documented below in the visit note. 

## 2014-08-01 NOTE — Patient Instructions (Signed)
Hospice will address any home equipment or services needs. If they will fax an order for such; that request will be signed and returned as soon as possible.

## 2014-08-01 NOTE — Addendum Note (Signed)
Addended by: Ardeen Garland on: 08/01/2014 09:04 AM   Modules accepted: Medications

## 2014-08-01 NOTE — Telephone Encounter (Addendum)
Per Dr Julien Nordmann I told Judson Roch that [facility] is appropriate for hospice and that it is Jahkeem's decision. She stated that Deverick has already decided he wants Hospice.

## 2014-08-02 ENCOUNTER — Telehealth: Payer: Self-pay

## 2014-08-02 NOTE — Telephone Encounter (Signed)
Phone call back to [place]. She states you need to write this out on script and then it can be faxed to [facility] in [place]

## 2014-08-02 NOTE — Telephone Encounter (Signed)
Phone call from [facility]. Patient was admitted for hospice services today. He is in severe pain. Would like to start a pain pump. She spoke with pharmacist. Start Dilaudid 1 mg every hour continuous with 1 mg Bolous every hour prn? Would like to changes Valium to every 6 hours? Please advise.

## 2014-08-02 NOTE — Telephone Encounter (Signed)
Absolutely; let me know what is needed to expedite. Thank you for your expertise & compassion

## 2014-08-03 NOTE — Telephone Encounter (Signed)
Script has been faxed to [facility] in [place]

## 2014-08-10 ENCOUNTER — Ambulatory Visit: Payer: Self-pay | Admitting: Internal Medicine

## 2014-08-10 ENCOUNTER — Other Ambulatory Visit: Payer: Self-pay

## 2014-08-14 ENCOUNTER — Encounter: Payer: Self-pay | Admitting: Internal Medicine

## 2014-08-21 ENCOUNTER — Encounter: Payer: Self-pay | Admitting: Medical Oncology

## 2014-08-21 NOTE — Progress Notes (Signed)
Pt died September 08, 2014

## 2014-09-08 DEATH — deceased

## 2014-09-15 ENCOUNTER — Telehealth: Payer: Self-pay | Admitting: Internal Medicine
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
## 2015-11-27 ENCOUNTER — Other Ambulatory Visit: Payer: Self-pay | Admitting: Nurse Practitioner
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
## 2019-07-01 NOTE — Telephone Encounter (Signed)
error
